# Patient Record
Sex: Male | Born: 1935 | Race: Black or African American | Hispanic: No | Marital: Single | Smoking: Never smoker
Health system: Southern US, Community
[De-identification: ages and names within clinical notes are randomized; demographics above are authoritative.]

## PROBLEM LIST (undated history)

## (undated) DIAGNOSIS — I10 Essential (primary) hypertension: Secondary | ICD-10-CM

## (undated) DIAGNOSIS — M47816 Spondylosis without myelopathy or radiculopathy, lumbar region: Secondary | ICD-10-CM

## (undated) DIAGNOSIS — E349 Endocrine disorder, unspecified: Secondary | ICD-10-CM

## (undated) DIAGNOSIS — I739 Peripheral vascular disease, unspecified: Secondary | ICD-10-CM

## (undated) HISTORY — DX: Essential (primary) hypertension: I10

## (undated) HISTORY — PX: NO PAST SURGERIES: SHX2092

## (undated) HISTORY — DX: Spondylosis without myelopathy or radiculopathy, lumbar region: M47.816

## (undated) HISTORY — DX: Peripheral vascular disease, unspecified: I73.9

## (undated) HISTORY — DX: Endocrine disorder, unspecified: E34.9

---

## 2000-04-23 ENCOUNTER — Encounter: Payer: Self-pay | Admitting: *Deleted

## 2000-04-23 ENCOUNTER — Inpatient Hospital Stay (HOSPITAL_COMMUNITY): Admission: EM | Admit: 2000-04-23 | Discharge: 2000-04-24 | Payer: Self-pay | Admitting: Emergency Medicine

## 2000-04-24 ENCOUNTER — Encounter: Payer: Self-pay | Admitting: *Deleted

## 2000-05-31 ENCOUNTER — Encounter: Payer: Self-pay | Admitting: Endocrinology

## 2000-05-31 ENCOUNTER — Ambulatory Visit (HOSPITAL_COMMUNITY): Admission: RE | Admit: 2000-05-31 | Discharge: 2000-05-31 | Payer: Self-pay | Admitting: Endocrinology

## 2000-06-12 ENCOUNTER — Encounter: Payer: Self-pay | Admitting: Endocrinology

## 2000-06-12 ENCOUNTER — Ambulatory Visit (HOSPITAL_COMMUNITY): Admission: RE | Admit: 2000-06-12 | Discharge: 2000-06-12 | Payer: Self-pay | Admitting: Endocrinology

## 2001-11-07 ENCOUNTER — Encounter: Admission: RE | Admit: 2001-11-07 | Discharge: 2001-11-07 | Payer: Self-pay | Admitting: *Deleted

## 2001-11-07 ENCOUNTER — Encounter: Payer: Self-pay | Admitting: *Deleted

## 2001-11-13 ENCOUNTER — Encounter: Payer: Self-pay | Admitting: Endocrinology

## 2001-11-13 ENCOUNTER — Encounter: Admission: RE | Admit: 2001-11-13 | Discharge: 2001-11-13 | Payer: Self-pay | Admitting: Endocrinology

## 2007-07-19 ENCOUNTER — Ambulatory Visit (HOSPITAL_COMMUNITY): Admission: RE | Admit: 2007-07-19 | Discharge: 2007-07-19 | Payer: Self-pay | Admitting: *Deleted

## 2007-07-19 ENCOUNTER — Encounter (INDEPENDENT_AMBULATORY_CARE_PROVIDER_SITE_OTHER): Payer: Self-pay | Admitting: *Deleted

## 2010-08-16 NOTE — Op Note (Signed)
[name], [name]              ACCOUNT NO.:  [id number]   MEDICAL RECORD NO.:  [id number]          PATIENT TYPE:  AMB   LOCATION:  ENDO                         FACILITY:  [facility]   PHYSICIAN:  Georgiana Spinner, M.D.    DATE OF BIRTH:  [birth year]   DATE OF PROCEDURE:  07/19/2007  DATE OF DISCHARGE:                               OPERATIVE REPORT   PROCEDURE:  Upper endoscopy.   ENDOSCOPIST:  Georgiana Spinner, M.D.   INDICATIONS:  GERD.   ANESTHESIA:  Fentanyl 50 mcg, Versed 4 mg.   PROCEDURE:  With the patient mildly sedated in the left lateral  decubitus position, the Pentax videoscopic endoscope was inserted in the  mouth and passed under direct vision through the esophagus, which  appeared normal, and into the stomach; fundus, body and antrum appeared  normal, as did the duodenal bulb and second portion of duodenum.  From  this point, the endoscope was slowly withdrawn, taking circumferential  views of the duodenal mucosa, until the endoscope had been pulled back  into stomach placed in retroflexion to view the stomach from below.  The  endoscope was straightened and withdrawn, taking circumferential views  of the remaining gastric and esophageal mucosa.  The patient vital signs  and pulse oximetry remained stable.  The patient tolerated procedure  well and without apparent complications.   FINDINGS:  Negative exam.   PLAN:  Proceed to colonoscopy.           ______________________________  Georgiana Spinner, M.D.     GMO/MEDQ  D:  07/19/2007  T:  07/19/2007  Job:  [id number]

## 2010-08-16 NOTE — Op Note (Signed)
John Rose, John Rose              ACCOUNT NO.:  [id number]   MEDICAL RECORD NO.:  [id number]          PATIENT TYPE:  AMB   LOCATION:  ENDO                         FACILITY:  [facility]   PHYSICIAN:  Georgiana Spinner, M.D.    DATE OF BIRTH:  [birth year]   DATE OF PROCEDURE:  DATE OF DISCHARGE:                               OPERATIVE REPORT   PROCEDURE:  Colonoscopy.   ANESTHESIA:  Fentanyl 25 mcg, Versed 1 mg.   INDICATIONS:  Colon polyps   PROCEDURE:  With the patient mildly sedated in the left lateral  decubitus position, the Pentax videoscopic colonoscope was inserted in  the rectum after a limited rectal examination was performed which was  unremarkable.  The endoscope was passed under direct vision with  pressure applied and the patient rolled to his back and subsequently to  his right side. We  were able to reach the cecum identified by ileocecal  valve and appendiceal orifice, both of which were photographed. From  this point, the colonoscope was slowly withdrawn taking circumferential  views of the colonic mucosa stopping only in the ascending colon where  three polyps were seen, the first and third were photographed and each  was removed, the first and third with snare cautery technique, the  middle with hot biopsy forceps technique.  All with a setting of 20/150  blended current.  The first polyp not able to be retrieved, this had  fell into the cecum.  We could not reach back in again to get that, but  the others were retrieved and the endoscope was then withdrawn taking  circumferential views of the remaining colonic mucosa stopping in the  rectum which appeared normal on direct and showed hemorrhoids on  retroflexed view. The endoscope was straightened and withdrawn.  The  patient's vital signs and pulse oximeter remained stable.  The patient  tolerated the procedure well without apparent complications.   FINDINGS:  Internal hemorrhoids.  Polyps of the ascending colon all  removed.  Await biopsy reports.  The patient will call me for results  and follow-up with me as an outpatient.           ______________________________  Georgiana Spinner, M.D.     GMO/MEDQ  D:  07/19/2007  T:  07/19/2007  Job:  [id number]

## 2010-08-19 NOTE — H&P (Signed)
[place]  Patient:    John Rose, John Rose                       MRN: [id number] Adm. Date:  [id number] Attending:  Meade Maw A Dictator:   Anselm Lis, N.P. CC:         Eula Listen, M.D.                         History and Physical  DATE OF BIRTH:  [birth year]  CHIEF COMPLAINT:  (As dictated by Dr. Meade Maw):  Chest pain, atypical, with associated shortness of breath and fatigue.  Onset of symptoms three days prior to admission.  His first CPK is elevated at 1000, but negative significant MB fraction and negative troponin I.  His electrocardiogram is nonischemic.  His cardiac risk factors are male sex, age, dyslipidemia.  He is currently pain-free.  His chest discomfort is experienced as a mid-chest burning which he attributes to reflux, and occurs postprandial.  He has a history of benign prostatic hypertrophy and gastroesophageal reflux disease, thrombocytopenia with leukopenia, with WBC of 3.7 with platelets decreased at 135, will monitor for now, and dyslipidemia.  Question asthma with expiratory wheezing on examination.  The patient denies a history of asthma, cancer, diabetes mellitus, or hypertension.  PLAN:  (As dictated by Dr. Fraser Din): 1. Admit to telemetry.  Rule out myocardial infarction protocol with    serial cardiac enzymes and daily electrocardiogram.  Will initiate    aspirin and possibly topical nitrates. 2. Stress Cardiolyte in the morning if he rules out. 3. Will initiate Xopenex 1.25 mg q.6h.  HISTORY OF PRESENT ILLNESS:  John Rose is a very pleasant 77 year old male, with a history of dyslipidemia, who has a history of postprandial epigastric soreness for approximately one year if he eats greasy foods.  Three days prior to admission he developed progressive shortness of breath/fatigue, and more constant epigastric soreness, with associated palpitations.  He mentioned symptoms of orthopnea over this  weekend as well.  He developed fatigue to the point where it was difficult to dress himself for church yesterday morning. He presented to [facility], who referred the patient to the [facility] Emergency Room.  His electrocardiogram was nonischemic. His first set of troponin Is were negative.  The CPK was elevated without significant MB fraction.  He is currently pain-free unless he coughs, then precipitates epigastric soreness.  PAST SURGICAL HISTORY:  Negative.  ALLERGIES:  No known drug allergies.  CURRENT MEDICATIONS:  Lipitor 10 mg p.o. q.d.  SOCIAL HISTORY/HABITS:  The patient is single.  He has one son alive and well. He works as a Lawyer.  FAMILY HISTORY:  Mother died at age 88.  Father died at age 58.  No coronary artery disease.  One sister died in her 37s.  One sister died at age 63 of unknown causes.  One sister died in her 43s.  One sister died at age 81 of cancer.  One brother and a sister are alive and well.  REVIEW OF SYSTEMS:  As in the HPI and past medical history, otherwise essentially benign.  Does wear glasses.  He is a little hard of hearing. Denies melena, diarrhea, but does have episodic constipation.  Negative bright red blood per rectum.  Denies dysuria or hematuria.  Has had a productive cough of yellow secretions.  PHYSICAL EXAMINATION:  (As performed  by Dr. Meade Maw):  VITAL SIGNS:  Blood pressure 122/80, heart rate tachy at 102.  Temperature 99.6, room air O2 saturation 94%.  GENERAL:  He is a well-nourished gentleman, looking younger than his 77 years. He is alert and without distress.  HEENT/NECK:  Brisk bilateral carotid upstrokes without bruit.  No significant jugular venous distention, no thyromegaly.  LUNGS:  Prolonged expiratory phase with some expiratory wheezing throughout.  CARDIAC:  A regular rate and rhythm without murmur, rub, or gallop.  Normal S1, S2.  ABDOMEN:  Soft, nondistended.   Normoactive bowel sounds.  Negative abdominal aortic, renal, or femoral bruits.  EXTREMITIES:  Distal pulses intact.  Negative pedal edema.  NEUROLOGIC:  Cranial nerves II-XII grossly intact.  Alert and oriented x 3.  GENITOURINARY:  Deferred.  RECTAL:  Deferred.  LABORATORY DATA:  Hemoglobin 15.3, with hematocrit of 45.6, WBC 3.7, platelets decreased at 135.  CPK 1003, with MB fraction 6.5, troponin 0.01.  ABG reveals a pH of 7.42, PACO2 of 36, PAO2 of 67%, O2 saturation 95%.  Electrocardiogram revealed normal sinus rhythm without ischemic changes. There is a QS pattern in V1 and V2, suspicious for an old septal myocardial infarction. DD:  04/24/00 TD:  04/24/00 Job: [id number]

## 2011-09-06 DIAGNOSIS — E291 Testicular hypofunction: Secondary | ICD-10-CM | POA: Diagnosis not present

## 2011-09-06 DIAGNOSIS — E789 Disorder of lipoprotein metabolism, unspecified: Secondary | ICD-10-CM | POA: Diagnosis not present

## 2011-09-06 DIAGNOSIS — Z125 Encounter for screening for malignant neoplasm of prostate: Secondary | ICD-10-CM | POA: Diagnosis not present

## 2011-09-13 DIAGNOSIS — E789 Disorder of lipoprotein metabolism, unspecified: Secondary | ICD-10-CM | POA: Diagnosis not present

## 2011-09-13 DIAGNOSIS — M79609 Pain in unspecified limb: Secondary | ICD-10-CM | POA: Diagnosis not present

## 2011-09-13 DIAGNOSIS — E291 Testicular hypofunction: Secondary | ICD-10-CM | POA: Diagnosis not present

## 2011-09-13 DIAGNOSIS — N400 Enlarged prostate without lower urinary tract symptoms: Secondary | ICD-10-CM | POA: Diagnosis not present

## 2011-11-13 DIAGNOSIS — E291 Testicular hypofunction: Secondary | ICD-10-CM | POA: Diagnosis not present

## 2011-11-17 DIAGNOSIS — E291 Testicular hypofunction: Secondary | ICD-10-CM | POA: Diagnosis not present

## 2011-11-17 DIAGNOSIS — N400 Enlarged prostate without lower urinary tract symptoms: Secondary | ICD-10-CM | POA: Diagnosis not present

## 2011-11-17 DIAGNOSIS — E789 Disorder of lipoprotein metabolism, unspecified: Secondary | ICD-10-CM | POA: Diagnosis not present

## 2012-02-15 DIAGNOSIS — E291 Testicular hypofunction: Secondary | ICD-10-CM | POA: Diagnosis not present

## 2012-02-15 DIAGNOSIS — E789 Disorder of lipoprotein metabolism, unspecified: Secondary | ICD-10-CM | POA: Diagnosis not present

## 2012-02-22 DIAGNOSIS — N400 Enlarged prostate without lower urinary tract symptoms: Secondary | ICD-10-CM | POA: Diagnosis not present

## 2012-02-22 DIAGNOSIS — E291 Testicular hypofunction: Secondary | ICD-10-CM | POA: Diagnosis not present

## 2012-02-22 DIAGNOSIS — E789 Disorder of lipoprotein metabolism, unspecified: Secondary | ICD-10-CM | POA: Diagnosis not present

## 2012-04-05 DIAGNOSIS — N400 Enlarged prostate without lower urinary tract symptoms: Secondary | ICD-10-CM | POA: Diagnosis not present

## 2012-04-05 DIAGNOSIS — E789 Disorder of lipoprotein metabolism, unspecified: Secondary | ICD-10-CM | POA: Diagnosis not present

## 2012-04-05 DIAGNOSIS — IMO0002 Reserved for concepts with insufficient information to code with codable children: Secondary | ICD-10-CM | POA: Diagnosis not present

## 2012-06-13 DIAGNOSIS — E291 Testicular hypofunction: Secondary | ICD-10-CM | POA: Diagnosis not present

## 2012-06-20 DIAGNOSIS — E291 Testicular hypofunction: Secondary | ICD-10-CM | POA: Diagnosis not present

## 2012-09-09 DIAGNOSIS — Z79899 Other long term (current) drug therapy: Secondary | ICD-10-CM | POA: Diagnosis not present

## 2012-09-09 DIAGNOSIS — E291 Testicular hypofunction: Secondary | ICD-10-CM | POA: Diagnosis not present

## 2012-09-09 DIAGNOSIS — Z125 Encounter for screening for malignant neoplasm of prostate: Secondary | ICD-10-CM | POA: Diagnosis not present

## 2012-09-09 DIAGNOSIS — E789 Disorder of lipoprotein metabolism, unspecified: Secondary | ICD-10-CM | POA: Diagnosis not present

## 2012-09-16 DIAGNOSIS — N400 Enlarged prostate without lower urinary tract symptoms: Secondary | ICD-10-CM | POA: Diagnosis not present

## 2012-09-16 DIAGNOSIS — E789 Disorder of lipoprotein metabolism, unspecified: Secondary | ICD-10-CM | POA: Diagnosis not present

## 2012-09-16 DIAGNOSIS — E291 Testicular hypofunction: Secondary | ICD-10-CM | POA: Diagnosis not present

## 2013-03-11 DIAGNOSIS — E291 Testicular hypofunction: Secondary | ICD-10-CM | POA: Diagnosis not present

## 2013-03-11 DIAGNOSIS — E789 Disorder of lipoprotein metabolism, unspecified: Secondary | ICD-10-CM | POA: Diagnosis not present

## 2013-03-11 DIAGNOSIS — N189 Chronic kidney disease, unspecified: Secondary | ICD-10-CM | POA: Diagnosis not present

## 2013-03-18 DIAGNOSIS — N400 Enlarged prostate without lower urinary tract symptoms: Secondary | ICD-10-CM | POA: Diagnosis not present

## 2013-03-18 DIAGNOSIS — Z23 Encounter for immunization: Secondary | ICD-10-CM | POA: Diagnosis not present

## 2013-03-18 DIAGNOSIS — E789 Disorder of lipoprotein metabolism, unspecified: Secondary | ICD-10-CM | POA: Diagnosis not present

## 2013-03-18 DIAGNOSIS — E291 Testicular hypofunction: Secondary | ICD-10-CM | POA: Diagnosis not present

## 2013-04-08 ENCOUNTER — Ambulatory Visit (INDEPENDENT_AMBULATORY_CARE_PROVIDER_SITE_OTHER): Payer: Medicare Other | Admitting: Diagnostic Neuroimaging

## 2013-04-08 ENCOUNTER — Ambulatory Visit (INDEPENDENT_AMBULATORY_CARE_PROVIDER_SITE_OTHER): Payer: Self-pay | Admitting: Radiology

## 2013-04-08 DIAGNOSIS — R202 Paresthesia of skin: Principal | ICD-10-CM

## 2013-04-08 DIAGNOSIS — G5600 Carpal tunnel syndrome, unspecified upper limb: Secondary | ICD-10-CM

## 2013-04-08 DIAGNOSIS — Z0289 Encounter for other administrative examinations: Secondary | ICD-10-CM

## 2013-04-08 DIAGNOSIS — R200 Anesthesia of skin: Secondary | ICD-10-CM

## 2013-04-08 DIAGNOSIS — R209 Unspecified disturbances of skin sensation: Secondary | ICD-10-CM

## 2013-04-09 NOTE — Procedures (Signed)
   [facility]  NCS (NERVE CONDUCTION STUDY) WITH EMG (ELECTROMYOGRAPHY) REPORT   STUDY DATE: 04/08/13 PATIENT NAME: John Rose DOB: 08/05/1922 MRN: [id number]  ORDERING CLINICIAN: Annice PihW Kohut MD  TECHNOLOGIST: Kaylyn LimSue Fox ELECTROMYOGRAPHER: Glenford BayleyVikram R. Tamar Lipscomb, MD  CLINICAL INFORMATION: 80 year old male with left greater than right hand numbness and tingling, for past 1 month. Denies any neck pain.  FINDINGS: NERVE CONDUCTION STUDY: Bilateral median motor responses have prolonged distal latencies (right 7.5 ms, left 7.5 ms, normal less than or 4.2 ms), normal amplitudes, normal conduction velocities and prolonged F-wave latencies. Bilateral ulnar motor responses have normal distal latencies, amplitudes, conduction velocities. Right ulnar F wave latency is slightly prolonged. Left ulnar F wave latency is normal. In  Right median sensory response has decreased amplitude and slow conduction velocity (20 m/s). Left median sensory response could not be obtained. Bilateral ulnar and left radial sensory responses have decreased amplitudes and normal conduction velocities. Right radial sensory response is normal.  NEEDLE ELECTROMYOGRAPHY: Needle examination of selected muscles of the left upper extremity (deltoid, biceps, triceps, flexor carpi radialis, first dorsal interosseous) shows no abnormal spontaneous activity at rest and normal motor unit recruitment on exertion.  IMPRESSION:  Abnormal study demonstrating: 1. Bilateral median neuropathies at the wrists (left worse than right) consistent with bilateral carpal tunnel syndrome. 2. Mild abnormalities of bilateral ulnar and left radial sensory responses (with decreased amplitudes and normal conduction velocities) raises possibility of mild concomitant underlying sensory polyneuropathy.   INTERPRETING PHYSICIAN:  Suanne MarkerVIKRAM R. Namish Krise, MD Certified in Neurology, Neurophysiology and Neuroimaging  [facility] [address], [address] [place], [place] [address] [phone]

## 2013-04-28 DIAGNOSIS — R209 Unspecified disturbances of skin sensation: Secondary | ICD-10-CM | POA: Diagnosis not present

## 2013-04-28 DIAGNOSIS — G5600 Carpal tunnel syndrome, unspecified upper limb: Secondary | ICD-10-CM | POA: Diagnosis not present

## 2013-04-28 DIAGNOSIS — E291 Testicular hypofunction: Secondary | ICD-10-CM | POA: Diagnosis not present

## 2013-04-28 DIAGNOSIS — E789 Disorder of lipoprotein metabolism, unspecified: Secondary | ICD-10-CM | POA: Diagnosis not present

## 2013-04-29 DIAGNOSIS — R209 Unspecified disturbances of skin sensation: Secondary | ICD-10-CM | POA: Diagnosis not present

## 2013-04-30 DIAGNOSIS — G5600 Carpal tunnel syndrome, unspecified upper limb: Secondary | ICD-10-CM | POA: Diagnosis not present

## 2013-05-12 DIAGNOSIS — E789 Disorder of lipoprotein metabolism, unspecified: Secondary | ICD-10-CM | POA: Diagnosis not present

## 2013-05-12 DIAGNOSIS — G589 Mononeuropathy, unspecified: Secondary | ICD-10-CM | POA: Diagnosis not present

## 2013-05-26 DIAGNOSIS — G589 Mononeuropathy, unspecified: Secondary | ICD-10-CM | POA: Diagnosis not present

## 2013-06-09 DIAGNOSIS — E789 Disorder of lipoprotein metabolism, unspecified: Secondary | ICD-10-CM | POA: Diagnosis not present

## 2013-06-09 DIAGNOSIS — G589 Mononeuropathy, unspecified: Secondary | ICD-10-CM | POA: Diagnosis not present

## 2013-06-09 DIAGNOSIS — E291 Testicular hypofunction: Secondary | ICD-10-CM | POA: Diagnosis not present

## 2013-07-25 DIAGNOSIS — G5600 Carpal tunnel syndrome, unspecified upper limb: Secondary | ICD-10-CM | POA: Diagnosis not present

## 2013-08-21 DIAGNOSIS — Z79899 Other long term (current) drug therapy: Secondary | ICD-10-CM | POA: Diagnosis not present

## 2013-08-21 DIAGNOSIS — L708 Other acne: Secondary | ICD-10-CM | POA: Diagnosis not present

## 2013-08-21 DIAGNOSIS — R609 Edema, unspecified: Secondary | ICD-10-CM | POA: Diagnosis not present

## 2013-09-10 DIAGNOSIS — Z125 Encounter for screening for malignant neoplasm of prostate: Secondary | ICD-10-CM | POA: Diagnosis not present

## 2013-09-10 DIAGNOSIS — Z79899 Other long term (current) drug therapy: Secondary | ICD-10-CM | POA: Diagnosis not present

## 2013-09-10 DIAGNOSIS — N189 Chronic kidney disease, unspecified: Secondary | ICD-10-CM | POA: Diagnosis not present

## 2013-09-10 DIAGNOSIS — E789 Disorder of lipoprotein metabolism, unspecified: Secondary | ICD-10-CM | POA: Diagnosis not present

## 2013-09-17 DIAGNOSIS — E789 Disorder of lipoprotein metabolism, unspecified: Secondary | ICD-10-CM | POA: Diagnosis not present

## 2013-09-17 DIAGNOSIS — R609 Edema, unspecified: Secondary | ICD-10-CM | POA: Diagnosis not present

## 2013-09-17 DIAGNOSIS — D649 Anemia, unspecified: Secondary | ICD-10-CM | POA: Diagnosis not present

## 2013-09-17 DIAGNOSIS — E291 Testicular hypofunction: Secondary | ICD-10-CM | POA: Diagnosis not present

## 2013-09-17 DIAGNOSIS — G589 Mononeuropathy, unspecified: Secondary | ICD-10-CM | POA: Diagnosis not present

## 2013-10-21 DIAGNOSIS — R609 Edema, unspecified: Secondary | ICD-10-CM | POA: Diagnosis not present

## 2013-10-21 DIAGNOSIS — M169 Osteoarthritis of hip, unspecified: Secondary | ICD-10-CM | POA: Diagnosis not present

## 2013-10-21 DIAGNOSIS — E789 Disorder of lipoprotein metabolism, unspecified: Secondary | ICD-10-CM | POA: Diagnosis not present

## 2013-10-21 DIAGNOSIS — M1610 Unilateral primary osteoarthritis, unspecified hip: Secondary | ICD-10-CM | POA: Diagnosis not present

## 2013-10-21 DIAGNOSIS — M79609 Pain in unspecified limb: Secondary | ICD-10-CM | POA: Diagnosis not present

## 2014-01-08 DIAGNOSIS — E291 Testicular hypofunction: Secondary | ICD-10-CM | POA: Diagnosis not present

## 2014-01-08 DIAGNOSIS — R202 Paresthesia of skin: Secondary | ICD-10-CM | POA: Diagnosis not present

## 2014-01-08 DIAGNOSIS — E789 Disorder of lipoprotein metabolism, unspecified: Secondary | ICD-10-CM | POA: Diagnosis not present

## 2014-01-15 DIAGNOSIS — E291 Testicular hypofunction: Secondary | ICD-10-CM | POA: Diagnosis not present

## 2014-01-15 DIAGNOSIS — E789 Disorder of lipoprotein metabolism, unspecified: Secondary | ICD-10-CM | POA: Diagnosis not present

## 2014-01-15 DIAGNOSIS — M1990 Unspecified osteoarthritis, unspecified site: Secondary | ICD-10-CM | POA: Diagnosis not present

## 2014-05-12 DIAGNOSIS — E789 Disorder of lipoprotein metabolism, unspecified: Secondary | ICD-10-CM | POA: Diagnosis not present

## 2014-05-19 DIAGNOSIS — E789 Disorder of lipoprotein metabolism, unspecified: Secondary | ICD-10-CM | POA: Diagnosis not present

## 2014-05-19 DIAGNOSIS — E291 Testicular hypofunction: Secondary | ICD-10-CM | POA: Diagnosis not present

## 2014-06-02 DIAGNOSIS — L723 Sebaceous cyst: Secondary | ICD-10-CM | POA: Diagnosis not present

## 2014-09-14 DIAGNOSIS — D649 Anemia, unspecified: Secondary | ICD-10-CM | POA: Diagnosis not present

## 2014-09-14 DIAGNOSIS — Z79899 Other long term (current) drug therapy: Secondary | ICD-10-CM | POA: Diagnosis not present

## 2014-09-14 DIAGNOSIS — Z125 Encounter for screening for malignant neoplasm of prostate: Secondary | ICD-10-CM | POA: Diagnosis not present

## 2014-09-14 DIAGNOSIS — E756 Lipid storage disorder, unspecified: Secondary | ICD-10-CM | POA: Diagnosis not present

## 2014-09-22 DIAGNOSIS — N400 Enlarged prostate without lower urinary tract symptoms: Secondary | ICD-10-CM | POA: Diagnosis not present

## 2014-09-22 DIAGNOSIS — G629 Polyneuropathy, unspecified: Secondary | ICD-10-CM | POA: Diagnosis not present

## 2014-09-22 DIAGNOSIS — E789 Disorder of lipoprotein metabolism, unspecified: Secondary | ICD-10-CM | POA: Diagnosis not present

## 2014-09-22 DIAGNOSIS — E291 Testicular hypofunction: Secondary | ICD-10-CM | POA: Diagnosis not present

## 2014-12-15 DIAGNOSIS — E789 Disorder of lipoprotein metabolism, unspecified: Secondary | ICD-10-CM | POA: Diagnosis not present

## 2014-12-15 DIAGNOSIS — E291 Testicular hypofunction: Secondary | ICD-10-CM | POA: Diagnosis not present

## 2014-12-15 DIAGNOSIS — N400 Enlarged prostate without lower urinary tract symptoms: Secondary | ICD-10-CM | POA: Diagnosis not present

## 2014-12-22 DIAGNOSIS — G629 Polyneuropathy, unspecified: Secondary | ICD-10-CM | POA: Diagnosis not present

## 2014-12-22 DIAGNOSIS — M25579 Pain in unspecified ankle and joints of unspecified foot: Secondary | ICD-10-CM | POA: Diagnosis not present

## 2014-12-22 DIAGNOSIS — E789 Disorder of lipoprotein metabolism, unspecified: Secondary | ICD-10-CM | POA: Diagnosis not present

## 2014-12-23 ENCOUNTER — Other Ambulatory Visit: Payer: Self-pay | Admitting: Endocrinology

## 2014-12-23 ENCOUNTER — Ambulatory Visit (HOSPITAL_COMMUNITY)
Admission: RE | Admit: 2014-12-23 | Discharge: 2014-12-23 | Disposition: A | Discharge status: Home / Self Care | Payer: Medicare Other | Source: Ambulatory Visit | Attending: Vascular Surgery | Admitting: Vascular Surgery

## 2014-12-23 DIAGNOSIS — R609 Edema, unspecified: Secondary | ICD-10-CM | POA: Diagnosis not present

## 2015-01-20 DIAGNOSIS — G629 Polyneuropathy, unspecified: Secondary | ICD-10-CM | POA: Diagnosis not present

## 2015-01-20 DIAGNOSIS — Z23 Encounter for immunization: Secondary | ICD-10-CM | POA: Diagnosis not present

## 2015-01-20 DIAGNOSIS — N390 Urinary tract infection, site not specified: Secondary | ICD-10-CM | POA: Diagnosis not present

## 2015-04-27 DIAGNOSIS — N400 Enlarged prostate without lower urinary tract symptoms: Secondary | ICD-10-CM | POA: Diagnosis not present

## 2015-04-27 DIAGNOSIS — E789 Disorder of lipoprotein metabolism, unspecified: Secondary | ICD-10-CM | POA: Diagnosis not present

## 2015-04-27 DIAGNOSIS — E291 Testicular hypofunction: Secondary | ICD-10-CM | POA: Diagnosis not present

## 2015-06-10 ENCOUNTER — Ambulatory Visit (INDEPENDENT_AMBULATORY_CARE_PROVIDER_SITE_OTHER): Payer: Medicare Other | Admitting: Diagnostic Neuroimaging

## 2015-06-10 ENCOUNTER — Encounter (INDEPENDENT_AMBULATORY_CARE_PROVIDER_SITE_OTHER): Payer: Self-pay | Admitting: Diagnostic Neuroimaging

## 2015-06-10 DIAGNOSIS — G5602 Carpal tunnel syndrome, left upper limb: Secondary | ICD-10-CM | POA: Diagnosis not present

## 2015-06-10 DIAGNOSIS — G5603 Carpal tunnel syndrome, bilateral upper limbs: Secondary | ICD-10-CM

## 2015-06-10 DIAGNOSIS — G5601 Carpal tunnel syndrome, right upper limb: Secondary | ICD-10-CM | POA: Diagnosis not present

## 2015-06-10 DIAGNOSIS — Z0289 Encounter for other administrative examinations: Secondary | ICD-10-CM

## 2015-06-11 NOTE — Procedures (Signed)
   [facility]  NCS (NERVE CONDUCTION STUDY) WITH EMG (ELECTROMYOGRAPHY) REPORT   STUDY DATE: 06/10/15 PATIENT NAME: John Rose DOB: 08/25/1922 MRN: [id number]  ORDERING CLINICIAN: Brooke BonitoW D Kohut  TECHNOLOGIST: Gearldine ShownLorraine Jones  ELECTROMYOGRAPHER: Glenford BayleyVikram R. Hiliary Osorto, MD  CLINICAL INFORMATION: 82 year old male with bilateral hand numbness. Patient denies any neck pain or radiating symptoms into the arms from the neck.   FINDINGS: NERVE CONDUCTION STUDY: Bilateral median motor responses have prolonged distal latencies (right 9.1 ms, left 9.5 ms), normal amplitudes, normal conduction velocities and prolonged F-wave latencies. Bilateral ulnar motor responses and F wave latencies are normal. Bilateral median sensory responses could not be obtained. Bilateral ulnar sensory responses are normal.   NEEDLE ELECTROMYOGRAPHY: Needle examination of left upper extremity deltoid, biceps, triceps, flexor carpi radialis, first dorsal interosseous muscles is normal.   IMPRESSION:  Abnormal study demonstrating: - Moderate to severe bilateral median neuropathies at the wrist consistent with bilateral carpal tunnel syndrome.    INTERPRETING PHYSICIAN:  Suanne MarkerVIKRAM R. Arben Packman, MD Certified in Neurology, Neurophysiology and Neuroimaging  [facility] [address], [address] [place], [place] [address] [phone]

## 2015-09-21 DIAGNOSIS — E789 Disorder of lipoprotein metabolism, unspecified: Secondary | ICD-10-CM | POA: Diagnosis not present

## 2015-09-21 DIAGNOSIS — R202 Paresthesia of skin: Secondary | ICD-10-CM | POA: Diagnosis not present

## 2015-09-21 DIAGNOSIS — Z79899 Other long term (current) drug therapy: Secondary | ICD-10-CM | POA: Diagnosis not present

## 2015-09-21 DIAGNOSIS — E291 Testicular hypofunction: Secondary | ICD-10-CM | POA: Diagnosis not present

## 2015-09-21 DIAGNOSIS — D649 Anemia, unspecified: Secondary | ICD-10-CM | POA: Diagnosis not present

## 2015-09-28 DIAGNOSIS — N400 Enlarged prostate without lower urinary tract symptoms: Secondary | ICD-10-CM | POA: Diagnosis not present

## 2015-09-28 DIAGNOSIS — E789 Disorder of lipoprotein metabolism, unspecified: Secondary | ICD-10-CM | POA: Diagnosis not present

## 2015-09-28 DIAGNOSIS — E291 Testicular hypofunction: Secondary | ICD-10-CM | POA: Diagnosis not present

## 2015-09-28 DIAGNOSIS — G629 Polyneuropathy, unspecified: Secondary | ICD-10-CM | POA: Diagnosis not present

## 2015-11-21 DIAGNOSIS — Z23 Encounter for immunization: Secondary | ICD-10-CM | POA: Diagnosis not present

## 2015-12-03 ENCOUNTER — Other Ambulatory Visit: Payer: Self-pay

## 2016-01-20 DIAGNOSIS — E789 Disorder of lipoprotein metabolism, unspecified: Secondary | ICD-10-CM | POA: Diagnosis not present

## 2016-01-20 DIAGNOSIS — D649 Anemia, unspecified: Secondary | ICD-10-CM | POA: Diagnosis not present

## 2016-01-27 DIAGNOSIS — N400 Enlarged prostate without lower urinary tract symptoms: Secondary | ICD-10-CM | POA: Diagnosis not present

## 2016-01-27 DIAGNOSIS — G5600 Carpal tunnel syndrome, unspecified upper limb: Secondary | ICD-10-CM | POA: Diagnosis not present

## 2016-01-27 DIAGNOSIS — E789 Disorder of lipoprotein metabolism, unspecified: Secondary | ICD-10-CM | POA: Diagnosis not present

## 2016-05-23 DIAGNOSIS — E789 Disorder of lipoprotein metabolism, unspecified: Secondary | ICD-10-CM | POA: Diagnosis not present

## 2016-05-30 DIAGNOSIS — G5600 Carpal tunnel syndrome, unspecified upper limb: Secondary | ICD-10-CM | POA: Diagnosis not present

## 2016-05-30 DIAGNOSIS — E291 Testicular hypofunction: Secondary | ICD-10-CM | POA: Diagnosis not present

## 2016-05-30 DIAGNOSIS — E789 Disorder of lipoprotein metabolism, unspecified: Secondary | ICD-10-CM | POA: Diagnosis not present

## 2016-09-05 DIAGNOSIS — E789 Disorder of lipoprotein metabolism, unspecified: Secondary | ICD-10-CM | POA: Diagnosis not present

## 2016-09-05 DIAGNOSIS — M5136 Other intervertebral disc degeneration, lumbar region: Secondary | ICD-10-CM | POA: Diagnosis not present

## 2016-09-05 DIAGNOSIS — N400 Enlarged prostate without lower urinary tract symptoms: Secondary | ICD-10-CM | POA: Diagnosis not present

## 2016-09-05 DIAGNOSIS — M549 Dorsalgia, unspecified: Secondary | ICD-10-CM | POA: Diagnosis not present

## 2016-09-05 DIAGNOSIS — E291 Testicular hypofunction: Secondary | ICD-10-CM | POA: Diagnosis not present

## 2016-09-05 DIAGNOSIS — M545 Low back pain: Secondary | ICD-10-CM | POA: Diagnosis not present

## 2016-09-05 DIAGNOSIS — T887XXA Unspecified adverse effect of drug or medicament, initial encounter: Secondary | ICD-10-CM | POA: Diagnosis not present

## 2016-09-05 DIAGNOSIS — M6281 Muscle weakness (generalized): Secondary | ICD-10-CM | POA: Diagnosis not present

## 2016-09-20 DIAGNOSIS — N400 Enlarged prostate without lower urinary tract symptoms: Secondary | ICD-10-CM | POA: Diagnosis not present

## 2016-09-20 DIAGNOSIS — G629 Polyneuropathy, unspecified: Secondary | ICD-10-CM | POA: Diagnosis not present

## 2016-09-20 DIAGNOSIS — E789 Disorder of lipoprotein metabolism, unspecified: Secondary | ICD-10-CM | POA: Diagnosis not present

## 2016-10-27 DIAGNOSIS — M545 Low back pain: Secondary | ICD-10-CM | POA: Diagnosis not present

## 2016-10-27 DIAGNOSIS — R634 Abnormal weight loss: Secondary | ICD-10-CM | POA: Diagnosis not present

## 2016-11-10 ENCOUNTER — Encounter: Payer: Self-pay | Admitting: Neurology

## 2016-11-10 ENCOUNTER — Ambulatory Visit (INDEPENDENT_AMBULATORY_CARE_PROVIDER_SITE_OTHER): Payer: Medicare Other | Admitting: Neurology

## 2016-11-10 DIAGNOSIS — M47816 Spondylosis without myelopathy or radiculopathy, lumbar region: Secondary | ICD-10-CM | POA: Diagnosis not present

## 2016-11-10 HISTORY — DX: Spondylosis without myelopathy or radiculopathy, lumbar region: M47.816

## 2016-11-10 NOTE — Patient Instructions (Signed)
   We will try a lumbar support device to help the low back discomfort when you are standing for long periods of time.

## 2016-11-10 NOTE — Progress Notes (Signed)
Reason for visit: Weakness  Referring physician: Dr. [name] is a 83 y.o. male  History of present illness:  John Rose is a 83 year old right-handed black male with a history of chronic renal insufficiency and mild pancytopenia. The patient is sent over today for an evaluation of a several month history of reports of feeling weak in the low back with standing. The patient indicates that he is stooped with his posture, if he stands too long he has a discomfort in the low back and he feels that he needs to sit down. The patient denies any pain down the legs on either side, he does not believe there is any true weakness of the legs. He is able to ambulate fairly well when he uses a cane. He does not have the sensation of as much discomfort in the back when he is walking. The patient denies any neck pain or pain down the arms, he denies changes controlling the bowels or the bladder. He has not had any falls. He has noted his legs may swell at times, he is sent to this office for further evaluation. He has undergone a recent x-ray of the abdomen and pelvis that was done on 09/05/2016 that showed moderate to advanced degenerative changes in the hips left greater than right.  Past Medical History:  Diagnosis Date  . Lumbar spondylosis 11/10/2016  . Testosterone deficiency     Past Surgical History:  Procedure Laterality Date  . NO PAST SURGERIES      History reviewed. No pertinent family history.  Social history:  reports that he has never smoked. He has never used smokeless tobacco. He reports that he does not drink alcohol or use drugs.  Medications:  Prior to Admission medications   Medication Sig Start Date End Date Taking? Authorizing Provider  acetaminophen (TYLENOL) 650 MG CR tablet Take 650 mg by mouth every 8 (eight) hours as needed for pain.   Yes [provider]  ANDROGEL PUMP 20.25 MG/ACT (1.62%) GEL Apply 1 Dose topically 4 (four) times daily.  10/25/16   Yes [provider]  furosemide (LASIX) 40 MG tablet Take 20 mg by mouth daily as needed. Take 1-2 tablets if needed   Yes [provider]  gabapentin (NEURONTIN) 300 MG capsule Take 300 mg by mouth 2 (two) times daily. 10/23/16  Yes [provider]  meloxicam (MOBIC) 15 MG tablet take 1 tablet by mouth once daily if needed for pain 10/23/16  Yes [provider]  methocarbamol (ROBAXIN) 500 MG tablet take 1 tablet by mouth twice a day if needed 10/27/16  Yes [provider]  tamsulosin (FLOMAX) 0.4 MG CAPS capsule Take 0.4 mg by mouth daily.  08/16/16  Yes [provider]     No Known Allergies  ROS:  Out of a complete 14 system review of symptoms, the patient complains only of the following symptoms, and all other reviewed systems are negative.  Weight loss Swelling in the legs Hearing loss  Blood pressure 121/70, pulse 79, height 5\' 8"  (1.727 m), weight 166 lb (75.3 kg), SpO2 98 %.  Physical Exam  General: The patient is alert and cooperative at the time of the examination.  Eyes: Pupils are equal, round, and reactive to light. Discs are flat bilaterally.  Neck: The neck is supple, no carotid bruits are noted.  Respiratory: The respiratory examination is clear on the left side, occasional wheezes on the right.  Cardiovascular: The cardiovascular examination reveals  a regular rate and rhythm, no obvious murmurs or rubs are noted.  Skin: Extremities are with 1+ edema to ankles bilaterally, left greater than right.  Neurologic Exam  Mental status: The patient is alert and oriented x 3 at the time of the examination. The patient has apparent normal recent and remote memory, with an apparently normal attention span and concentration ability.  Cranial nerves: Facial symmetry is present. There is good sensation of the face to pinprick and soft touch bilaterally. The strength of the facial muscles and the muscles to head turning and  shoulder shrug are normal bilaterally. Speech is well enunciated, no aphasia or dysarthria is noted. Extraocular movements are full. Visual fields are full. The tongue is midline, and the patient has symmetric elevation of the soft palate. No obvious hearing deficits are noted.  Motor: The motor testing reveals 5 over 5 strength of all 4 extremities. Good symmetric motor tone is noted throughout.  Sensory: Sensory testing is intact to pinprick, soft touch, vibration sensation, and position sense on all 4 extremities, with exception of some decreased vibration sensation in both feet. No evidence of extinction is noted.  Coordination: Cerebellar testing reveals good finger-nose-finger and heel-to-shin bilaterally.  Gait and station: Gait is associated with a stooped posture, the patient walks with a cane, he has relatively good stability. Tandem gait was not attempted. Romberg is negative. No drift is seen. The patient is able to rise from a seated position with arms crossed.  Reflexes: Deep tendon reflexes are symmetric, but are depressed bilaterally. Toes are downgoing bilaterally.   Assessment/Plan:  1. 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 I will give the patient a lumbar support device, we will check back in several months to see how he is doing. We may consider getting an x-ray of the back if he is not getting better. He will follow-up in 4 months.  [name]. [name] MD 11/10/2016 11:11 AM  [facility] [address] [place], [place] [id number]  Phone [phone] Fax [phone]

## 2016-11-17 DIAGNOSIS — D649 Anemia, unspecified: Secondary | ICD-10-CM | POA: Diagnosis not present

## 2016-11-17 DIAGNOSIS — N289 Disorder of kidney and ureter, unspecified: Secondary | ICD-10-CM | POA: Diagnosis not present

## 2016-11-17 DIAGNOSIS — R634 Abnormal weight loss: Secondary | ICD-10-CM | POA: Diagnosis not present

## 2016-12-15 DIAGNOSIS — R634 Abnormal weight loss: Secondary | ICD-10-CM | POA: Diagnosis not present

## 2016-12-15 DIAGNOSIS — E291 Testicular hypofunction: Secondary | ICD-10-CM | POA: Diagnosis not present

## 2016-12-15 DIAGNOSIS — N400 Enlarged prostate without lower urinary tract symptoms: Secondary | ICD-10-CM | POA: Diagnosis not present

## 2016-12-20 ENCOUNTER — Other Ambulatory Visit: Payer: Self-pay | Admitting: Internal Medicine

## 2016-12-20 DIAGNOSIS — R634 Abnormal weight loss: Secondary | ICD-10-CM

## 2016-12-26 ENCOUNTER — Ambulatory Visit
Admission: RE | Admit: 2016-12-26 | Discharge: 2016-12-26 | Disposition: A | Discharge status: Home / Self Care | Payer: Medicare Other | Source: Ambulatory Visit | Attending: Internal Medicine | Admitting: Internal Medicine

## 2016-12-26 DIAGNOSIS — K7689 Other specified diseases of liver: Secondary | ICD-10-CM | POA: Diagnosis not present

## 2016-12-26 DIAGNOSIS — R634 Abnormal weight loss: Secondary | ICD-10-CM

## 2017-01-23 DIAGNOSIS — R634 Abnormal weight loss: Secondary | ICD-10-CM | POA: Diagnosis not present

## 2017-01-23 DIAGNOSIS — Z0000 Encounter for general adult medical examination without abnormal findings: Secondary | ICD-10-CM | POA: Diagnosis not present

## 2017-01-23 DIAGNOSIS — Z23 Encounter for immunization: Secondary | ICD-10-CM | POA: Diagnosis not present

## 2017-01-23 DIAGNOSIS — E789 Disorder of lipoprotein metabolism, unspecified: Secondary | ICD-10-CM | POA: Diagnosis not present

## 2017-01-23 DIAGNOSIS — E291 Testicular hypofunction: Secondary | ICD-10-CM | POA: Diagnosis not present

## 2017-03-22 ENCOUNTER — Ambulatory Visit: Payer: Medicare Other | Admitting: Adult Health

## 2017-03-22 ENCOUNTER — Ambulatory Visit (INDEPENDENT_AMBULATORY_CARE_PROVIDER_SITE_OTHER): Payer: Medicare Other | Admitting: Adult Health

## 2017-03-22 ENCOUNTER — Encounter: Payer: Self-pay | Admitting: Adult Health

## 2017-03-22 VITALS — BP 135/65 | HR 70 | Ht 68.0 in | Wt 157.6 lb

## 2017-03-22 DIAGNOSIS — M47816 Spondylosis without myelopathy or radiculopathy, lumbar region: Secondary | ICD-10-CM | POA: Diagnosis not present

## 2017-03-22 NOTE — Progress Notes (Signed)
I have read the note, and I agree with the clinical assessment and plan.  Tyann Niehaus K Takiah Maiden   

## 2017-03-22 NOTE — Progress Notes (Signed)
PATIENT: John SacksMizell Kellen DOB: 08/19/1922  REASON FOR VISIT: follow up HISTORY FROM: patient  HISTORY OF PRESENT ILLNESS: Today 03/22/17 Mr. John Rose is a 83 year old male with a history of chronic renal insufficiency, mild pancytopenia and lumbar spondylosis.  He returns today for follow-up.  At the last visit he was given a back brace to use for his discomfort.  He reports that he does use it and finds it beneficial.  He denies any weakness in the lower extremities.  Denies any significant changes with his gait or balance.  Reports that he does use a cane when ambulating.  Denies any recent falls.  Denies any changes with his bowels or bladder.  He reports that he has pain and weakness in the lower back but finds that the brace is helpful.  He denies any new neurological symptoms.  He returns today for an evaluation.  HISTORY 11/10/16: Mr. John Rose is a 83 year old right-handed black male with a history of chronic renal insufficiency and mild pancytopenia. The patient is sent over today for an evaluation of a several month history of reports of feeling weak in the low back with standing. The patient indicates that he is stooped with his posture, if he stands too long he has a discomfort in the low back and he feels that he needs to sit down. The patient denies any pain down the legs on either side, he does not believe there is any true weakness of the legs. He is able to ambulate fairly well when he uses a cane. He does not have the sensation of as much discomfort in the back when he is walking. The patient denies any neck pain or pain down the arms, he denies changes controlling the bowels or the bladder. He has not had any falls. He has noted his legs may swell at times, he is sent to this office for further evaluation. He has undergone a recent x-ray of the abdomen and pelvis that was done on 09/05/2016 that showed moderate to advanced degenerative changes in the hips left greater than right.  REVIEW  OF SYSTEMS: Out of a complete 14 system review of symptoms, the patient complains only of the following symptoms, and all other reviewed systems are negative.  Weakness  ALLERGIES: No Known Allergies  HOME MEDICATIONS: Outpatient Medications Prior to Visit  Medication Sig Dispense Refill  . acetaminophen (TYLENOL) 650 MG CR tablet Take 650 mg by mouth every 8 (eight) hours as needed for pain.    . ANDROGEL PUMP 20.25 MG/ACT (1.62%) GEL Apply 1 Dose topically 4 (four) times daily.   0  . furosemide (LASIX) 40 MG tablet Take 20 mg by mouth daily as needed. Take 1-2 tablets if needed    . gabapentin (NEURONTIN) 300 MG capsule Take 300 mg by mouth 2 (two) times daily.  0  . meloxicam (MOBIC) 15 MG tablet take 1 tablet by mouth once daily if needed for pain  0  . methocarbamol (ROBAXIN) 500 MG tablet take 1 tablet by mouth twice a day if needed  0  . tamsulosin (FLOMAX) 0.4 MG CAPS capsule Take 0.4 mg by mouth daily.   0   No facility-administered medications prior to visit.     PAST MEDICAL HISTORY: Past Medical History:  Diagnosis Date  . Lumbar spondylosis 11/10/2016  . Testosterone deficiency     PAST SURGICAL HISTORY: Past Surgical History:  Procedure Laterality Date  . NO PAST SURGERIES      FAMILY HISTORY: History  reviewed. No pertinent family history.  SOCIAL HISTORY: Social History   Socioeconomic History  . Marital status: Single    Spouse name: Not on file  . Number of children: 1  . Years of education: BS  . Highest education level: Not on file  Social Needs  . Financial resource strain: Not on file  . Food insecurity - worry: Not on file  . Food insecurity - inability: Not on file  . Transportation needs - medical: Not on file  . Transportation needs - non-medical: Not on file  Occupational History  . Occupation: Retired  Tobacco Use  . Smoking status: Never Smoker  . Smokeless tobacco: Never Used  Substance and Sexual Activity  . Alcohol use: No  .  Drug use: No  . Sexual activity: Not on file  Other Topics Concern  . Not on file  Social History Narrative   Lives    Caffeine use: Tea, soda (pepsi)      PHYSICAL EXAM  Vitals:   03/22/17 1300  BP: 135/65  Pulse: 70  Weight: 157 lb 9.6 oz (71.5 kg)  Height: 5\' 8"  (1.727 m)   Body mass index is 23.96 kg/m.  Generalized: Well developed, in no acute distress   Neurological examination  Mentation: Alert oriented to time, place, history taking. Follows all commands speech and language fluent Cranial nerve II-XII: Pupils were equal round reactive to light. Extraocular movements were full, visual field were full on confrontational test. Facial sensation and strength were normal. Uvula tongue midline. Head turning and shoulder shrug  were normal and symmetric. Motor: The motor testing reveals 5 over 5 strength of all 4 extremities. Good symmetric motor tone is noted throughout.  Sensory: Sensory testing is intact to soft touch on all 4 extremities. No evidence of extinction is noted.  Coordination: Cerebellar testing reveals good finger-nose-finger and heel-to-shin bilaterally.  Gait and station: Gait is normal. Tandem gait is normal. Romberg is negative. No drift is seen.  Reflexes: Deep tendon reflexes are symmetric and normal bilaterally.   DIAGNOSTIC DATA (LABS, IMAGING, TESTING) - I reviewed patient records, labs, notes, testing and imaging myself where available.  No results found for: WBC, HGB, HCT, MCV, PLT No results found for: NA, K, CL, CO2, GLUCOSE, BUN, CREATININE, CALCIUM, PROT, ALBUMIN, AST, ALT, ALKPHOS, BILITOT, GFRNONAA, GFRAA No results found for: CHOL, HDL, LDLCALC, LDLDIRECT, TRIG, CHOLHDL No results found for: [id number] No results found for: VITAMINB12 No results found for: TSH    ASSESSMENT AND PLAN 83 y.o. year old male  has a past medical history of Lumbar spondylosis (11/10/2016) and Testosterone deficiency. here with :  1.  Lumbar spondylosis 2.   Chronic renal insufficiency  Overall the patient has remained stable.  I have encouraged him to continue using the back brace.  We are currently not prescribing any medication to the patient.  He is advised that if his symptoms worsen or he develops new symptoms he should let us know.  He will follow-up in 6 months or sooner if needed.     Butch PennyMegan Takeyla Million, MSN, NP-C 03/22/2017, 1:09 PM [facility] [address], [address] [place], [place] [address] [phone]

## 2017-03-22 NOTE — Patient Instructions (Signed)
Your Plan:  Continue using the assistive device If your symptoms worsen or you develop new symptoms please let us know.   Thank you for coming to see [facility] at [facility]. I hope we have been able to provide you high quality care today.  You may receive a patient satisfaction survey over the next few weeks. We would appreciate your feedback and comments so that we may continue to improve ourselves and the health of our patients.

## 2017-07-17 DIAGNOSIS — E291 Testicular hypofunction: Secondary | ICD-10-CM | POA: Diagnosis not present

## 2017-07-17 DIAGNOSIS — E789 Disorder of lipoprotein metabolism, unspecified: Secondary | ICD-10-CM | POA: Diagnosis not present

## 2017-07-17 DIAGNOSIS — R634 Abnormal weight loss: Secondary | ICD-10-CM | POA: Diagnosis not present

## 2017-07-24 DIAGNOSIS — D61818 Other pancytopenia: Secondary | ICD-10-CM | POA: Diagnosis not present

## 2017-07-24 DIAGNOSIS — R2681 Unsteadiness on feet: Secondary | ICD-10-CM | POA: Diagnosis not present

## 2017-07-24 DIAGNOSIS — Z0000 Encounter for general adult medical examination without abnormal findings: Secondary | ICD-10-CM | POA: Diagnosis not present

## 2017-07-24 DIAGNOSIS — E291 Testicular hypofunction: Secondary | ICD-10-CM | POA: Diagnosis not present

## 2017-07-24 DIAGNOSIS — E7800 Pure hypercholesterolemia, unspecified: Secondary | ICD-10-CM | POA: Diagnosis not present

## 2017-08-07 DIAGNOSIS — R2681 Unsteadiness on feet: Secondary | ICD-10-CM | POA: Diagnosis not present

## 2017-08-07 DIAGNOSIS — Z9181 History of falling: Secondary | ICD-10-CM | POA: Diagnosis not present

## 2017-08-07 DIAGNOSIS — D61818 Other pancytopenia: Secondary | ICD-10-CM | POA: Diagnosis not present

## 2017-08-07 DIAGNOSIS — R296 Repeated falls: Secondary | ICD-10-CM | POA: Diagnosis not present

## 2017-08-10 DIAGNOSIS — R2681 Unsteadiness on feet: Secondary | ICD-10-CM | POA: Diagnosis not present

## 2017-08-10 DIAGNOSIS — Z9181 History of falling: Secondary | ICD-10-CM | POA: Diagnosis not present

## 2017-08-10 DIAGNOSIS — D61818 Other pancytopenia: Secondary | ICD-10-CM | POA: Diagnosis not present

## 2017-08-10 DIAGNOSIS — R296 Repeated falls: Secondary | ICD-10-CM | POA: Diagnosis not present

## 2017-08-13 DIAGNOSIS — D61818 Other pancytopenia: Secondary | ICD-10-CM | POA: Diagnosis not present

## 2017-08-13 DIAGNOSIS — R296 Repeated falls: Secondary | ICD-10-CM | POA: Diagnosis not present

## 2017-08-13 DIAGNOSIS — Z9181 History of falling: Secondary | ICD-10-CM | POA: Diagnosis not present

## 2017-08-13 DIAGNOSIS — R2681 Unsteadiness on feet: Secondary | ICD-10-CM | POA: Diagnosis not present

## 2017-08-17 DIAGNOSIS — D61818 Other pancytopenia: Secondary | ICD-10-CM | POA: Diagnosis not present

## 2017-08-17 DIAGNOSIS — R296 Repeated falls: Secondary | ICD-10-CM | POA: Diagnosis not present

## 2017-08-17 DIAGNOSIS — R2681 Unsteadiness on feet: Secondary | ICD-10-CM | POA: Diagnosis not present

## 2017-08-17 DIAGNOSIS — Z9181 History of falling: Secondary | ICD-10-CM | POA: Diagnosis not present

## 2017-08-22 DIAGNOSIS — D61818 Other pancytopenia: Secondary | ICD-10-CM | POA: Diagnosis not present

## 2017-08-22 DIAGNOSIS — R2681 Unsteadiness on feet: Secondary | ICD-10-CM | POA: Diagnosis not present

## 2017-08-22 DIAGNOSIS — R296 Repeated falls: Secondary | ICD-10-CM | POA: Diagnosis not present

## 2017-08-22 DIAGNOSIS — Z9181 History of falling: Secondary | ICD-10-CM | POA: Diagnosis not present

## 2017-08-24 DIAGNOSIS — R2681 Unsteadiness on feet: Secondary | ICD-10-CM | POA: Diagnosis not present

## 2017-08-24 DIAGNOSIS — D61818 Other pancytopenia: Secondary | ICD-10-CM | POA: Diagnosis not present

## 2017-08-24 DIAGNOSIS — Z9181 History of falling: Secondary | ICD-10-CM | POA: Diagnosis not present

## 2017-08-24 DIAGNOSIS — R296 Repeated falls: Secondary | ICD-10-CM | POA: Diagnosis not present

## 2017-08-29 DIAGNOSIS — R296 Repeated falls: Secondary | ICD-10-CM | POA: Diagnosis not present

## 2017-08-29 DIAGNOSIS — D61818 Other pancytopenia: Secondary | ICD-10-CM | POA: Diagnosis not present

## 2017-08-29 DIAGNOSIS — Z9181 History of falling: Secondary | ICD-10-CM | POA: Diagnosis not present

## 2017-08-29 DIAGNOSIS — R2681 Unsteadiness on feet: Secondary | ICD-10-CM | POA: Diagnosis not present

## 2017-08-31 DIAGNOSIS — R2681 Unsteadiness on feet: Secondary | ICD-10-CM | POA: Diagnosis not present

## 2017-08-31 DIAGNOSIS — Z9181 History of falling: Secondary | ICD-10-CM | POA: Diagnosis not present

## 2017-08-31 DIAGNOSIS — R296 Repeated falls: Secondary | ICD-10-CM | POA: Diagnosis not present

## 2017-08-31 DIAGNOSIS — D61818 Other pancytopenia: Secondary | ICD-10-CM | POA: Diagnosis not present

## 2018-01-14 DIAGNOSIS — Z0000 Encounter for general adult medical examination without abnormal findings: Secondary | ICD-10-CM | POA: Diagnosis not present

## 2018-01-14 DIAGNOSIS — E291 Testicular hypofunction: Secondary | ICD-10-CM | POA: Diagnosis not present

## 2018-01-14 DIAGNOSIS — E7800 Pure hypercholesterolemia, unspecified: Secondary | ICD-10-CM | POA: Diagnosis not present

## 2018-01-14 DIAGNOSIS — D61818 Other pancytopenia: Secondary | ICD-10-CM | POA: Diagnosis not present

## 2018-01-22 DIAGNOSIS — R350 Frequency of micturition: Secondary | ICD-10-CM | POA: Diagnosis not present

## 2018-01-22 DIAGNOSIS — E291 Testicular hypofunction: Secondary | ICD-10-CM | POA: Diagnosis not present

## 2018-01-22 DIAGNOSIS — Z23 Encounter for immunization: Secondary | ICD-10-CM | POA: Diagnosis not present

## 2018-01-22 DIAGNOSIS — E7800 Pure hypercholesterolemia, unspecified: Secondary | ICD-10-CM | POA: Diagnosis not present

## 2018-01-22 DIAGNOSIS — N401 Enlarged prostate with lower urinary tract symptoms: Secondary | ICD-10-CM | POA: Diagnosis not present

## 2018-01-22 DIAGNOSIS — D61818 Other pancytopenia: Secondary | ICD-10-CM | POA: Diagnosis not present

## 2018-01-22 DIAGNOSIS — Z125 Encounter for screening for malignant neoplasm of prostate: Secondary | ICD-10-CM | POA: Diagnosis not present

## 2018-01-22 DIAGNOSIS — Z0000 Encounter for general adult medical examination without abnormal findings: Secondary | ICD-10-CM | POA: Diagnosis not present

## 2018-04-23 DIAGNOSIS — E7800 Pure hypercholesterolemia, unspecified: Secondary | ICD-10-CM | POA: Diagnosis not present

## 2018-04-23 DIAGNOSIS — N289 Disorder of kidney and ureter, unspecified: Secondary | ICD-10-CM | POA: Diagnosis not present

## 2018-04-23 DIAGNOSIS — N401 Enlarged prostate with lower urinary tract symptoms: Secondary | ICD-10-CM | POA: Diagnosis not present

## 2018-04-23 DIAGNOSIS — D61818 Other pancytopenia: Secondary | ICD-10-CM | POA: Diagnosis not present

## 2018-04-23 DIAGNOSIS — E042 Nontoxic multinodular goiter: Secondary | ICD-10-CM | POA: Diagnosis not present

## 2018-04-30 DIAGNOSIS — R229 Localized swelling, mass and lump, unspecified: Secondary | ICD-10-CM | POA: Diagnosis not present

## 2018-04-30 DIAGNOSIS — Z0000 Encounter for general adult medical examination without abnormal findings: Secondary | ICD-10-CM | POA: Diagnosis not present

## 2018-04-30 DIAGNOSIS — E042 Nontoxic multinodular goiter: Secondary | ICD-10-CM | POA: Diagnosis not present

## 2018-04-30 DIAGNOSIS — E7800 Pure hypercholesterolemia, unspecified: Secondary | ICD-10-CM | POA: Diagnosis not present

## 2018-04-30 DIAGNOSIS — D61818 Other pancytopenia: Secondary | ICD-10-CM | POA: Diagnosis not present

## 2018-04-30 DIAGNOSIS — R350 Frequency of micturition: Secondary | ICD-10-CM | POA: Diagnosis not present

## 2018-04-30 DIAGNOSIS — N401 Enlarged prostate with lower urinary tract symptoms: Secondary | ICD-10-CM | POA: Diagnosis not present

## 2018-04-30 DIAGNOSIS — N289 Disorder of kidney and ureter, unspecified: Secondary | ICD-10-CM | POA: Diagnosis not present

## 2018-05-15 DIAGNOSIS — L720 Epidermal cyst: Secondary | ICD-10-CM | POA: Diagnosis not present

## 2018-07-26 DIAGNOSIS — N289 Disorder of kidney and ureter, unspecified: Secondary | ICD-10-CM | POA: Diagnosis not present

## 2018-07-26 DIAGNOSIS — D61818 Other pancytopenia: Secondary | ICD-10-CM | POA: Diagnosis not present

## 2018-07-26 DIAGNOSIS — E7800 Pure hypercholesterolemia, unspecified: Secondary | ICD-10-CM | POA: Diagnosis not present

## 2018-07-26 DIAGNOSIS — E042 Nontoxic multinodular goiter: Secondary | ICD-10-CM | POA: Diagnosis not present

## 2018-07-26 DIAGNOSIS — E291 Testicular hypofunction: Secondary | ICD-10-CM | POA: Diagnosis not present

## 2018-08-07 DIAGNOSIS — E042 Nontoxic multinodular goiter: Secondary | ICD-10-CM | POA: Diagnosis not present

## 2018-08-07 DIAGNOSIS — E291 Testicular hypofunction: Secondary | ICD-10-CM | POA: Diagnosis not present

## 2018-08-07 DIAGNOSIS — R2681 Unsteadiness on feet: Secondary | ICD-10-CM | POA: Diagnosis not present

## 2018-08-07 DIAGNOSIS — N401 Enlarged prostate with lower urinary tract symptoms: Secondary | ICD-10-CM | POA: Diagnosis not present

## 2018-08-07 DIAGNOSIS — D61818 Other pancytopenia: Secondary | ICD-10-CM | POA: Diagnosis not present

## 2018-08-07 DIAGNOSIS — E7800 Pure hypercholesterolemia, unspecified: Secondary | ICD-10-CM | POA: Diagnosis not present

## 2018-10-02 DIAGNOSIS — G4762 Sleep related leg cramps: Secondary | ICD-10-CM | POA: Diagnosis not present

## 2018-11-01 ENCOUNTER — Other Ambulatory Visit: Payer: Self-pay

## 2018-12-05 DIAGNOSIS — E785 Hyperlipidemia, unspecified: Secondary | ICD-10-CM | POA: Diagnosis not present

## 2018-12-05 DIAGNOSIS — I10 Essential (primary) hypertension: Secondary | ICD-10-CM | POA: Diagnosis not present

## 2018-12-05 DIAGNOSIS — Z0000 Encounter for general adult medical examination without abnormal findings: Secondary | ICD-10-CM | POA: Diagnosis not present

## 2018-12-12 DIAGNOSIS — Z23 Encounter for immunization: Secondary | ICD-10-CM | POA: Diagnosis not present

## 2018-12-12 DIAGNOSIS — D61818 Other pancytopenia: Secondary | ICD-10-CM | POA: Diagnosis not present

## 2018-12-12 DIAGNOSIS — E785 Hyperlipidemia, unspecified: Secondary | ICD-10-CM | POA: Diagnosis not present

## 2018-12-12 DIAGNOSIS — E291 Testicular hypofunction: Secondary | ICD-10-CM | POA: Diagnosis not present

## 2018-12-12 DIAGNOSIS — D649 Anemia, unspecified: Secondary | ICD-10-CM | POA: Diagnosis not present

## 2018-12-12 DIAGNOSIS — N289 Disorder of kidney and ureter, unspecified: Secondary | ICD-10-CM | POA: Diagnosis not present

## 2019-03-18 DIAGNOSIS — Z20828 Contact with and (suspected) exposure to other viral communicable diseases: Secondary | ICD-10-CM | POA: Diagnosis not present

## 2019-04-08 DIAGNOSIS — D649 Anemia, unspecified: Secondary | ICD-10-CM | POA: Diagnosis not present

## 2019-04-08 DIAGNOSIS — D61818 Other pancytopenia: Secondary | ICD-10-CM | POA: Diagnosis not present

## 2019-04-08 DIAGNOSIS — E291 Testicular hypofunction: Secondary | ICD-10-CM | POA: Diagnosis not present

## 2019-04-08 DIAGNOSIS — E785 Hyperlipidemia, unspecified: Secondary | ICD-10-CM | POA: Diagnosis not present

## 2019-04-08 DIAGNOSIS — E538 Deficiency of other specified B group vitamins: Secondary | ICD-10-CM | POA: Diagnosis not present

## 2019-04-08 DIAGNOSIS — R634 Abnormal weight loss: Secondary | ICD-10-CM | POA: Diagnosis not present

## 2019-04-15 DIAGNOSIS — D61818 Other pancytopenia: Secondary | ICD-10-CM | POA: Diagnosis not present

## 2019-04-15 DIAGNOSIS — E7800 Pure hypercholesterolemia, unspecified: Secondary | ICD-10-CM | POA: Diagnosis not present

## 2019-04-15 DIAGNOSIS — N401 Enlarged prostate with lower urinary tract symptoms: Secondary | ICD-10-CM | POA: Diagnosis not present

## 2019-04-15 DIAGNOSIS — E538 Deficiency of other specified B group vitamins: Secondary | ICD-10-CM | POA: Diagnosis not present

## 2019-05-01 ENCOUNTER — Ambulatory Visit: Payer: Medicare Other | Attending: Internal Medicine

## 2019-05-01 DIAGNOSIS — Z20822 Contact with and (suspected) exposure to covid-19: Secondary | ICD-10-CM

## 2019-05-02 LAB — NOVEL CORONAVIRUS, NAA: SARS-CoV-2, NAA: NOT DETECTED

## 2019-06-06 IMAGING — CT CT ABD-PELV W/O CM
1 of 2 series · 15 of 32 positions shown, 19 images · non-contrast
Comparison: None

CLINICAL DATA: Loss of 40 pounds.

EXAM:
CT ABDOMEN AND PELVIS WITHOUT CONTRAST
TECHNIQUE: Multidetector CT imaging of the abdomen and pelvis was performed
following the standard protocol without IV contrast.

[Series 3: abd/pelvis w/(date) · axial · 0.75mm/px · z∈[-381,+19]mm · 15 of 88 slices shown, 19 images]
[im 4/88  soft-tissue]
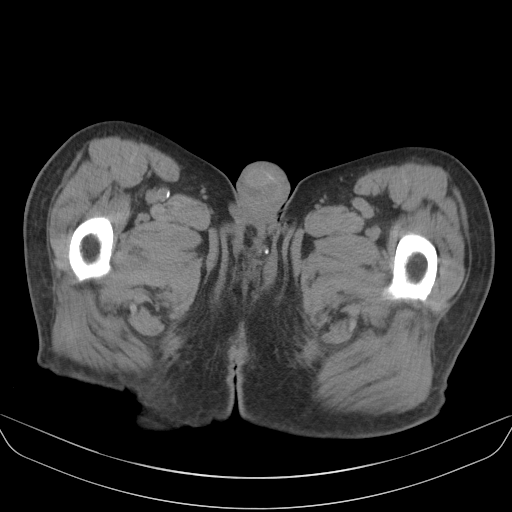
[im 4/88  bone]
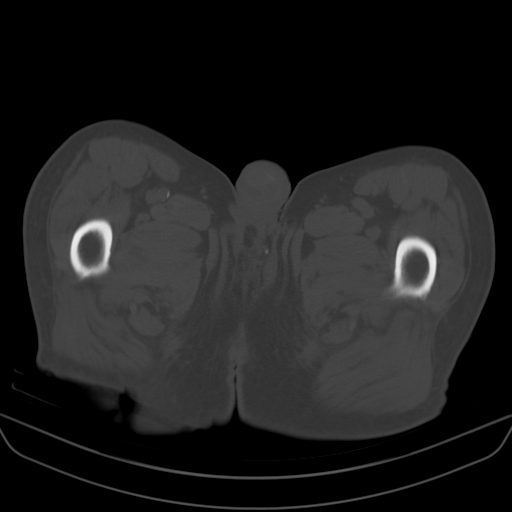
[im 11/88  soft-tissue]
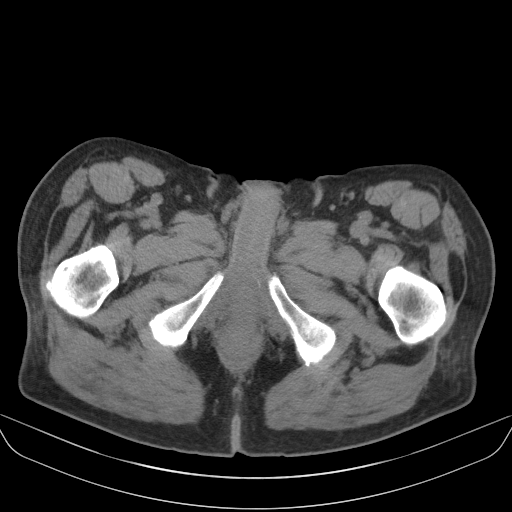
[im 19/88  soft-tissue]
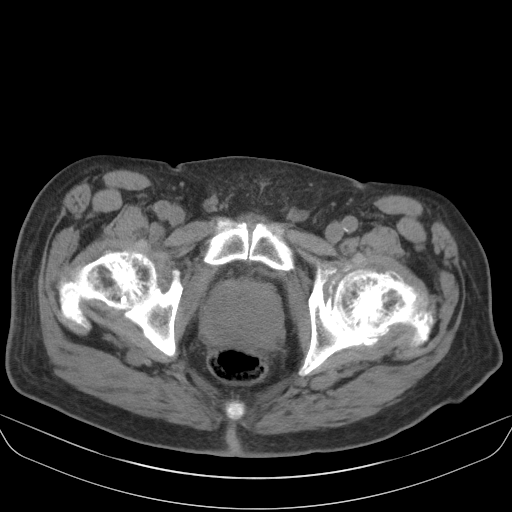
[im 26/88  soft-tissue]
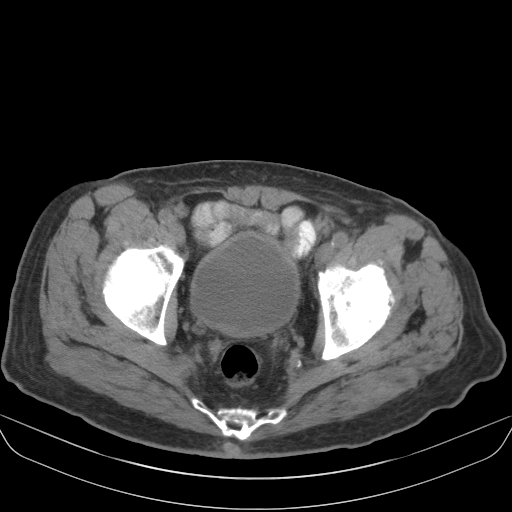
[im 30/88  soft-tissue]
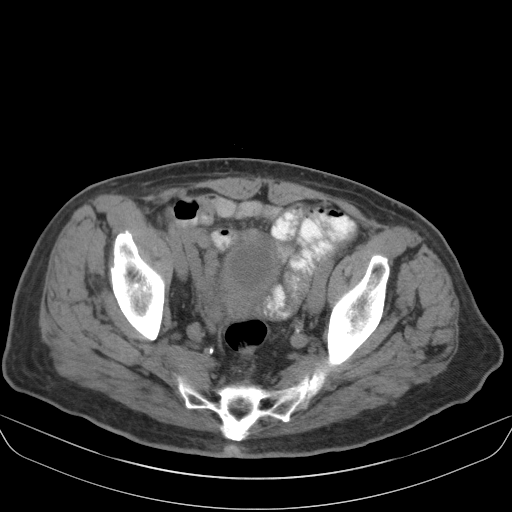
[im 37/88  soft-tissue]
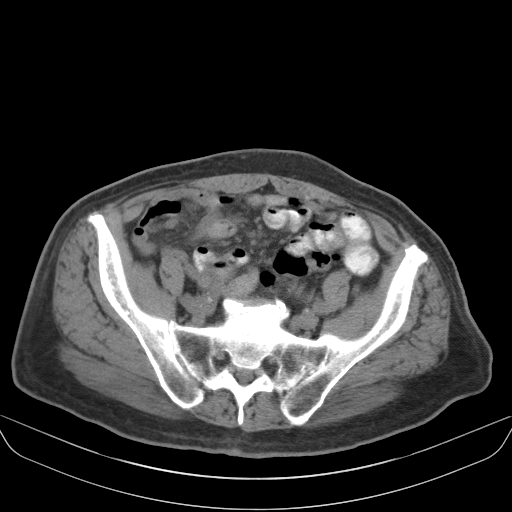
[im 44/88  soft-tissue]
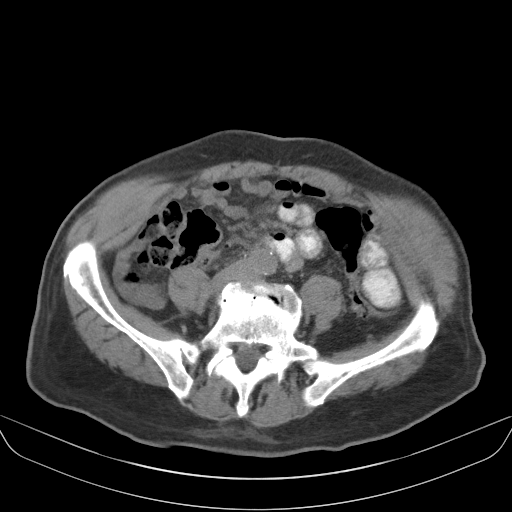
[im 51/88  soft-tissue]
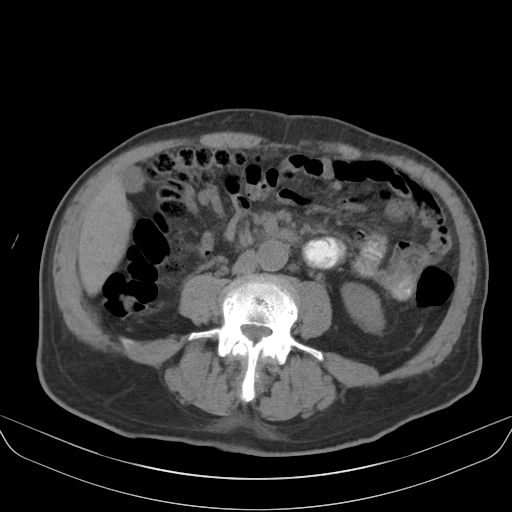
[im 59/88  soft-tissue]
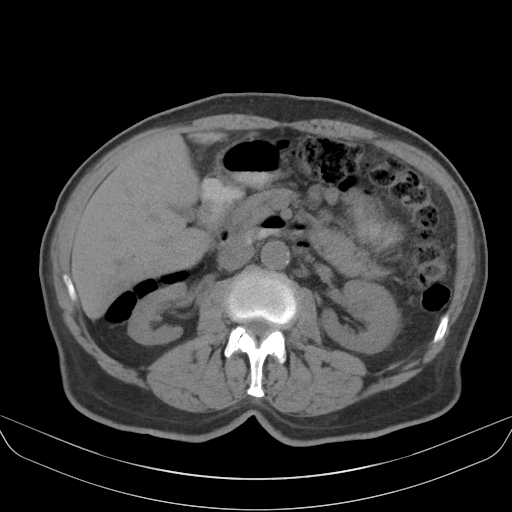
[im 59/88  bone]
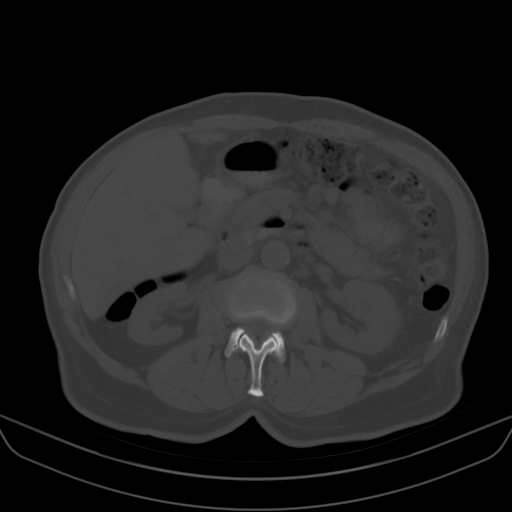
[im 62/88  soft-tissue]
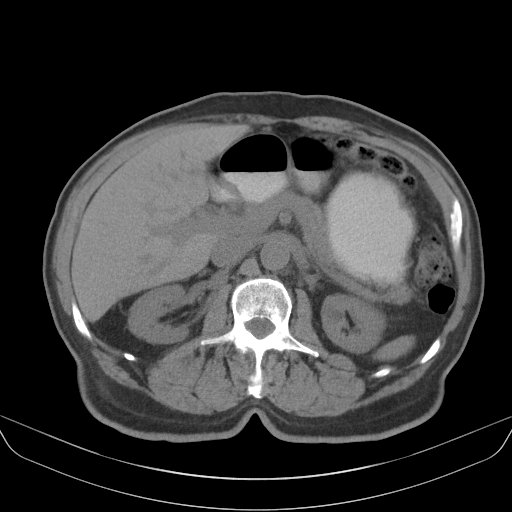
[im 69/88  soft-tissue]
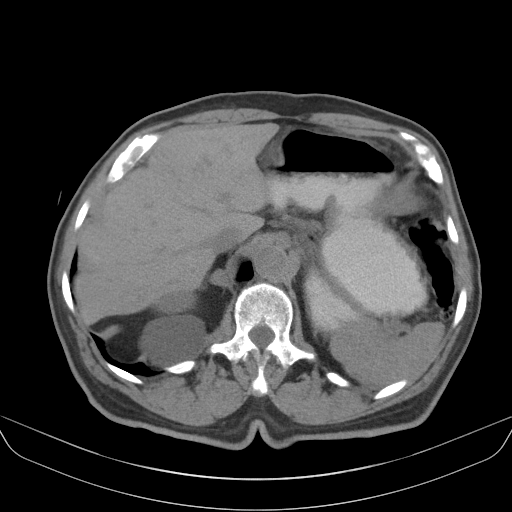
[im 73/88  lung]
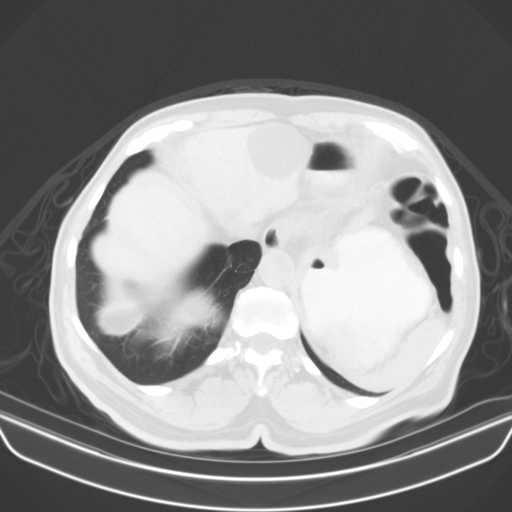
[im 77/88  soft-tissue]
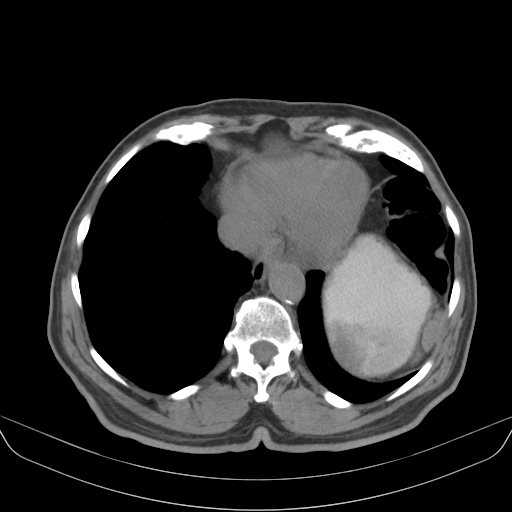
[im 77/88  lung]
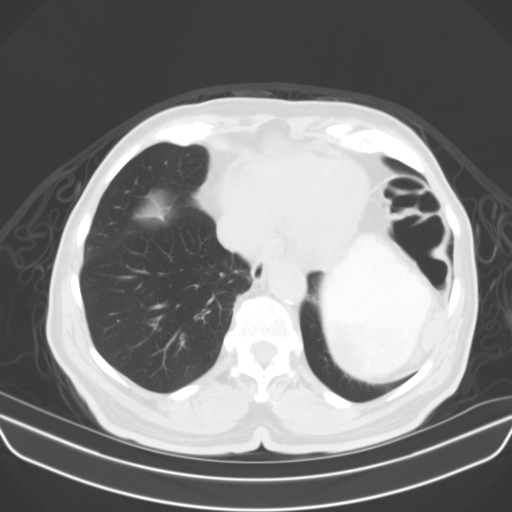
[im 80/88  lung]
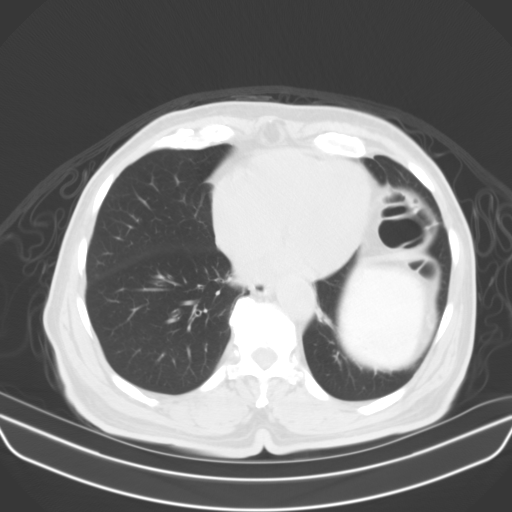
[im 84/88  soft-tissue]
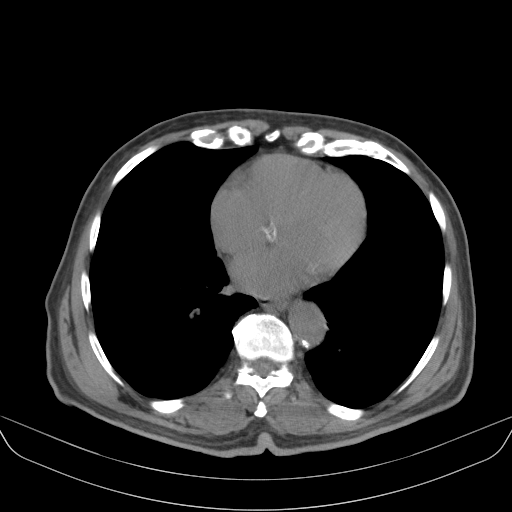
[im 84/88  lung]
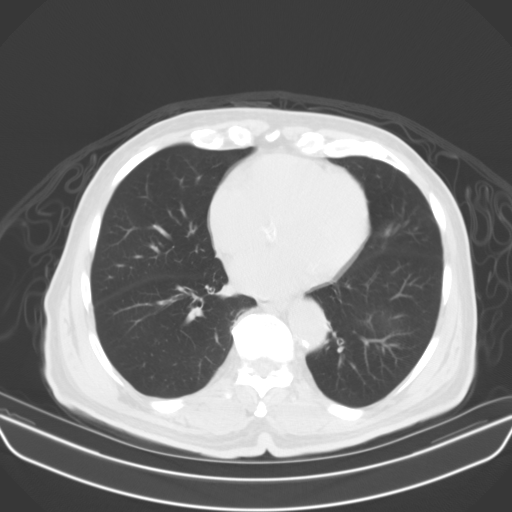

[15 of 32 positions shown; findings below may reference images not displayed]

FINDINGS: Lower chest: No acute abnormality.

Hepatobiliary: Cyst within the lateral segment of left lobe of liver
measures 4 cm. No suspicious liver abnormalities. The gallbladder
appears normal.

Pancreas: Unremarkable. No pancreatic ductal dilatation or
surrounding inflammatory changes.

Spleen: Normal in size without focal abnormality.

Adrenals/Urinary Tract: The adrenal glands are normal. Right kidney
cyst measures 5.8 cm and is incompletely characterized without IV
contrast. No kidney stone or hydronephrosis. Urinary bladder appears
within normal limits.

Stomach/Bowel: Eventration of the left hemidiaphragm identified
containing loops of colon and stomach. The small bowel loops have a
normal caliber. No abnormal wall thickening or inflammation. The
appendix is visualized and appears normal. Colonic diverticulosis
identified without acute inflammation.

Vascular/Lymphatic: Aortic atherosclerosis. No aneurysm. No
retroperitoneal adenopathy. No small bowel mesenteric adenopathy.
There is no pelvic or inguinal adenopathy identified.

Reproductive: Prostate gland measures 5.7 by 4.8 by 5.8 cm (volume =
83 cm^3).

Other: No abdominal wall hernia or abnormality. No abdominopelvic
ascites.

Musculoskeletal: Advanced degenerative changes are noted involving
both hip joints left greater than right. There is degenerative disc
disease noted within the lumbar spine. Most advanced at L4-5 and
L5-S1.
IMPRESSION: 1. No findings identified to explain patient's loss of weight. No
mass or adenopathy noted.
2. Prostate gland enlargement
3.  Aortic Atherosclerosis ([id number].I).
4. Lumbar degenerative disc disease
5. Liver cysts
6. Kidney cysts which are incompletely characterized without IV
contrast.

## 2019-06-26 ENCOUNTER — Ambulatory Visit (HOSPITAL_COMMUNITY)
Admission: EM | Admit: 2019-06-26 | Discharge: 2019-06-26 | Disposition: A | Discharge status: Home / Self Care | Payer: Medicare Other | Attending: Internal Medicine | Admitting: Internal Medicine

## 2019-06-26 ENCOUNTER — Other Ambulatory Visit: Payer: Self-pay

## 2019-06-26 DIAGNOSIS — R0981 Nasal congestion: Secondary | ICD-10-CM | POA: Insufficient documentation

## 2019-06-26 DIAGNOSIS — M436 Torticollis: Secondary | ICD-10-CM | POA: Insufficient documentation

## 2019-06-26 DIAGNOSIS — Z20822 Contact with and (suspected) exposure to covid-19: Secondary | ICD-10-CM | POA: Diagnosis not present

## 2019-06-26 MED ORDER — FLUTICASONE PROPIONATE 50 MCG/ACT NA SUSP: 1.0000 | Freq: Every day | NASAL | 0 refills | Status: DC

## 2019-06-26 NOTE — Discharge Instructions (Signed)
COVID swab pending May begin using Flonase nasal spray 1 to 2 spray in each nostril daily for congestion May supplement with the Claritin if needed 5 mg daily Continue Tylenol for neck pain  Follow-up if symptoms not improving or worsening

## 2019-06-26 NOTE — ED Triage Notes (Signed)
Pt c/o recurrent runny nose/congestion for several months. Denies cough, HA, sore throat, fever, n/v/d, or abdom pain.

## 2019-06-26 NOTE — ED Provider Notes (Signed)
[facility]    CSN: [id number] Arrival date & time: 06/26/19  1500      History   Chief Complaint Chief Complaint  Patient presents with  . Nasal Congestion    HPI John Rose is a 86 y.o. male history of testosterone deficiency, presenting today for evaluation of nasal congestion and neck stiffness.  Patient notes that he has persistent intermittent nasal congestion.  No recent worsening.  He has felt more neck stiffness of recently which prompted his concern to be screened for Covid today.  States that he likes to occasionally be screened to ensure his congestion is not Covid.  Neck stiffness is mainly in the morning, improves throughout the day.  Denies any associated pain just feels stiff.  Has been attributed to arthritis.  Improves with taking arthritis medicine.  He denies any sore throat or cough.  Denies any GI symptoms.  Normal oral intake.  Normal activity level.  Denies fevers.  Denies sick contacts.  HPI  Past Medical History:  Diagnosis Date  . Lumbar spondylosis 11/10/2016  . Testosterone deficiency     Patient Active Problem List   Diagnosis Date Noted  . Lumbar spondylosis 11/10/2016    Past Surgical History:  Procedure Laterality Date  . NO PAST SURGERIES         Home Medications    Prior to Admission medications   Medication Sig Start Date End Date Taking? Authorizing Provider  acetaminophen (TYLENOL) 650 MG CR tablet Take 650 mg by mouth every 8 (eight) hours as needed for pain.    [provider]  ANDROGEL PUMP 20.25 MG/ACT (1.62%) GEL Apply 1 Dose topically 4 (four) times daily.  10/25/16   [provider]  fluticasone (FLONASE) 50 MCG/ACT nasal spray Place 1-2 sprays into both nostrils daily for 7 days. 06/26/19 07/03/19  Maxfield Gildersleeve C, PA-C  furosemide (LASIX) 40 MG tablet Take 20 mg by mouth daily as needed. Take 1-2 tablets if needed    [provider]  gabapentin (NEURONTIN) 300 MG capsule Take 300 mg  by mouth 2 (two) times daily. 10/23/16   [provider]  meloxicam (MOBIC) 15 MG tablet take 1 tablet by mouth once daily if needed for pain 10/23/16   [provider]  methocarbamol (ROBAXIN) 500 MG tablet take 1 tablet by mouth twice a day if needed 10/27/16   [provider]  tamsulosin (FLOMAX) 0.4 MG CAPS capsule Take 0.4 mg by mouth daily.  08/16/16   [provider]    Family History No family history on file.  Social History Social History   Tobacco Use  . Smoking status: Never Smoker  . Smokeless tobacco: Never Used  Substance Use Topics  . Alcohol use: No  . Drug use: No     Allergies   Patient has no known allergies.   Review of Systems Review of Systems  Constitutional: Negative for activity change, appetite change, chills, fatigue and fever.  HENT: Positive for congestion and rhinorrhea. Negative for ear pain, sinus pressure, sore throat and trouble swallowing.   Eyes: Negative for discharge and redness.  Respiratory: Negative for cough, chest tightness and shortness of breath.   Cardiovascular: Negative for chest pain.  Gastrointestinal: Negative for abdominal pain, diarrhea, nausea and vomiting.  Musculoskeletal: Positive for myalgias and neck stiffness. Negative for neck pain.  Skin: Negative for rash.  Neurological: Negative for dizziness, light-headedness and headaches.     Physical Exam Triage Vital Signs ED Triage Vitals  Enc Vitals Group     BP 06/26/19 1536 (!) 137/56     Pulse Rate 06/26/19 1536 68     Resp 06/26/19 1536 20     Temp 06/26/19 1536 98.4 F (36.9 C)     Temp Source 06/26/19 1536 Oral     SpO2 06/26/19 1536 99 %     Weight --      Height --      Head Circumference --      Peak Flow --      Pain Score 06/26/19 1540 0     Pain Loc --      Pain Edu? --      Excl. in GC? --    No data found.  Updated Vital Signs BP (!) 137/56 (BP Location: Right Arm)   Pulse 68   Temp 98.4 F (36.9 C)  (Oral)   Resp 20   SpO2 99%   Visual Acuity Right Eye Distance:   Left Eye Distance:   Bilateral Distance:    Right Eye Near:   Left Eye Near:    Bilateral Near:     Physical Exam Vitals and nursing note reviewed.  Constitutional:      Appearance: He is well-developed.     Comments: No acute distress  HENT:     Head: Normocephalic and atraumatic.     Ears:     Comments: Bilateral ears without tenderness to palpation of external auricle, tragus and mastoid, EAC's without erythema or swelling, TM's with good bony landmarks and cone of light. Non erythematous.     Nose: Nose normal.     Comments: Nasal mucosa minimally erythematous, mildly swollen turbinates    Mouth/Throat:     Comments: Oral mucosa pink and moist, no tonsillar enlargement or exudate. Posterior pharynx patent and nonerythematous, no uvula deviation or swelling. Normal phonation. Eyes:     Conjunctiva/sclera: Conjunctivae normal.  Neck:     Comments: Full active range of motion of neck, nontender to palpation along cervical spine midline, nontender throughout bilateral cervical musculature Cardiovascular:     Rate and Rhythm: Normal rate.  Pulmonary:     Effort: Pulmonary effort is normal. No respiratory distress.     Comments: Breathing comfortably at rest, CTABL, no wheezing, rales or other adventitious sounds auscultated Abdominal:     General: There is no distension.  Musculoskeletal:        General: Normal range of motion.     Cervical back: Neck supple.  Skin:    General: Skin is warm and dry.  Neurological:     Mental Status: He is alert and oriented to person, place, and time.      UC Treatments / Results  Labs (all labs ordered are listed, but only abnormal results are displayed) Labs Reviewed  SARS CORONAVIRUS 2 (TAT 6-24 HRS)    EKG   Radiology No results found.  Procedures Procedures (including critical care time)  Medications Ordered in UC Medications - No data to  display  Initial Impression / Assessment and Plan / UC Course  I have reviewed the triage vital signs and the nursing notes.  Pertinent labs & imaging results that were available during my care of the patient were reviewed by me and considered in my medical decision making (see chart for details).     Covid PCR pending for screening.  Flonase as needed for nasal congestion.  No nuchal rigidity, suspect neck pain most likely arthritic versus muscular etiology.  Do not  suspect meningitis.  Continue Tylenol for this.Would be candidate for infusion if positive.  Please call Nicola Girt [phone] if results positive- son/nephew  Discussed strict return precautions. Patient verbalized understanding and is agreeable with plan.  Final Clinical Impressions(s) / UC Diagnoses   Final diagnoses:  Nasal congestion  Encounter for laboratory testing for COVID-19 virus  Neck stiffness     Discharge Instructions     COVID swab pending May begin using Flonase nasal spray 1 to 2 spray in each nostril daily for congestion May supplement with the Claritin if needed 5 mg daily Continue Tylenol for neck pain  Follow-up if symptoms not improving or worsening   ED Prescriptions    Medication Sig Dispense Auth. Provider   fluticasone (FLONASE) 50 MCG/ACT nasal spray Place 1-2 sprays into both nostrils daily for 7 days. 1 g Faustina Gebert, [place] C, PA-C     PDMP not reviewed this encounter.   Janith Lima, PA-C 06/26/19 1706

## 2019-06-27 LAB — SARS CORONAVIRUS 2 (TAT 6-24 HRS): SARS Coronavirus 2: NEGATIVE

## 2019-08-11 ENCOUNTER — Other Ambulatory Visit: Payer: Self-pay

## 2019-08-11 ENCOUNTER — Ambulatory Visit (HOSPITAL_COMMUNITY)
Admission: EM | Admit: 2019-08-11 | Discharge: 2019-08-11 | Disposition: A | Discharge status: Home / Self Care | Payer: Medicare Other | Attending: Family Medicine | Admitting: Family Medicine

## 2019-08-11 ENCOUNTER — Encounter (HOSPITAL_COMMUNITY): Payer: Self-pay

## 2019-08-11 DIAGNOSIS — Z79899 Other long term (current) drug therapy: Secondary | ICD-10-CM | POA: Insufficient documentation

## 2019-08-11 DIAGNOSIS — Z20822 Contact with and (suspected) exposure to covid-19: Secondary | ICD-10-CM | POA: Diagnosis not present

## 2019-08-11 DIAGNOSIS — M79671 Pain in right foot: Secondary | ICD-10-CM | POA: Diagnosis not present

## 2019-08-11 LAB — SARS CORONAVIRUS 2 (TAT 6-24 HRS): SARS Coronavirus 2: NEGATIVE

## 2019-08-11 NOTE — ED Provider Notes (Signed)
[facility]    CSN: [id number] Arrival date & time: 08/11/19  6295      History   Chief Complaint Chief Complaint  Patient presents with  . Foot Pain    HPI Herman Fiero is a 86 y.o. male.   Patient is a 86 year old male who presents today with right foot/toe pain.  Episode was this morning and woke him out of sleep.  This episode has since resolved.  Described as sharp, stabbing pain.  No swelling, erythema, injuries.  No history of gout.  No numbness, tingling.  ROS per HPI      Past Medical History:  Diagnosis Date  . Lumbar spondylosis 11/10/2016  . Testosterone deficiency     Patient Active Problem List   Diagnosis Date Noted  . Lumbar spondylosis 11/10/2016    Past Surgical History:  Procedure Laterality Date  . NO PAST SURGERIES         Home Medications    Prior to Admission medications   Medication Sig Start Date End Date Taking? Authorizing Provider  acetaminophen (TYLENOL) 650 MG CR tablet Take 650 mg by mouth every 8 (eight) hours as needed for pain.    [provider]  ANDROGEL PUMP 20.25 MG/ACT (1.62%) GEL Apply 1 Dose topically 4 (four) times daily.  10/25/16   [provider]  fluticasone (FLONASE) 50 MCG/ACT nasal spray Place 1-2 sprays into both nostrils daily for 7 days. 06/26/19 07/03/19  Wieters, Hallie C, PA-C  furosemide (LASIX) 40 MG tablet Take 20 mg by mouth daily as needed. Take 1-2 tablets if needed    [provider]  gabapentin (NEURONTIN) 300 MG capsule Take 300 mg by mouth 2 (two) times daily. 10/23/16   [provider]  meloxicam (MOBIC) 15 MG tablet take 1 tablet by mouth once daily if needed for pain 10/23/16   [provider]  methocarbamol (ROBAXIN) 500 MG tablet take 1 tablet by mouth twice a day if needed 10/27/16   [provider]  tamsulosin (FLOMAX) 0.4 MG CAPS capsule Take 0.4 mg by mouth daily.  08/16/16   [provider]    Family History History  reviewed. No pertinent family history.  Social History Social History   Tobacco Use  . Smoking status: Never Smoker  . Smokeless tobacco: Never Used  Substance Use Topics  . Alcohol use: No  . Drug use: No     Allergies   Patient has no known allergies.   Review of Systems Review of Systems   Physical Exam Triage Vital Signs ED Triage Vitals  Enc Vitals Group     BP 08/11/19 1123 (!) 151/84     Pulse Rate 08/11/19 1123 65     Resp 08/11/19 1123 16     Temp 08/11/19 1123 98.1 F (36.7 C)     Temp Source 08/11/19 1123 Oral     SpO2 08/11/19 1123 100 %     Weight --      Height --      Head Circumference --      Peak Flow --      Pain Score 08/11/19 1122 0     Pain Loc --      Pain Edu? --      Excl. in GC? --    No data found.  Updated Vital Signs BP (!) 151/84 (BP Location: Right Arm)   Pulse 65   Temp 98.1 F (36.7 C) (Oral)   Resp 16   SpO2  100%   Visual Acuity Right Eye Distance:   Left Eye Distance:   Bilateral Distance:    Right Eye Near:   Left Eye Near:    Bilateral Near:     Physical Exam Vitals and nursing note reviewed.  Constitutional:      Appearance: Normal appearance.  HENT:     Head: Normocephalic and atraumatic.     Nose: Nose normal.  Eyes:     Conjunctiva/sclera: Conjunctivae normal.  Pulmonary:     Effort: Pulmonary effort is normal.  Musculoskeletal:        General: Normal range of motion.     Cervical back: Normal range of motion.     Comments: Right foot normal appearing.  Nontender to palpation Nonerythematous, no bruising Normal temperature and color   Skin:    General: Skin is warm and dry.  Neurological:     Mental Status: He is alert.  Psychiatric:        Mood and Affect: Mood normal.      UC Treatments / Results  Labs (all labs ordered are listed, but only abnormal results are displayed) Labs Reviewed  SARS CORONAVIRUS 2 (TAT 6-24 HRS)    EKG   Radiology No results  found.  Procedures Procedures (including critical care time)  Medications Ordered in UC Medications - No data to display  Initial Impression / Assessment and Plan / UC Course  I have reviewed the triage vital signs and the nursing notes.  Pertinent labs & imaging results that were available during my care of the patient were reviewed by me and considered in my medical decision making (see chart for details).     Foot pain Self limiting Well-appearing on exam Follow up as needed for continued or worsening symptoms  Final Clinical Impressions(s) / UC Diagnoses   Final diagnoses:  Foot pain, right     Discharge Instructions     I do not believe this is anything to worry about.  Monitor for worsening symptoms. Covid swab done as requested    ED Prescriptions    None     PDMP not reviewed this encounter.   Orvan July, NP 08/12/19 [phone]

## 2019-08-11 NOTE — Discharge Instructions (Signed)
I do not believe this is anything to worry about.  Monitor for worsening symptoms. Covid swab done as requested

## 2019-08-11 NOTE — ED Triage Notes (Signed)
Pt c/o acute onset right foot/toe pain this morning and states, " I was wondering if it some kind of poison in me, like something I ate or if I didn't wash my hands.". Pt very HOH bilaterally. Foot w/o edema, non-tender on palpation, pain resolved per pt. Pt also requests a COVID test, "while I'm here". Denies HA, sore throat, has had runny nose, body aches, n/v/d, abdom pain, fever.

## 2019-08-13 DIAGNOSIS — E7800 Pure hypercholesterolemia, unspecified: Secondary | ICD-10-CM | POA: Diagnosis not present

## 2019-08-13 DIAGNOSIS — D61818 Other pancytopenia: Secondary | ICD-10-CM | POA: Diagnosis not present

## 2019-08-13 DIAGNOSIS — E538 Deficiency of other specified B group vitamins: Secondary | ICD-10-CM | POA: Diagnosis not present

## 2019-08-20 DIAGNOSIS — N289 Disorder of kidney and ureter, unspecified: Secondary | ICD-10-CM | POA: Diagnosis not present

## 2019-08-20 DIAGNOSIS — D61818 Other pancytopenia: Secondary | ICD-10-CM | POA: Diagnosis not present

## 2019-08-20 DIAGNOSIS — E785 Hyperlipidemia, unspecified: Secondary | ICD-10-CM | POA: Diagnosis not present

## 2019-08-20 DIAGNOSIS — G4762 Sleep related leg cramps: Secondary | ICD-10-CM | POA: Diagnosis not present

## 2019-08-20 DIAGNOSIS — R634 Abnormal weight loss: Secondary | ICD-10-CM | POA: Diagnosis not present

## 2019-08-20 DIAGNOSIS — E042 Nontoxic multinodular goiter: Secondary | ICD-10-CM | POA: Diagnosis not present

## 2019-08-20 DIAGNOSIS — R2681 Unsteadiness on feet: Secondary | ICD-10-CM | POA: Diagnosis not present

## 2019-08-20 DIAGNOSIS — I10 Essential (primary) hypertension: Secondary | ICD-10-CM | POA: Diagnosis not present

## 2019-08-20 DIAGNOSIS — E291 Testicular hypofunction: Secondary | ICD-10-CM | POA: Diagnosis not present

## 2019-08-20 DIAGNOSIS — E7800 Pure hypercholesterolemia, unspecified: Secondary | ICD-10-CM | POA: Diagnosis not present

## 2020-01-19 ENCOUNTER — Ambulatory Visit (HOSPITAL_COMMUNITY)
Admission: EM | Admit: 2020-01-19 | Discharge: 2020-01-19 | Disposition: A | Discharge status: Home / Self Care | Payer: Medicare Other | Attending: Urgent Care | Admitting: Urgent Care

## 2020-01-19 ENCOUNTER — Encounter (HOSPITAL_COMMUNITY): Payer: Self-pay

## 2020-01-19 ENCOUNTER — Other Ambulatory Visit: Payer: Self-pay

## 2020-01-19 ENCOUNTER — Ambulatory Visit (INDEPENDENT_AMBULATORY_CARE_PROVIDER_SITE_OTHER): Payer: Medicare Other

## 2020-01-19 DIAGNOSIS — R61 Generalized hyperhidrosis: Secondary | ICD-10-CM

## 2020-01-19 DIAGNOSIS — R6883 Chills (without fever): Secondary | ICD-10-CM

## 2020-01-19 DIAGNOSIS — B349 Viral infection, unspecified: Secondary | ICD-10-CM | POA: Diagnosis not present

## 2020-01-19 DIAGNOSIS — R519 Headache, unspecified: Secondary | ICD-10-CM | POA: Diagnosis not present

## 2020-01-19 LAB — POCT URINALYSIS DIPSTICK, ED / UC
Bilirubin Urine: NEGATIVE
Glucose, UA: NEGATIVE mg/dL
Ketones, ur: NEGATIVE mg/dL
Leukocytes,Ua: NEGATIVE
Nitrite: NEGATIVE
Protein, ur: NEGATIVE mg/dL
Specific Gravity, Urine: 1.025 (ref 1.005–1.030)
Urobilinogen, UA: 0.2 mg/dL (ref 0.0–1.0)
pH: 5.5 (ref 5.0–8.0)

## 2020-01-19 NOTE — Discharge Instructions (Signed)
We will notify you of your COVID-19 test results as they arrive and may take between 24 to 48 hours.  I encourage you to sign up for MyChart if you have not already done so as this can be the easiest way for us to communicate results to you online or through a phone app.  In the meantime, if you develop worsening symptoms including fever, chest pain, shortness of breath despite our current treatment plan then please report to the emergency room as this may be a sign of worsening status from possible COVID-19 infection.  Otherwise, we will manage this as a viral syndrome. For sore throat or cough try using a honey-based tea. Use 3 teaspoons of honey with juice squeezed from half lemon. Place shaved pieces of ginger into 1/2-1 cup of water and warm over stove top. Then mix the ingredients and repeat every 4 hours as needed. Please take Tylenol 500mg-650mg every 6 hours for aches and pains, fevers. Make sure you push fluids drinking mostly water but mix it with Gatorade.  Try to eat light meals including soups, broths and soft foods, fruits. 

## 2020-01-19 NOTE — ED Triage Notes (Signed)
Pt presents with complaints of breaking out in an unsual sweat upon mild exertion yesterday and having a mild headache; Pt states he feels much better but unsure why that happened.

## 2020-01-19 NOTE — ED Provider Notes (Signed)
[facility]   MRN: [id number] DOB: 1922/12/26  Subjective:   Yasir Kitner is a 86 y.o. male presenting for 1 day history of acute onset chills, episode of sweating from walking yesterday.  He had a mild headache as well.  Denies history of heart conditions.  Had both COVID vaccination doses, last dose was ~2 weeks ago.  No active confusion, headache, chest pain, difficulty breathing, belly pain, painful urination.  No current facility-administered medications for this encounter.  Current Outpatient Medications:  .  acetaminophen (TYLENOL) 650 MG CR tablet, Take 650 mg by mouth every 8 (eight) hours as needed for pain., Disp: , Rfl:  .  ANDROGEL PUMP 20.25 MG/ACT (1.62%) GEL, Apply 1 Dose topically 4 (four) times daily. , Disp: , Rfl: 0 .  fluticasone (FLONASE) 50 MCG/ACT nasal spray, Place 1-2 sprays into both nostrils daily for 7 days., Disp: 1 g, Rfl: 0 .  furosemide (LASIX) 40 MG tablet, Take 20 mg by mouth daily as needed. Take 1-2 tablets if needed, Disp: , Rfl:  .  gabapentin (NEURONTIN) 300 MG capsule, Take 300 mg by mouth 2 (two) times daily., Disp: , Rfl: 0 .  meloxicam (MOBIC) 15 MG tablet, take 1 tablet by mouth once daily if needed for pain, Disp: , Rfl: 0 .  methocarbamol (ROBAXIN) 500 MG tablet, take 1 tablet by mouth twice a day if needed, Disp: , Rfl: 0 .  tamsulosin (FLOMAX) 0.4 MG CAPS capsule, Take 0.4 mg by mouth daily. , Disp: , Rfl: 0   No Known Allergies  Past Medical History:  Diagnosis Date  . Lumbar spondylosis 11/10/2016  . Testosterone deficiency      Past Surgical History:  Procedure Laterality Date  . NO PAST SURGERIES      History reviewed. No pertinent family history.  Social History   Tobacco Use  . Smoking status: Never Smoker  . Smokeless tobacco: Never Used  Vaping Use  . Vaping Use: Never used  Substance Use Topics  . Alcohol use: No  . Drug use: No    ROS   Objective:   Vitals: BP 136/73 (BP Location:  Right Arm)   Pulse 77   Temp 97.9 F (36.6 C) (Oral)   Resp 16   SpO2 99%   Physical Exam Constitutional:      General: He is not in acute distress.    Appearance: Normal appearance. He is well-developed. He is not ill-appearing, toxic-appearing or diaphoretic.  HENT:     Head: Normocephalic and atraumatic.     Right Ear: External ear normal.     Left Ear: External ear normal.     Nose: Nose normal.     Mouth/Throat:     Mouth: Mucous membranes are moist.     Pharynx: Oropharynx is clear.  Eyes:     General: No scleral icterus.    Extraocular Movements: Extraocular movements intact.     Pupils: Pupils are equal, round, and reactive to light.  Cardiovascular:     Rate and Rhythm: Normal rate and regular rhythm.     Heart sounds: Normal heart sounds. No murmur heard.  No friction rub. No gallop.   Pulmonary:     Effort: Pulmonary effort is normal. No respiratory distress.     Breath sounds: Normal breath sounds. No stridor. No wheezing, rhonchi or rales.  Skin:    General: Skin is warm and dry.  Neurological:     Mental Status: He is alert and  oriented to person, place, and time.     Cranial Nerves: No cranial nerve deficit.     Motor: No weakness.     Coordination: Coordination normal.     Gait: Gait normal.  Psychiatric:        Mood and Affect: Mood normal.        Behavior: Behavior normal.        Thought Content: Thought content normal.     Results for orders placed or performed during the hospital encounter of 01/19/20 (from the past 24 hour(s))  POC Urinalysis dipstick     Status: Abnormal   Collection Time: 01/19/20  4:14 PM  Result Value Ref Range   Glucose, UA NEGATIVE NEGATIVE mg/dL   Bilirubin Urine NEGATIVE NEGATIVE   Ketones, ur NEGATIVE NEGATIVE mg/dL   Specific Gravity, Urine 1.025 1.005 - 1.030   Hgb urine dipstick TRACE (A) NEGATIVE   pH 5.5 5.0 - 8.0   Protein, ur NEGATIVE NEGATIVE mg/dL   Urobilinogen, UA 0.2 0.0 - 1.0 mg/dL   Nitrite NEGATIVE  NEGATIVE   Leukocytes,Ua NEGATIVE NEGATIVE   DG Chest 2 View  Result Date: 01/19/2020 CLINICAL DATA:  Chills EXAM: CHEST - 2 VIEW COMPARISON:  None. FINDINGS: Cardiac shadow is within normal limits. The lungs are well aerated bilaterally. Eventration of the left hemidiaphragm is again noted. No bony abnormality is seen. IMPRESSION: No acute abnormality noted. Electronically Signed   By: Alcide Clever M.D.   On: 01/19/2020 16:51    ED ECG REPORT   Date: 01/19/2020  Rate: 72bpm  Rhythm: normal sinus rhythm  QRS Axis: normal  Intervals: normal  ST/T Wave abnormalities: normal  Conduction Disutrbances:none  Narrative Interpretation: Sinus rhythm at 72 bpm, no previous EKG for comparison.  Old EKG Reviewed: none available  I have personally reviewed the EKG tracing and agree with the computerized printout as noted.  Assessment and Plan :   PDMP not reviewed this encounter.  1. Viral syndrome   2. Chills   3. Diaphoresis   4. Mild headache     Recommended supportive care for general viral syndrome.  Patient declined COVID-19 test.  Physical exam findings, vital signs and point-of-care tests are reassuring.  Discussed results with patient and his son. Counseled patient on potential for adverse effects with medications prescribed/recommended today, ER and return-to-clinic precautions discussed, patient verbalized understanding.    Wallis Bamberg, PA-C 01/19/20 1712

## 2020-02-12 DIAGNOSIS — E538 Deficiency of other specified B group vitamins: Secondary | ICD-10-CM | POA: Diagnosis not present

## 2020-02-12 DIAGNOSIS — E7800 Pure hypercholesterolemia, unspecified: Secondary | ICD-10-CM | POA: Diagnosis not present

## 2020-02-12 DIAGNOSIS — E291 Testicular hypofunction: Secondary | ICD-10-CM | POA: Diagnosis not present

## 2020-02-12 DIAGNOSIS — E042 Nontoxic multinodular goiter: Secondary | ICD-10-CM | POA: Diagnosis not present

## 2020-02-12 DIAGNOSIS — I10 Essential (primary) hypertension: Secondary | ICD-10-CM | POA: Diagnosis not present

## 2020-02-19 DIAGNOSIS — N289 Disorder of kidney and ureter, unspecified: Secondary | ICD-10-CM | POA: Diagnosis not present

## 2020-02-19 DIAGNOSIS — N401 Enlarged prostate with lower urinary tract symptoms: Secondary | ICD-10-CM | POA: Diagnosis not present

## 2020-02-19 DIAGNOSIS — Z0000 Encounter for general adult medical examination without abnormal findings: Secondary | ICD-10-CM | POA: Diagnosis not present

## 2020-02-19 DIAGNOSIS — I10 Essential (primary) hypertension: Secondary | ICD-10-CM | POA: Diagnosis not present

## 2020-02-19 DIAGNOSIS — R634 Abnormal weight loss: Secondary | ICD-10-CM | POA: Diagnosis not present

## 2020-02-19 DIAGNOSIS — G4762 Sleep related leg cramps: Secondary | ICD-10-CM | POA: Diagnosis not present

## 2020-02-19 DIAGNOSIS — E291 Testicular hypofunction: Secondary | ICD-10-CM | POA: Diagnosis not present

## 2020-02-19 DIAGNOSIS — D649 Anemia, unspecified: Secondary | ICD-10-CM | POA: Diagnosis not present

## 2020-02-19 DIAGNOSIS — D61818 Other pancytopenia: Secondary | ICD-10-CM | POA: Diagnosis not present

## 2020-02-19 DIAGNOSIS — E538 Deficiency of other specified B group vitamins: Secondary | ICD-10-CM | POA: Diagnosis not present

## 2020-02-19 DIAGNOSIS — E785 Hyperlipidemia, unspecified: Secondary | ICD-10-CM | POA: Diagnosis not present

## 2020-02-19 DIAGNOSIS — E042 Nontoxic multinodular goiter: Secondary | ICD-10-CM | POA: Diagnosis not present

## 2020-06-28 DIAGNOSIS — R61 Generalized hyperhidrosis: Secondary | ICD-10-CM | POA: Diagnosis not present

## 2020-06-28 DIAGNOSIS — G629 Polyneuropathy, unspecified: Secondary | ICD-10-CM | POA: Diagnosis not present

## 2020-06-28 DIAGNOSIS — E7800 Pure hypercholesterolemia, unspecified: Secondary | ICD-10-CM | POA: Diagnosis not present

## 2020-06-28 DIAGNOSIS — D649 Anemia, unspecified: Secondary | ICD-10-CM | POA: Diagnosis not present

## 2020-06-28 DIAGNOSIS — W19XXXD Unspecified fall, subsequent encounter: Secondary | ICD-10-CM | POA: Diagnosis not present

## 2020-06-28 DIAGNOSIS — N401 Enlarged prostate with lower urinary tract symptoms: Secondary | ICD-10-CM | POA: Diagnosis not present

## 2020-08-12 DIAGNOSIS — E7800 Pure hypercholesterolemia, unspecified: Secondary | ICD-10-CM | POA: Diagnosis not present

## 2020-08-12 DIAGNOSIS — D649 Anemia, unspecified: Secondary | ICD-10-CM | POA: Diagnosis not present

## 2020-08-12 DIAGNOSIS — R634 Abnormal weight loss: Secondary | ICD-10-CM | POA: Diagnosis not present

## 2020-08-12 DIAGNOSIS — N289 Disorder of kidney and ureter, unspecified: Secondary | ICD-10-CM | POA: Diagnosis not present

## 2020-08-19 DIAGNOSIS — E7800 Pure hypercholesterolemia, unspecified: Secondary | ICD-10-CM | POA: Diagnosis not present

## 2020-08-19 DIAGNOSIS — G629 Polyneuropathy, unspecified: Secondary | ICD-10-CM | POA: Diagnosis not present

## 2020-08-19 DIAGNOSIS — I10 Essential (primary) hypertension: Secondary | ICD-10-CM | POA: Diagnosis not present

## 2020-08-19 DIAGNOSIS — R2681 Unsteadiness on feet: Secondary | ICD-10-CM | POA: Diagnosis not present

## 2020-08-19 DIAGNOSIS — E291 Testicular hypofunction: Secondary | ICD-10-CM | POA: Diagnosis not present

## 2020-08-19 DIAGNOSIS — D61818 Other pancytopenia: Secondary | ICD-10-CM | POA: Diagnosis not present

## 2020-08-19 DIAGNOSIS — N401 Enlarged prostate with lower urinary tract symptoms: Secondary | ICD-10-CM | POA: Diagnosis not present

## 2020-12-16 DIAGNOSIS — I10 Essential (primary) hypertension: Secondary | ICD-10-CM | POA: Diagnosis not present

## 2020-12-16 DIAGNOSIS — Z125 Encounter for screening for malignant neoplasm of prostate: Secondary | ICD-10-CM | POA: Diagnosis not present

## 2020-12-16 DIAGNOSIS — Z0000 Encounter for general adult medical examination without abnormal findings: Secondary | ICD-10-CM | POA: Diagnosis not present

## 2020-12-16 DIAGNOSIS — E291 Testicular hypofunction: Secondary | ICD-10-CM | POA: Diagnosis not present

## 2020-12-16 DIAGNOSIS — E7800 Pure hypercholesterolemia, unspecified: Secondary | ICD-10-CM | POA: Diagnosis not present

## 2020-12-16 DIAGNOSIS — R634 Abnormal weight loss: Secondary | ICD-10-CM | POA: Diagnosis not present

## 2020-12-21 DIAGNOSIS — G629 Polyneuropathy, unspecified: Secondary | ICD-10-CM | POA: Diagnosis not present

## 2020-12-21 DIAGNOSIS — E7800 Pure hypercholesterolemia, unspecified: Secondary | ICD-10-CM | POA: Diagnosis not present

## 2020-12-21 DIAGNOSIS — N401 Enlarged prostate with lower urinary tract symptoms: Secondary | ICD-10-CM | POA: Diagnosis not present

## 2020-12-21 DIAGNOSIS — E538 Deficiency of other specified B group vitamins: Secondary | ICD-10-CM | POA: Diagnosis not present

## 2020-12-21 DIAGNOSIS — Z23 Encounter for immunization: Secondary | ICD-10-CM | POA: Diagnosis not present

## 2020-12-21 DIAGNOSIS — D649 Anemia, unspecified: Secondary | ICD-10-CM | POA: Diagnosis not present

## 2020-12-21 DIAGNOSIS — D61818 Other pancytopenia: Secondary | ICD-10-CM | POA: Diagnosis not present

## 2020-12-21 DIAGNOSIS — M25552 Pain in left hip: Secondary | ICD-10-CM | POA: Diagnosis not present

## 2020-12-21 DIAGNOSIS — I10 Essential (primary) hypertension: Secondary | ICD-10-CM | POA: Diagnosis not present

## 2020-12-21 DIAGNOSIS — R2681 Unsteadiness on feet: Secondary | ICD-10-CM | POA: Diagnosis not present

## 2020-12-21 DIAGNOSIS — E291 Testicular hypofunction: Secondary | ICD-10-CM | POA: Diagnosis not present

## 2020-12-24 DIAGNOSIS — M1612 Unilateral primary osteoarthritis, left hip: Secondary | ICD-10-CM | POA: Diagnosis not present

## 2020-12-24 DIAGNOSIS — M25552 Pain in left hip: Secondary | ICD-10-CM | POA: Diagnosis not present

## 2020-12-27 ENCOUNTER — Telehealth: Payer: Self-pay | Admitting: Hematology

## 2020-12-27 NOTE — Telephone Encounter (Signed)
Scheduled appt per 9/23 referral. Pt is aware of appt date and time.  

## 2020-12-28 ENCOUNTER — Inpatient Hospital Stay: Payer: Medicare Other

## 2020-12-28 ENCOUNTER — Inpatient Hospital Stay: Payer: Medicare Other | Attending: Hematology | Admitting: Hematology

## 2020-12-28 VITALS — BP 149/87 | HR 73 | Temp 97.9°F | Resp 17 | Wt 151.0 lb

## 2020-12-28 DIAGNOSIS — N183 Chronic kidney disease, stage 3 unspecified: Secondary | ICD-10-CM | POA: Insufficient documentation

## 2020-12-28 DIAGNOSIS — D696 Thrombocytopenia, unspecified: Secondary | ICD-10-CM | POA: Insufficient documentation

## 2020-12-28 DIAGNOSIS — E291 Testicular hypofunction: Secondary | ICD-10-CM | POA: Insufficient documentation

## 2020-12-28 DIAGNOSIS — D709 Neutropenia, unspecified: Secondary | ICD-10-CM

## 2020-12-28 DIAGNOSIS — E538 Deficiency of other specified B group vitamins: Secondary | ICD-10-CM | POA: Diagnosis not present

## 2020-12-28 LAB — VITAMIN B12: Vitamin B-12: 172 pg/mL — ABNORMAL LOW (ref 180–914)

## 2020-12-28 LAB — CBC WITH DIFFERENTIAL/PLATELET
Abs Immature Granulocytes: 0 10*3/uL (ref 0.00–0.07)
Basophils Absolute: 0 10*3/uL (ref 0.0–0.1)
Basophils Relative: 1 %
Eosinophils Absolute: 0 10*3/uL (ref 0.0–0.5)
Eosinophils Relative: 2 %
HCT: 36.6 % — ABNORMAL LOW (ref 39.0–52.0)
Hemoglobin: 12 g/dL — ABNORMAL LOW (ref 13.0–17.0)
Immature Granulocytes: 0 %
Lymphocytes Relative: 42 %
Lymphs Abs: 1.1 10*3/uL (ref 0.7–4.0)
MCH: 32 pg (ref 26.0–34.0)
MCHC: 32.8 g/dL (ref 30.0–36.0)
MCV: 97.6 fL (ref 80.0–100.0)
Monocytes Absolute: 0.3 10*3/uL (ref 0.1–1.0)
Monocytes Relative: 12 %
Neutro Abs: 1.2 10*3/uL — ABNORMAL LOW (ref 1.7–7.7)
Neutrophils Relative %: 43 %
Platelets: 123 10*3/uL — ABNORMAL LOW (ref 150–400)
RBC: 3.75 MIL/uL — ABNORMAL LOW (ref 4.22–5.81)
RDW: 12.4 % (ref 11.5–15.5)
WBC: 2.6 10*3/uL — ABNORMAL LOW (ref 4.0–10.5)
nRBC: 0 % (ref 0.0–0.2)

## 2020-12-28 LAB — CMP (CANCER CENTER ONLY)
ALT: 8 U/L (ref 0–44)
AST: 16 U/L (ref 15–41)
Albumin: 3.9 g/dL (ref 3.5–5.0)
Alkaline Phosphatase: 70 U/L (ref 38–126)
Anion gap: 8 (ref 5–15)
BUN: 18 mg/dL (ref 8–23)
CO2: 26 mmol/L (ref 22–32)
Calcium: 9.1 mg/dL (ref 8.9–10.3)
Chloride: 108 mmol/L (ref 98–111)
Creatinine: 1.36 mg/dL — ABNORMAL HIGH (ref 0.61–1.24)
GFR, Estimated: 48 mL/min — ABNORMAL LOW (ref >60)
Glucose, Bld: 96 mg/dL (ref 70–99)
Potassium: 4.5 mmol/L (ref 3.5–5.1)
Sodium: 142 mmol/L (ref 135–145)
Total Bilirubin: 0.7 mg/dL (ref 0.3–1.2)
Total Protein: 6.5 g/dL (ref 6.5–8.1)

## 2020-12-28 LAB — LACTATE DEHYDROGENASE: LDH: 209 U/L — ABNORMAL HIGH (ref 98–192)

## 2020-12-28 LAB — IMMATURE PLATELET FRACTION: Immature Platelet Fraction: 2.7 % (ref 1.2–8.6)

## 2020-12-28 NOTE — Progress Notes (Addendum)
[facility]   HEMATOLOGY/ONCOLOGY CONSULTATION NOTE  Date of Service: 12/28/2020  Patient Care Team: Pearson Grippe, MD as PCP - General (Internal Medicine) Darci Needle, MD as Consulting Physician (Endocrinology)  CHIEF COMPLAINTS/PURPOSE OF CONSULTATION:  Evaluation for pancytopenia  HISTORY OF PRESENTING ILLNESS:   John Rose is a wonderful 87 y.o. male who has been referred to [place] by Dr Pearson Grippe for evaluation and management of cytopenias.  Patient has history of testosterone deficiency on testosterone replacement, BPH, dyslipidemia, multinodular goiter, B12 deficiency who recently had labs with his primary care physician that showed mild leukopenia with a WBC count of 2.8k, mild thrombocytopenia with platelets of 128k and a hemoglobin of 12.2 with an MCV of 97.  Patient notes no acute new symptoms over the last 6 to 12 months.  No sudden weight loss.  No new medications..  He has been on Neurontin for his neuropathy. He continues to be on testosterone replacement for hypogonadism. Patient notes no recent issues with infections.  No new fatigue.  No issues with bleeding or abnormal bruising.  No new lumps or bumps.  No new abdominal pain or distention.  No new bone pains.    MEDICAL HISTORY:  Past Medical History:  Diagnosis Date   Lumbar spondylosis 11/10/2016   Testosterone deficiency   History of B12 deficiency BPH Dyslipidemia  SURGICAL HISTORY: Past Surgical History:  Procedure Laterality Date   NO PAST SURGERIES      SOCIAL HISTORY: Social History   Socioeconomic History   Marital status: Single    Spouse name: Not on file   Number of children: 1   Years of education: BS   Highest education level: Not on file  Occupational History   Occupation: Retired  Tobacco Use   Smoking status: Never   Smokeless tobacco: Never  Vaping Use   Vaping Use: Never used  Substance and Sexual Activity   Alcohol use: No   Drug use: No   Sexual activity: Not on file  Other  Topics Concern   Not on file  Social History Narrative   Lives    Caffeine use: Tea, soda (pepsi)   Social Determinants of Health   Financial Resource Strain: Not on file  Food Insecurity: Not on file  Transportation Needs: Not on file  Physical Activity: Not on file  Stress: Not on file  Social Connections: Not on file  Intimate Partner Violence: Not on file    FAMILY HISTORY: No family history on file.  ALLERGIES:  has No Known Allergies.  MEDICATIONS:  Current Outpatient Medications  Medication Sig Dispense Refill   acetaminophen (TYLENOL) 650 MG CR tablet Take 650 mg by mouth every 8 (eight) hours as needed for pain.     ANDROGEL PUMP 20.25 MG/ACT (1.62%) GEL Apply 1 Dose topically 4 (four) times daily.   0   fluticasone (FLONASE) 50 MCG/ACT nasal spray Place 1-2 sprays into both nostrils daily for 7 days. 1 g 0   furosemide (LASIX) 40 MG tablet Take 20 mg by mouth daily as needed. Take 1-2 tablets if needed     gabapentin (NEURONTIN) 300 MG capsule Take 300 mg by mouth 2 (two) times daily.  0   meloxicam (MOBIC) 15 MG tablet take 1 tablet by mouth once daily if needed for pain  0   methocarbamol (ROBAXIN) 500 MG tablet take 1 tablet by mouth twice a day if needed  0   tamsulosin (FLOMAX) 0.4 MG CAPS capsule Take 0.4 mg by mouth daily.  0   No current facility-administered medications for this visit.    REVIEW OF SYSTEMS:    .10 Point review of Systems was done is negative except as noted above.  PHYSICAL EXAMINATION: ECOG PERFORMANCE STATUS:   . Vitals:   12/28/20 1422  BP: (!) 149/87  Pulse: 73  Resp: 17  Temp: 97.9 F (36.6 C)  SpO2: 100%   Filed Weights   12/28/20 1422  Weight: 151 lb (68.5 kg)   .Body mass index is 22.96 kg/m.  GENERAL:alert, in no acute distress and comfortable SKIN: no acute rashes, no significant lesions EYES: conjunctiva are pink and non-injected, sclera anicteric OROPHARYNX: MMM, no exudates, no oropharyngeal erythema or  ulceration NECK: supple, no JVD LYMPH:  no palpable lymphadenopathy in the cervical, axillary or inguinal regions LUNGS: clear to auscultation b/l with normal respiratory effort HEART: regular rate & rhythm ABDOMEN:  normoactive bowel sounds , non tender, not distended. Extremity: no pedal edema PSYCH: alert & oriented x 3 with fluent speech NEURO: no focal motor/sensory deficits  LABORATORY DATA:  I have reviewed the data as listed  . CBC Latest Ref Rng & Units 12/28/2020 12/28/2020  WBC 4.0 - 10.5 K/uL 2.6(L) -  Hemoglobin 13.0 - 17.0 g/dL 12.0(L) -  Hematocrit 37.5 - 51.0 % 36.6(L) 37.0(L)  Platelets 150 - 400 K/uL 123(L) -   .CBC    Component Value Date/Time   WBC 2.6 (L) 12/28/2020 1451   RBC 3.75 (L) 12/28/2020 1451   HGB 12.0 (L) 12/28/2020 1451   HCT 37.0 (L) 12/28/2020 1451   HCT 36.6 (L) 12/28/2020 1451   PLT 123 (L) 12/28/2020 1451   MCV 97.6 12/28/2020 1451   MCH 32.0 12/28/2020 1451   MCHC 32.8 12/28/2020 1451   RDW 12.4 12/28/2020 1451   LYMPHSABS 1.1 12/28/2020 1451   MONOABS 0.3 12/28/2020 1451   EOSABS 0.0 12/28/2020 1451   BASOSABS 0.0 12/28/2020 1451    . CMP Latest Ref Rng & Units 12/28/2020  Glucose 70 - 99 mg/dL 96  BUN 8 - 23 mg/dL 18  Creatinine 2.72 - 5.36 mg/dL 6.44(I)  Sodium 347 - 425 mmol/L 142  Potassium 3.5 - 5.1 mmol/L 4.5  Chloride 98 - 111 mmol/L 108  CO2 22 - 32 mmol/L 26  Calcium 8.9 - 10.3 mg/dL 9.1  Total Protein 6.5 - 8.1 g/dL 6.5  Total Bilirubin 0.3 - 1.2 mg/dL 0.7  Alkaline Phos 38 - 126 U/L 70  AST 15 - 41 U/L 16  ALT 0 - 44 U/L 8    Component     Latest Ref Rng & Units 12/28/2020  Folate, Hemolysate     Not Estab. ng/mL 277.0  HCT     37.5 - 51.0 % 37.0 (L)  Folate, RBC     >498 ng/mL 749  Immature Platelet Fraction     1.2 - 8.6 % 2.7  Copper     69 - 132 ug/dL 956  Vitamin L87     564 - 914 pg/mL 172 (L)  LDH     98 - 192 U/L 209 (H)    RADIOGRAPHIC STUDIES: I have personally reviewed the radiological  images as listed and agreed with the findings in the report. No results found.  ASSESSMENT & PLAN:   87 year old male with  #1 Mild leukopenia WBC count of 2.6 k with mild neutropenia of 1.2k This could be due to his gabapentin medication and could also sometimes be related to acid reflux management Cannot rule  out an element of MDS considering elevated MCV's. B12 deficiency also noted needs to be optimally replaced.  #2 mild thrombocytopenia platelets of 123k.Marland Kitchen  No issues with bleeding. -Continue to monitor this every 3-6 months from now  #3 B12 deficiency B12 level 172  #4 chronic kidney disease stage III  #5 hypogonadism on testosterone replacement Plan -Patient has mild leukopenia and mild thrombocytopenia which could be related to B12 deficiency. --B12 replacement 2000 mcg sublingual daily -Would also recommend taking vitamin B complex empirically p.o. once daily over-the-counter. -Cannot rule out the presence of possible MDS at his age but patient also take a conservative approach and not consider any invasive testing at this time. -Some thrombocytopenia could be due to preferential erythropoiesis in the setting of testosterone use -Neurontin can also cause some mild neutropenia however if this medication is important for him would watch counts carefully with this. -Continue follow-up with primary care physician please reconsult [place] if there are any significant blood count changes and patient is desirous of additional work-up.  followup  Labs today RTC with Dr Candise Che as needed based on labs    All of the patients questions were answered with apparent satisfaction. The patient knows to call the clinic with any problems, questions or concerns.  I spent 45 mins  counseling the patient face to face. The total time spent in the appointment was 60 mins  and more than 50% was on counseling and direct patient cares.    Wyvonnia Lora MD MS AAHIVMS [facility] Hematology/Oncology  Physician [facility]  (Office):       [phone] (Work cell):  [phone] (Fax):           [phone]  12/28/2020 2:12 PM

## 2020-12-29 LAB — FOLATE RBC
Folate, Hemolysate: 277 ng/mL
Folate, RBC: 749 ng/mL (ref >498)
Hematocrit: 37 % — ABNORMAL LOW (ref 37.5–51.0)

## 2020-12-29 LAB — COPPER, SERUM: Copper: 100 ug/dL (ref 69–132)

## 2020-12-31 DIAGNOSIS — E042 Nontoxic multinodular goiter: Secondary | ICD-10-CM | POA: Diagnosis not present

## 2020-12-31 DIAGNOSIS — I10 Essential (primary) hypertension: Secondary | ICD-10-CM | POA: Diagnosis not present

## 2020-12-31 DIAGNOSIS — E7800 Pure hypercholesterolemia, unspecified: Secondary | ICD-10-CM | POA: Diagnosis not present

## 2021-01-09 DIAGNOSIS — D649 Anemia, unspecified: Secondary | ICD-10-CM | POA: Diagnosis not present

## 2021-01-09 DIAGNOSIS — Z9181 History of falling: Secondary | ICD-10-CM | POA: Diagnosis not present

## 2021-01-09 DIAGNOSIS — I10 Essential (primary) hypertension: Secondary | ICD-10-CM | POA: Diagnosis not present

## 2021-01-09 DIAGNOSIS — E538 Deficiency of other specified B group vitamins: Secondary | ICD-10-CM | POA: Diagnosis not present

## 2021-01-09 DIAGNOSIS — M2560 Stiffness of unspecified joint, not elsewhere classified: Secondary | ICD-10-CM | POA: Diagnosis not present

## 2021-01-09 DIAGNOSIS — E7800 Pure hypercholesterolemia, unspecified: Secondary | ICD-10-CM | POA: Diagnosis not present

## 2021-01-09 DIAGNOSIS — E291 Testicular hypofunction: Secondary | ICD-10-CM | POA: Diagnosis not present

## 2021-01-09 DIAGNOSIS — N39498 Other specified urinary incontinence: Secondary | ICD-10-CM | POA: Diagnosis not present

## 2021-01-09 DIAGNOSIS — G629 Polyneuropathy, unspecified: Secondary | ICD-10-CM | POA: Diagnosis not present

## 2021-01-09 DIAGNOSIS — D61818 Other pancytopenia: Secondary | ICD-10-CM | POA: Diagnosis not present

## 2021-01-09 DIAGNOSIS — Z86010 Personal history of colonic polyps: Secondary | ICD-10-CM | POA: Diagnosis not present

## 2021-01-09 DIAGNOSIS — N401 Enlarged prostate with lower urinary tract symptoms: Secondary | ICD-10-CM | POA: Diagnosis not present

## 2021-01-09 DIAGNOSIS — E042 Nontoxic multinodular goiter: Secondary | ICD-10-CM | POA: Diagnosis not present

## 2021-01-09 DIAGNOSIS — M25552 Pain in left hip: Secondary | ICD-10-CM | POA: Diagnosis not present

## 2021-01-13 DIAGNOSIS — D649 Anemia, unspecified: Secondary | ICD-10-CM | POA: Diagnosis not present

## 2021-01-13 DIAGNOSIS — E042 Nontoxic multinodular goiter: Secondary | ICD-10-CM | POA: Diagnosis not present

## 2021-01-13 DIAGNOSIS — I10 Essential (primary) hypertension: Secondary | ICD-10-CM | POA: Diagnosis not present

## 2021-01-13 DIAGNOSIS — N401 Enlarged prostate with lower urinary tract symptoms: Secondary | ICD-10-CM | POA: Diagnosis not present

## 2021-01-13 DIAGNOSIS — G629 Polyneuropathy, unspecified: Secondary | ICD-10-CM | POA: Diagnosis not present

## 2021-01-13 DIAGNOSIS — N39498 Other specified urinary incontinence: Secondary | ICD-10-CM | POA: Diagnosis not present

## 2021-01-14 DIAGNOSIS — N39498 Other specified urinary incontinence: Secondary | ICD-10-CM | POA: Diagnosis not present

## 2021-01-14 DIAGNOSIS — D649 Anemia, unspecified: Secondary | ICD-10-CM | POA: Diagnosis not present

## 2021-01-14 DIAGNOSIS — G629 Polyneuropathy, unspecified: Secondary | ICD-10-CM | POA: Diagnosis not present

## 2021-01-14 DIAGNOSIS — N401 Enlarged prostate with lower urinary tract symptoms: Secondary | ICD-10-CM | POA: Diagnosis not present

## 2021-01-14 DIAGNOSIS — E042 Nontoxic multinodular goiter: Secondary | ICD-10-CM | POA: Diagnosis not present

## 2021-01-14 DIAGNOSIS — I10 Essential (primary) hypertension: Secondary | ICD-10-CM | POA: Diagnosis not present

## 2021-01-17 DIAGNOSIS — E042 Nontoxic multinodular goiter: Secondary | ICD-10-CM | POA: Diagnosis not present

## 2021-01-17 DIAGNOSIS — I10 Essential (primary) hypertension: Secondary | ICD-10-CM | POA: Diagnosis not present

## 2021-01-17 DIAGNOSIS — N39498 Other specified urinary incontinence: Secondary | ICD-10-CM | POA: Diagnosis not present

## 2021-01-17 DIAGNOSIS — G629 Polyneuropathy, unspecified: Secondary | ICD-10-CM | POA: Diagnosis not present

## 2021-01-17 DIAGNOSIS — N401 Enlarged prostate with lower urinary tract symptoms: Secondary | ICD-10-CM | POA: Diagnosis not present

## 2021-01-17 DIAGNOSIS — D649 Anemia, unspecified: Secondary | ICD-10-CM | POA: Diagnosis not present

## 2021-01-18 DIAGNOSIS — E042 Nontoxic multinodular goiter: Secondary | ICD-10-CM | POA: Diagnosis not present

## 2021-01-18 DIAGNOSIS — G629 Polyneuropathy, unspecified: Secondary | ICD-10-CM | POA: Diagnosis not present

## 2021-01-18 DIAGNOSIS — D649 Anemia, unspecified: Secondary | ICD-10-CM | POA: Diagnosis not present

## 2021-01-18 DIAGNOSIS — N401 Enlarged prostate with lower urinary tract symptoms: Secondary | ICD-10-CM | POA: Diagnosis not present

## 2021-01-18 DIAGNOSIS — I10 Essential (primary) hypertension: Secondary | ICD-10-CM | POA: Diagnosis not present

## 2021-01-18 DIAGNOSIS — N39498 Other specified urinary incontinence: Secondary | ICD-10-CM | POA: Diagnosis not present

## 2021-01-19 DIAGNOSIS — N401 Enlarged prostate with lower urinary tract symptoms: Secondary | ICD-10-CM | POA: Diagnosis not present

## 2021-01-19 DIAGNOSIS — E042 Nontoxic multinodular goiter: Secondary | ICD-10-CM | POA: Diagnosis not present

## 2021-01-19 DIAGNOSIS — D649 Anemia, unspecified: Secondary | ICD-10-CM | POA: Diagnosis not present

## 2021-01-19 DIAGNOSIS — G629 Polyneuropathy, unspecified: Secondary | ICD-10-CM | POA: Diagnosis not present

## 2021-01-19 DIAGNOSIS — N39498 Other specified urinary incontinence: Secondary | ICD-10-CM | POA: Diagnosis not present

## 2021-01-19 DIAGNOSIS — I10 Essential (primary) hypertension: Secondary | ICD-10-CM | POA: Diagnosis not present

## 2021-01-25 DIAGNOSIS — D649 Anemia, unspecified: Secondary | ICD-10-CM | POA: Diagnosis not present

## 2021-01-25 DIAGNOSIS — E042 Nontoxic multinodular goiter: Secondary | ICD-10-CM | POA: Diagnosis not present

## 2021-01-25 DIAGNOSIS — G629 Polyneuropathy, unspecified: Secondary | ICD-10-CM | POA: Diagnosis not present

## 2021-01-25 DIAGNOSIS — I10 Essential (primary) hypertension: Secondary | ICD-10-CM | POA: Diagnosis not present

## 2021-01-25 DIAGNOSIS — N401 Enlarged prostate with lower urinary tract symptoms: Secondary | ICD-10-CM | POA: Diagnosis not present

## 2021-01-25 DIAGNOSIS — N39498 Other specified urinary incontinence: Secondary | ICD-10-CM | POA: Diagnosis not present

## 2021-01-31 DIAGNOSIS — I10 Essential (primary) hypertension: Secondary | ICD-10-CM | POA: Diagnosis not present

## 2021-01-31 DIAGNOSIS — E042 Nontoxic multinodular goiter: Secondary | ICD-10-CM | POA: Diagnosis not present

## 2021-01-31 DIAGNOSIS — N401 Enlarged prostate with lower urinary tract symptoms: Secondary | ICD-10-CM | POA: Diagnosis not present

## 2021-01-31 DIAGNOSIS — N39498 Other specified urinary incontinence: Secondary | ICD-10-CM | POA: Diagnosis not present

## 2021-01-31 DIAGNOSIS — G629 Polyneuropathy, unspecified: Secondary | ICD-10-CM | POA: Diagnosis not present

## 2021-01-31 DIAGNOSIS — D649 Anemia, unspecified: Secondary | ICD-10-CM | POA: Diagnosis not present

## 2021-02-01 DIAGNOSIS — G629 Polyneuropathy, unspecified: Secondary | ICD-10-CM | POA: Diagnosis not present

## 2021-02-01 DIAGNOSIS — N39498 Other specified urinary incontinence: Secondary | ICD-10-CM | POA: Diagnosis not present

## 2021-02-01 DIAGNOSIS — N401 Enlarged prostate with lower urinary tract symptoms: Secondary | ICD-10-CM | POA: Diagnosis not present

## 2021-02-01 DIAGNOSIS — D649 Anemia, unspecified: Secondary | ICD-10-CM | POA: Diagnosis not present

## 2021-02-01 DIAGNOSIS — I10 Essential (primary) hypertension: Secondary | ICD-10-CM | POA: Diagnosis not present

## 2021-02-01 DIAGNOSIS — E042 Nontoxic multinodular goiter: Secondary | ICD-10-CM | POA: Diagnosis not present

## 2021-02-07 DIAGNOSIS — E042 Nontoxic multinodular goiter: Secondary | ICD-10-CM | POA: Diagnosis not present

## 2021-02-07 DIAGNOSIS — D649 Anemia, unspecified: Secondary | ICD-10-CM | POA: Diagnosis not present

## 2021-02-07 DIAGNOSIS — N401 Enlarged prostate with lower urinary tract symptoms: Secondary | ICD-10-CM | POA: Diagnosis not present

## 2021-02-07 DIAGNOSIS — G629 Polyneuropathy, unspecified: Secondary | ICD-10-CM | POA: Diagnosis not present

## 2021-02-07 DIAGNOSIS — I10 Essential (primary) hypertension: Secondary | ICD-10-CM | POA: Diagnosis not present

## 2021-02-07 DIAGNOSIS — N39498 Other specified urinary incontinence: Secondary | ICD-10-CM | POA: Diagnosis not present

## 2021-02-08 DIAGNOSIS — E7800 Pure hypercholesterolemia, unspecified: Secondary | ICD-10-CM | POA: Diagnosis not present

## 2021-02-08 DIAGNOSIS — G629 Polyneuropathy, unspecified: Secondary | ICD-10-CM | POA: Diagnosis not present

## 2021-02-08 DIAGNOSIS — E291 Testicular hypofunction: Secondary | ICD-10-CM | POA: Diagnosis not present

## 2021-02-08 DIAGNOSIS — M25552 Pain in left hip: Secondary | ICD-10-CM | POA: Diagnosis not present

## 2021-02-08 DIAGNOSIS — D61818 Other pancytopenia: Secondary | ICD-10-CM | POA: Diagnosis not present

## 2021-02-08 DIAGNOSIS — N401 Enlarged prostate with lower urinary tract symptoms: Secondary | ICD-10-CM | POA: Diagnosis not present

## 2021-02-08 DIAGNOSIS — Z9181 History of falling: Secondary | ICD-10-CM | POA: Diagnosis not present

## 2021-02-08 DIAGNOSIS — M2560 Stiffness of unspecified joint, not elsewhere classified: Secondary | ICD-10-CM | POA: Diagnosis not present

## 2021-02-08 DIAGNOSIS — N39498 Other specified urinary incontinence: Secondary | ICD-10-CM | POA: Diagnosis not present

## 2021-02-08 DIAGNOSIS — D649 Anemia, unspecified: Secondary | ICD-10-CM | POA: Diagnosis not present

## 2021-02-08 DIAGNOSIS — E538 Deficiency of other specified B group vitamins: Secondary | ICD-10-CM | POA: Diagnosis not present

## 2021-02-08 DIAGNOSIS — Z86010 Personal history of colonic polyps: Secondary | ICD-10-CM | POA: Diagnosis not present

## 2021-02-08 DIAGNOSIS — E042 Nontoxic multinodular goiter: Secondary | ICD-10-CM | POA: Diagnosis not present

## 2021-02-08 DIAGNOSIS — I10 Essential (primary) hypertension: Secondary | ICD-10-CM | POA: Diagnosis not present

## 2021-02-09 DIAGNOSIS — G629 Polyneuropathy, unspecified: Secondary | ICD-10-CM | POA: Diagnosis not present

## 2021-02-09 DIAGNOSIS — E042 Nontoxic multinodular goiter: Secondary | ICD-10-CM | POA: Diagnosis not present

## 2021-02-09 DIAGNOSIS — N39498 Other specified urinary incontinence: Secondary | ICD-10-CM | POA: Diagnosis not present

## 2021-02-09 DIAGNOSIS — N401 Enlarged prostate with lower urinary tract symptoms: Secondary | ICD-10-CM | POA: Diagnosis not present

## 2021-02-09 DIAGNOSIS — D649 Anemia, unspecified: Secondary | ICD-10-CM | POA: Diagnosis not present

## 2021-02-09 DIAGNOSIS — I10 Essential (primary) hypertension: Secondary | ICD-10-CM | POA: Diagnosis not present

## 2021-02-15 DIAGNOSIS — D649 Anemia, unspecified: Secondary | ICD-10-CM | POA: Diagnosis not present

## 2021-02-15 DIAGNOSIS — E042 Nontoxic multinodular goiter: Secondary | ICD-10-CM | POA: Diagnosis not present

## 2021-02-15 DIAGNOSIS — N401 Enlarged prostate with lower urinary tract symptoms: Secondary | ICD-10-CM | POA: Diagnosis not present

## 2021-02-15 DIAGNOSIS — N39498 Other specified urinary incontinence: Secondary | ICD-10-CM | POA: Diagnosis not present

## 2021-02-15 DIAGNOSIS — I10 Essential (primary) hypertension: Secondary | ICD-10-CM | POA: Diagnosis not present

## 2021-02-15 DIAGNOSIS — G629 Polyneuropathy, unspecified: Secondary | ICD-10-CM | POA: Diagnosis not present

## 2021-02-22 DIAGNOSIS — N401 Enlarged prostate with lower urinary tract symptoms: Secondary | ICD-10-CM | POA: Diagnosis not present

## 2021-02-22 DIAGNOSIS — I10 Essential (primary) hypertension: Secondary | ICD-10-CM | POA: Diagnosis not present

## 2021-02-22 DIAGNOSIS — N39498 Other specified urinary incontinence: Secondary | ICD-10-CM | POA: Diagnosis not present

## 2021-02-22 DIAGNOSIS — E042 Nontoxic multinodular goiter: Secondary | ICD-10-CM | POA: Diagnosis not present

## 2021-02-22 DIAGNOSIS — G629 Polyneuropathy, unspecified: Secondary | ICD-10-CM | POA: Diagnosis not present

## 2021-02-22 DIAGNOSIS — D649 Anemia, unspecified: Secondary | ICD-10-CM | POA: Diagnosis not present

## 2021-03-01 DIAGNOSIS — E042 Nontoxic multinodular goiter: Secondary | ICD-10-CM | POA: Diagnosis not present

## 2021-03-01 DIAGNOSIS — G629 Polyneuropathy, unspecified: Secondary | ICD-10-CM | POA: Diagnosis not present

## 2021-03-01 DIAGNOSIS — N401 Enlarged prostate with lower urinary tract symptoms: Secondary | ICD-10-CM | POA: Diagnosis not present

## 2021-03-01 DIAGNOSIS — N39498 Other specified urinary incontinence: Secondary | ICD-10-CM | POA: Diagnosis not present

## 2021-03-01 DIAGNOSIS — D649 Anemia, unspecified: Secondary | ICD-10-CM | POA: Diagnosis not present

## 2021-03-01 DIAGNOSIS — I10 Essential (primary) hypertension: Secondary | ICD-10-CM | POA: Diagnosis not present

## 2021-03-09 DIAGNOSIS — N401 Enlarged prostate with lower urinary tract symptoms: Secondary | ICD-10-CM | POA: Diagnosis not present

## 2021-03-09 DIAGNOSIS — D649 Anemia, unspecified: Secondary | ICD-10-CM | POA: Diagnosis not present

## 2021-03-09 DIAGNOSIS — I10 Essential (primary) hypertension: Secondary | ICD-10-CM | POA: Diagnosis not present

## 2021-03-09 DIAGNOSIS — N39498 Other specified urinary incontinence: Secondary | ICD-10-CM | POA: Diagnosis not present

## 2021-03-09 DIAGNOSIS — G629 Polyneuropathy, unspecified: Secondary | ICD-10-CM | POA: Diagnosis not present

## 2021-03-09 DIAGNOSIS — E042 Nontoxic multinodular goiter: Secondary | ICD-10-CM | POA: Diagnosis not present

## 2021-03-16 DIAGNOSIS — Z20822 Contact with and (suspected) exposure to covid-19: Secondary | ICD-10-CM | POA: Diagnosis not present

## 2021-03-21 DIAGNOSIS — G629 Polyneuropathy, unspecified: Secondary | ICD-10-CM | POA: Diagnosis not present

## 2021-03-21 DIAGNOSIS — N401 Enlarged prostate with lower urinary tract symptoms: Secondary | ICD-10-CM | POA: Diagnosis not present

## 2021-03-21 DIAGNOSIS — D649 Anemia, unspecified: Secondary | ICD-10-CM | POA: Diagnosis not present

## 2021-03-21 DIAGNOSIS — N39498 Other specified urinary incontinence: Secondary | ICD-10-CM | POA: Diagnosis not present

## 2021-03-31 DIAGNOSIS — N401 Enlarged prostate with lower urinary tract symptoms: Secondary | ICD-10-CM | POA: Diagnosis not present

## 2021-03-31 DIAGNOSIS — E7800 Pure hypercholesterolemia, unspecified: Secondary | ICD-10-CM | POA: Diagnosis not present

## 2021-03-31 DIAGNOSIS — E538 Deficiency of other specified B group vitamins: Secondary | ICD-10-CM | POA: Diagnosis not present

## 2021-03-31 DIAGNOSIS — I10 Essential (primary) hypertension: Secondary | ICD-10-CM | POA: Diagnosis not present

## 2021-03-31 DIAGNOSIS — D61818 Other pancytopenia: Secondary | ICD-10-CM | POA: Diagnosis not present

## 2021-03-31 DIAGNOSIS — D649 Anemia, unspecified: Secondary | ICD-10-CM | POA: Diagnosis not present

## 2021-03-31 DIAGNOSIS — R634 Abnormal weight loss: Secondary | ICD-10-CM | POA: Diagnosis not present

## 2021-04-13 DIAGNOSIS — M25552 Pain in left hip: Secondary | ICD-10-CM | POA: Insufficient documentation

## 2021-04-29 DIAGNOSIS — Z20822 Contact with and (suspected) exposure to covid-19: Secondary | ICD-10-CM | POA: Diagnosis not present

## 2021-06-28 DIAGNOSIS — Z20822 Contact with and (suspected) exposure to covid-19: Secondary | ICD-10-CM | POA: Diagnosis not present

## 2021-07-12 DIAGNOSIS — Z20828 Contact with and (suspected) exposure to other viral communicable diseases: Secondary | ICD-10-CM | POA: Diagnosis not present

## 2021-07-27 DIAGNOSIS — E291 Testicular hypofunction: Secondary | ICD-10-CM | POA: Diagnosis not present

## 2021-07-27 DIAGNOSIS — R609 Edema, unspecified: Secondary | ICD-10-CM | POA: Diagnosis not present

## 2021-07-27 DIAGNOSIS — I10 Essential (primary) hypertension: Secondary | ICD-10-CM | POA: Diagnosis not present

## 2021-07-27 DIAGNOSIS — E7800 Pure hypercholesterolemia, unspecified: Secondary | ICD-10-CM | POA: Diagnosis not present

## 2021-07-27 DIAGNOSIS — R2681 Unsteadiness on feet: Secondary | ICD-10-CM | POA: Diagnosis not present

## 2021-07-27 DIAGNOSIS — G629 Polyneuropathy, unspecified: Secondary | ICD-10-CM | POA: Diagnosis not present

## 2021-07-27 DIAGNOSIS — N401 Enlarged prostate with lower urinary tract symptoms: Secondary | ICD-10-CM | POA: Diagnosis not present

## 2021-08-03 DIAGNOSIS — Z20822 Contact with and (suspected) exposure to covid-19: Secondary | ICD-10-CM | POA: Diagnosis not present

## 2021-08-04 ENCOUNTER — Emergency Department (HOSPITAL_COMMUNITY): Payer: Medicare Other

## 2021-08-04 ENCOUNTER — Encounter (HOSPITAL_COMMUNITY): Payer: Self-pay | Admitting: Internal Medicine

## 2021-08-04 ENCOUNTER — Inpatient Hospital Stay (HOSPITAL_COMMUNITY)
Admission: EM | Admit: 2021-08-04 | Discharge: 2021-08-09 | DRG: 564 | Disposition: A | Discharge status: Home Health | Payer: Medicare Other | Attending: Internal Medicine | Admitting: Internal Medicine

## 2021-08-04 DIAGNOSIS — S199XXA Unspecified injury of neck, initial encounter: Secondary | ICD-10-CM | POA: Diagnosis not present

## 2021-08-04 DIAGNOSIS — G928 Other toxic encephalopathy: Secondary | ICD-10-CM | POA: Diagnosis not present

## 2021-08-04 DIAGNOSIS — R55 Syncope and collapse: Secondary | ICD-10-CM | POA: Diagnosis not present

## 2021-08-04 DIAGNOSIS — M6282 Rhabdomyolysis: Secondary | ICD-10-CM | POA: Diagnosis not present

## 2021-08-04 DIAGNOSIS — S0990XA Unspecified injury of head, initial encounter: Secondary | ICD-10-CM | POA: Diagnosis not present

## 2021-08-04 DIAGNOSIS — D72819 Decreased white blood cell count, unspecified: Secondary | ICD-10-CM | POA: Diagnosis present

## 2021-08-04 DIAGNOSIS — Z043 Encounter for examination and observation following other accident: Secondary | ICD-10-CM | POA: Diagnosis not present

## 2021-08-04 DIAGNOSIS — M50322 Other cervical disc degeneration at C5-C6 level: Secondary | ICD-10-CM | POA: Diagnosis not present

## 2021-08-04 DIAGNOSIS — R7989 Other specified abnormal findings of blood chemistry: Secondary | ICD-10-CM | POA: Diagnosis present

## 2021-08-04 DIAGNOSIS — M542 Cervicalgia: Secondary | ICD-10-CM | POA: Diagnosis not present

## 2021-08-04 DIAGNOSIS — D631 Anemia in chronic kidney disease: Secondary | ICD-10-CM | POA: Diagnosis present

## 2021-08-04 DIAGNOSIS — M7989 Other specified soft tissue disorders: Secondary | ICD-10-CM | POA: Diagnosis not present

## 2021-08-04 DIAGNOSIS — Y92008 Other place in unspecified non-institutional (private) residence as the place of occurrence of the external cause: Secondary | ICD-10-CM | POA: Diagnosis not present

## 2021-08-04 DIAGNOSIS — R410 Disorientation, unspecified: Secondary | ICD-10-CM

## 2021-08-04 DIAGNOSIS — M47816 Spondylosis without myelopathy or radiculopathy, lumbar region: Secondary | ICD-10-CM | POA: Diagnosis not present

## 2021-08-04 DIAGNOSIS — N1832 Chronic kidney disease, stage 3b: Secondary | ICD-10-CM | POA: Diagnosis present

## 2021-08-04 DIAGNOSIS — W19XXXA Unspecified fall, initial encounter: Secondary | ICD-10-CM | POA: Diagnosis not present

## 2021-08-04 DIAGNOSIS — N400 Enlarged prostate without lower urinary tract symptoms: Secondary | ICD-10-CM | POA: Diagnosis not present

## 2021-08-04 DIAGNOSIS — T796XXA Traumatic ischemia of muscle, initial encounter: Principal | ICD-10-CM | POA: Diagnosis present

## 2021-08-04 DIAGNOSIS — N179 Acute kidney failure, unspecified: Secondary | ICD-10-CM | POA: Diagnosis not present

## 2021-08-04 DIAGNOSIS — Z8249 Family history of ischemic heart disease and other diseases of the circulatory system: Secondary | ICD-10-CM | POA: Diagnosis not present

## 2021-08-04 DIAGNOSIS — D696 Thrombocytopenia, unspecified: Secondary | ICD-10-CM | POA: Diagnosis not present

## 2021-08-04 DIAGNOSIS — R778 Other specified abnormalities of plasma proteins: Secondary | ICD-10-CM | POA: Diagnosis present

## 2021-08-04 DIAGNOSIS — R4781 Slurred speech: Secondary | ICD-10-CM | POA: Diagnosis not present

## 2021-08-04 DIAGNOSIS — L89890 Pressure ulcer of other site, unstageable: Secondary | ICD-10-CM | POA: Diagnosis not present

## 2021-08-04 DIAGNOSIS — M50323 Other cervical disc degeneration at C6-C7 level: Secondary | ICD-10-CM | POA: Diagnosis not present

## 2021-08-04 DIAGNOSIS — E538 Deficiency of other specified B group vitamins: Secondary | ICD-10-CM | POA: Diagnosis present

## 2021-08-04 DIAGNOSIS — E291 Testicular hypofunction: Secondary | ICD-10-CM | POA: Diagnosis present

## 2021-08-04 DIAGNOSIS — J90 Pleural effusion, not elsewhere classified: Secondary | ICD-10-CM | POA: Diagnosis present

## 2021-08-04 DIAGNOSIS — Z79899 Other long term (current) drug therapy: Secondary | ICD-10-CM | POA: Diagnosis not present

## 2021-08-04 DIAGNOSIS — I700 Atherosclerosis of aorta: Secondary | ICD-10-CM | POA: Diagnosis present

## 2021-08-04 DIAGNOSIS — M1612 Unilateral primary osteoarthritis, left hip: Secondary | ICD-10-CM | POA: Diagnosis not present

## 2021-08-04 LAB — COMPREHENSIVE METABOLIC PANEL
ALT: 67 U/L — ABNORMAL HIGH (ref 0–44)
AST: 192 U/L — ABNORMAL HIGH (ref 15–41)
Albumin: 3.5 g/dL (ref 3.5–5.0)
Alkaline Phosphatase: 62 U/L (ref 38–126)
Anion gap: 10 (ref 5–15)
BUN: 33 mg/dL — ABNORMAL HIGH (ref 8–23)
CO2: 21 mmol/L — ABNORMAL LOW (ref 22–32)
Calcium: 8.8 mg/dL — ABNORMAL LOW (ref 8.9–10.3)
Chloride: 108 mmol/L (ref 98–111)
Creatinine, Ser: 1.21 mg/dL (ref 0.61–1.24)
GFR, Estimated: 55 mL/min — ABNORMAL LOW (ref >60)
Glucose, Bld: 100 mg/dL — ABNORMAL HIGH (ref 70–99)
Potassium: 5.1 mmol/L (ref 3.5–5.1)
Sodium: 139 mmol/L (ref 135–145)
Total Bilirubin: 1.4 mg/dL — ABNORMAL HIGH (ref 0.3–1.2)
Total Protein: 6.1 g/dL — ABNORMAL LOW (ref 6.5–8.1)

## 2021-08-04 LAB — URINALYSIS, ROUTINE W REFLEX MICROSCOPIC
Bacteria, UA: NONE SEEN
Bilirubin Urine: NEGATIVE
Glucose, UA: NEGATIVE mg/dL
Ketones, ur: 20 mg/dL — AB
Leukocytes,Ua: NEGATIVE
Nitrite: NEGATIVE
Protein, ur: 30 mg/dL — AB
Specific Gravity, Urine: 1.043 — ABNORMAL HIGH (ref 1.005–1.030)
pH: 5 (ref 5.0–8.0)

## 2021-08-04 LAB — RAPID URINE DRUG SCREEN, HOSP PERFORMED
Amphetamines: NOT DETECTED
Barbiturates: NOT DETECTED
Benzodiazepines: NOT DETECTED
Cocaine: NOT DETECTED
Opiates: NOT DETECTED
Tetrahydrocannabinol: NOT DETECTED

## 2021-08-04 LAB — CBC WITH DIFFERENTIAL/PLATELET
Abs Immature Granulocytes: 0.03 10*3/uL (ref 0.00–0.07)
Basophils Absolute: 0 10*3/uL (ref 0.0–0.1)
Basophils Relative: 0 %
Eosinophils Absolute: 0 10*3/uL (ref 0.0–0.5)
Eosinophils Relative: 0 %
HCT: 39.5 % (ref 39.0–52.0)
Hemoglobin: 13.1 g/dL (ref 13.0–17.0)
Immature Granulocytes: 0 %
Lymphocytes Relative: 6 %
Lymphs Abs: 0.5 10*3/uL — ABNORMAL LOW (ref 0.7–4.0)
MCH: 32.6 pg (ref 26.0–34.0)
MCHC: 33.2 g/dL (ref 30.0–36.0)
MCV: 98.3 fL (ref 80.0–100.0)
Monocytes Absolute: 0.6 10*3/uL (ref 0.1–1.0)
Monocytes Relative: 8 %
Neutro Abs: 6.8 10*3/uL (ref 1.7–7.7)
Neutrophils Relative %: 86 %
Platelets: 137 10*3/uL — ABNORMAL LOW (ref 150–400)
RBC: 4.02 MIL/uL — ABNORMAL LOW (ref 4.22–5.81)
RDW: 12.7 % (ref 11.5–15.5)
WBC: 7.9 10*3/uL (ref 4.0–10.5)
nRBC: 0 % (ref 0.0–0.2)

## 2021-08-04 LAB — TROPONIN I (HIGH SENSITIVITY)
Troponin I (High Sensitivity): 127 ng/L (ref <18)
Troponin I (High Sensitivity): 151 ng/L (ref <18)
Troponin I (High Sensitivity): 152 ng/L (ref <18)

## 2021-08-04 LAB — BLOOD GAS, VENOUS
Acid-base deficit: 0.6 mmol/L (ref 0.0–2.0)
Bicarbonate: 25.4 mmol/L (ref 20.0–28.0)
O2 Saturation: 58.1 %
Patient temperature: 37
pCO2, Ven: 46 mmHg (ref 44–60)
pH, Ven: 7.35 (ref 7.25–7.43)
pO2, Ven: 32 mmHg (ref 32–45)

## 2021-08-04 LAB — CK: Total CK: 3986 U/L — ABNORMAL HIGH (ref 49–397)

## 2021-08-04 MED ORDER — ONDANSETRON HCL 4 MG/2ML IJ SOLN: 4.0000 mg | Freq: Four times a day (QID) | INTRAMUSCULAR | Status: DC | PRN

## 2021-08-04 MED ORDER — ENOXAPARIN SODIUM 40 MG/0.4ML IJ SOSY
40.0000 mg | PREFILLED_SYRINGE | INTRAMUSCULAR | Status: DC
  Administered 2021-08-04 – 2021-08-08 (×5): 40 mg via SUBCUTANEOUS
  Filled 2021-08-04 (×5): qty 0.4

## 2021-08-04 MED ORDER — IOHEXOL 350 MG/ML SOLN
75.0000 mL | Freq: Once | INTRAVENOUS | Status: AC | PRN
Start: 2021-08-04 — End: 2021-08-04
  Administered 2021-08-04: 75 mL via INTRAVENOUS

## 2021-08-04 MED ORDER — GABAPENTIN 300 MG PO CAPS: 300.0000 mg | ORAL_CAPSULE | Freq: Two times a day (BID) | ORAL | Status: DC

## 2021-08-04 MED ORDER — ACETAMINOPHEN 650 MG RE SUPP: 650.0000 mg | Freq: Four times a day (QID) | RECTAL | Status: DC | PRN

## 2021-08-04 MED ORDER — TAMSULOSIN HCL 0.4 MG PO CAPS: 0.4000 mg | ORAL_CAPSULE | Freq: Every day | ORAL | Status: DC

## 2021-08-04 MED ORDER — LACTATED RINGERS IV SOLN: INTRAVENOUS | Status: DC

## 2021-08-04 MED ORDER — SODIUM CHLORIDE (PF) 0.9 % IJ SOLN
INTRAMUSCULAR | Status: AC
Start: 2021-08-04 — End: 2021-08-04
  Filled 2021-08-04: qty 50

## 2021-08-04 MED ORDER — SODIUM CHLORIDE 0.9 % IV BOLUS
1000.0000 mL | Freq: Once | INTRAVENOUS | Status: AC
  Administered 2021-08-04: 1000 mL via INTRAVENOUS

## 2021-08-04 MED ORDER — SODIUM CHLORIDE 0.9 % IV SOLN: INTRAVENOUS | Status: DC

## 2021-08-04 MED ORDER — FUROSEMIDE 20 MG PO TABS: 20.0000 mg | ORAL_TABLET | Freq: Every day | ORAL | Status: DC | PRN

## 2021-08-04 MED ORDER — ACETAMINOPHEN 325 MG PO TABS
650.0000 mg | ORAL_TABLET | Freq: Four times a day (QID) | ORAL | Status: DC | PRN
  Administered 2021-08-05 – 2021-08-08 (×4): 650 mg via ORAL
  Filled 2021-08-04 (×4): qty 2

## 2021-08-04 MED ORDER — ONDANSETRON HCL 4 MG PO TABS: 4.0000 mg | ORAL_TABLET | Freq: Four times a day (QID) | ORAL | Status: DC | PRN

## 2021-08-04 NOTE — ED Notes (Signed)
Patient transported to CT 

## 2021-08-04 NOTE — ED Triage Notes (Signed)
Pt BIBA from home, found on floor for undetermined period. Son spoke to pt on Sunday. Neighbor went to house next day and knocked with no answer, same yesterday, EMS noted papers out front since Tuesday. A&Ox3. No odor noted, pt not soiled. Some redness noted to right hip and small abrasion to right outer calf. Does not remember what happened. Arrives with c-collar in place ? ?128/76 ?HR 80 ?RR 18 ?98% RA ?CBG 109 ?

## 2021-08-04 NOTE — ED Notes (Signed)
ED TO INPATIENT HANDOFF REPORT ? ?Name/Age/Gender ?John Rose ?88 y.o. ?male ? ?Code Status ? ?  ?Code Status Orders  ?(From admission, onward)  ?  ? ? ?  ? ?  Start     Ordered  ? 08/04/21 1521  Full code  Continuous       ? 08/04/21 1520  ? ?  ?  ? ?  ? ?Code Status History   ? ? This patient has a current code status but no historical code status.  ? ?  ? ? ?Home/SNF/Other ?Home ? ?Chief Complaint ?Rhabdomyolysis [M62.82] ? ?Level of Care/Admitting Diagnosis ?ED Disposition   ? ? ED Disposition  ?Admit  ? Condition  ?--  ? Comment  ?Hospital Area: [facility] [[id number]] ? Level of Care: Progressive [102] ? Admit to Progressive based on following criteria: GI, ENDOCRINE disease patients with GI bleeding, acute liver failure or pancreatitis, stable with diabetic ketoacidosis or thyrotoxicosis (hypothyroid) state. ? May place patient in observation at [facility] or [facility] if equivalent level of care is available:: No ? Covid Evaluation: Asymptomatic - no recent exposure (last 10 days) testing not required ? Diagnosis: Rhabdomyolysis [728.88.ICD-9-CM] ? Admitting Physician: Bobette Mo [[id number]] ? Attending Physician: Bobette Mo [[id number]] ?  ?  ? ?  ? ? ?Medical History ?Past Medical History:  ?Diagnosis Date  ? Lumbar spondylosis 11/10/2016  ? Testosterone deficiency   ? ? ?Allergies ?No Known Allergies ? ?IV Location/Drains/Wounds ?Patient Lines/Drains/Airways Status   ? ? Active Line/Drains/Airways   ? ? Name Placement date Placement time Site Days  ? Peripheral IV 08/04/21 20 G 1" Right Antecubital 08/04/21  1032  Antecubital  less than 1  ? ?  ?  ? ?  ? ? ?Labs/Imaging ?Results for orders placed or performed during the hospital encounter of 08/04/21 (from the past 48 hour(s))  ?CK     Status: Abnormal  ? Collection Time: 08/04/21 10:26 AM  ?Result Value Ref Range  ? Total CK 3,986 (H) 49 - 397 U/L  ?  Comment: Performed at [facility], [address], [place], [place] [address]  ?Comprehensive metabolic panel     Status: Abnormal  ? Collection Time: 08/04/21 10:26 AM  ?Result Value Ref Range  ? Sodium 139 135 - 145 mmol/L  ? Potassium 5.1 3.5 - 5.1 mmol/L  ? Chloride 108 98 - 111 mmol/L  ? CO2 21 (L) 22 - 32 mmol/L  ? Glucose, Bld 100 (H) 70 - 99 mg/dL  ?  Comment: Glucose reference range applies only to samples taken after fasting for at least 8 hours.  ? BUN 33 (H) 8 - 23 mg/dL  ? Creatinine, Ser 1.21 0.61 - 1.24 mg/dL  ? Calcium 8.8 (L) 8.9 - 10.3 mg/dL  ? Total Protein 6.1 (L) 6.5 - 8.1 g/dL  ? Albumin 3.5 3.5 - 5.0 g/dL  ? AST 192 (H) 15 - 41 U/L  ? ALT 67 (H) 0 - 44 U/L  ? Alkaline Phosphatase 62 38 - 126 U/L  ? Total Bilirubin 1.4 (H) 0.3 - 1.2 mg/dL  ? GFR, Estimated 55 (L) >60 mL/min  ?  Comment: (NOTE) ?Calculated using the CKD-EPI Creatinine Equation (2021) ?  ? Anion gap 10 5 - 15  ?  Comment: Performed at [facility], [address], [place], [place] [address]  ?CBC with Differential     Status: Abnormal  ? Collection Time: 08/04/21 10:26 AM  ?  Result Value Ref Range  ? WBC 7.9 4.0 - 10.5 K/uL  ? RBC 4.02 (L) 4.22 - 5.81 MIL/uL  ? Hemoglobin 13.1 13.0 - 17.0 g/dL  ? HCT 39.5 39.0 - 52.0 %  ? MCV 98.3 80.0 - 100.0 fL  ? MCH 32.6 26.0 - 34.0 pg  ? MCHC 33.2 30.0 - 36.0 g/dL  ? RDW 12.7 11.5 - 15.5 %  ? Platelets 137 (L) 150 - 400 K/uL  ? nRBC 0.0 0.0 - 0.2 %  ? Neutrophils Relative % 86 %  ? Neutro Abs 6.8 1.7 - 7.7 K/uL  ? Lymphocytes Relative 6 %  ? Lymphs Abs 0.5 (L) 0.7 - 4.0 K/uL  ? Monocytes Relative 8 %  ? Monocytes Absolute 0.6 0.1 - 1.0 K/uL  ? Eosinophils Relative 0 %  ? Eosinophils Absolute 0.0 0.0 - 0.5 K/uL  ? Basophils Relative 0 %  ? Basophils Absolute 0.0 0.0 - 0.1 K/uL  ? Immature Granulocytes 0 %  ? Abs Immature Granulocytes 0.03 0.00 - 0.07 K/uL  ?  Comment: Performed at [facility], [address], [place], [place] [address]  ?Troponin I (High Sensitivity)     Status: Abnormal  ? Collection  Time: 08/04/21 10:26 AM  ?Result Value Ref Range  ? Troponin I (High Sensitivity) 127 (HH) <18 ng/L  ?  Comment: CRITICAL RESULT CALLED TO, READ BACK BY AND VERIFIED WITH: ?SAVOIE,B. RN AT 1153 08/04/21 MULLINS,T ?(NOTE) ?Elevated high sensitivity troponin I (hsTnI) values and significant  ?changes across serial measurements may suggest ACS but many other  ?chronic and acute conditions are known to elevate hsTnI results.  ?Refer to the Links section for chest pain algorithms and additional  ?guidance. ?Performed at [facility], [address] ?[place], [place] [address] ?  ?Urinalysis, Routine w reflex microscopic Urine, Clean Catch     Status: Abnormal  ? Collection Time: 08/04/21 11:11 AM  ?Result Value Ref Range  ? Color, Urine YELLOW YELLOW  ? APPearance HAZY (A) CLEAR  ? Specific Gravity, Urine 1.043 (H) 1.005 - 1.030  ? pH 5.0 5.0 - 8.0  ? Glucose, UA NEGATIVE NEGATIVE mg/dL  ? Hgb urine dipstick MODERATE (A) NEGATIVE  ? Bilirubin Urine NEGATIVE NEGATIVE  ? Ketones, ur 20 (A) NEGATIVE mg/dL  ? Protein, ur 30 (A) NEGATIVE mg/dL  ? Nitrite NEGATIVE NEGATIVE  ? Leukocytes,Ua NEGATIVE NEGATIVE  ? RBC / HPF 0-5 0 - 5 RBC/hpf  ? WBC, UA 0-5 0 - 5 WBC/hpf  ? Bacteria, UA NONE SEEN NONE SEEN  ? Squamous Epithelial / LPF 0-5 0 - 5  ? Mucus PRESENT   ? Hyaline Casts, UA PRESENT   ?  Comment: Performed at [facility], [address] [place], [place] [address]  ?Rapid urine drug screen (hospital performed)     Status: None  ? Collection Time: 08/04/21 11:11 AM  ?Result Value Ref Range  ? Opiates NONE DETECTED NONE DETECTED  ? Cocaine NONE DETECTED NONE DETECTED  ? Benzodiazepines NONE DETECTED NONE DETECTED  ? Amphetamines NONE DETECTED NONE DETECTED  ? Tetrahydrocannabinol NONE DETECTED NONE DETECTED  ? Barbiturates NONE DETECTED NONE DETECTED  ?  Comment: (NOTE) ?DRUG SCREEN FOR MEDICAL PURPOSES ?ONLY.  IF CONFIRMATION IS NEEDED ?FOR ANY PURPOSE, NOTIFY LAB ?WITHIN 5  DAYS. ? ?LOWEST DETECTABLE LIMITS ?FOR URINE DRUG SCREEN ?Drug Class  Cutoff (ng/mL) ?Amphetamine and metabolites    1000 ?Barbiturate and metabolites    200 ?Benzodiazepine                 200 ?Tricyclics and metabolites     300 ?Opiates and metabolites        300 ?Cocaine and metabolites        300 ?THC                            50 ?Performed at [facility], [address] ?[place], [place] [address] ?  ?Blood gas, venous (at [facility] and [facility], not at [facility])     Status: None  ? Collection Time: 08/04/21 12:44 PM  ?Result Value Ref Range  ? pH, Ven 7.35 7.25 - 7.43  ? pCO2, Ven 46 44 - 60 mmHg  ? pO2, Ven 32 32 - 45 mmHg  ? Bicarbonate 25.4 20.0 - 28.0 mmol/L  ? Acid-base deficit 0.6 0.0 - 2.0 mmol/L  ? O2 Saturation 58.1 %  ? Patient temperature 37.0   ?  Comment: Performed at [facility], [address] [place], [place] [address]  ?Troponin I (High Sensitivity)     Status: Abnormal  ? Collection Time: 08/04/21 12:44 PM  ?Result Value Ref Range  ? Troponin I (High Sensitivity) 151 (HH) <18 ng/L  ?  Comment: DELTA CHECK NOTED ?CRITICAL RESULT CALLED TO, READ BACK BY AND VERIFIED WITH: ?BLAIR,I. RN AT 1329 08/04/21 MULLINS,T ?Performed at [facility], [address] [place], [place] [address] ?  ? ?CT Head Wo Contrast ? ?Result Date: 08/04/2021 ?CLINICAL DATA:  Head trauma, minor (Age >= 65y) EXAM: CT HEAD WITHOUT CONTRAST TECHNIQUE: Contiguous axial images were obtained from the base of the skull through the vertex without intravenous contrast. RADIATION DOSE REDUCTION: This exam was performed according to the departmental dose-optimization program which includes automated exposure control, adjustment of the mA and/or kV according to patient size and/or use of iterative reconstruction technique. COMPARISON:  None Available. FINDINGS: Brain: There is no acute intracranial hemorrhage, mass effect, or edema. Gray-white differentiation is  preserved. There is no extra-axial fluid collection. Prominence of the ventricles and sulci reflects parenchymal volume loss. Patchy hypoattenuation in the supratentorial white matter is nonspecific but may reflect mild ch

## 2021-08-04 NOTE — ED Provider Notes (Signed)
?[facility]-EMERGENCY DEPT ?Provider Note ? ? ?CSN: [id number] ?Arrival date & time: 08/04/21  1004 ? ?  ? ?History ? ?Chief Complaint  ?Patient presents with  ? Altered Mental Status  ? Fall  ? ? ?John Rose is a 88 y.o. male. ? ? Patient as above with significant medical history as below, including lumbar spondylosis who presents to the ED with complaint of fall. ? ?Last known normal was Sunday evening.  Patient lives alone.  Spoke on the phone with his son at that point.  No complaints that time.  Patient reportedly spoke with girlfriend on Monday and was normal.  Since then girlfriend and son of been unable to contact him.  When son arrived at the patient's house (son lives out of state) patient would not answer the door.  He had to break the door down patient was lying in the floor of his living room on his right side.  Unable to get up.  Patient was somewhat confused at that time.  Does report he had fallen 3 times over the past week.  Unsure if he hit his head.   ? ?At this time patient denies any acute complaints.  He does not particularly recall the events leading up to his arrival to the hospital today. ? ? ?Past Medical History: ?11/10/2016: Lumbar spondylosis ?No date: Testosterone deficiency ? ?Past Surgical History: ?No date: NO PAST SURGERIES  ? ? ?The history is provided by the patient and a relative. No language interpreter was used.  ?Altered Mental Status ?Presenting symptoms: no confusion   ?Associated symptoms: no abdominal pain, no agitation, no fever, no headaches, no nausea, no palpitations, no rash and no vomiting   ?Fall ?Pertinent negatives include no chest pain, no abdominal pain, no headaches and no shortness of breath.  ? ?  ? ?Home Medications ?Prior to Admission medications   ?Medication Sig Start Date End Date Taking? Authorizing Provider  ?acetaminophen (TYLENOL) 650 MG CR tablet Take 650 mg by mouth every 8 (eight) hours as needed for pain.   Yes [provider]  ?furosemide (LASIX) 40 MG tablet Take 20 mg by mouth daily as needed for fluid.   Yes [provider]  ?ANDROGEL PUMP 20.25 MG/ACT (1.62%) GEL Apply 1 Dose topically daily. 10/25/16   [provider]  ?gabapentin (NEURONTIN) 300 MG capsule Take 300 mg by mouth 2 (two) times daily. 10/23/16   [provider]  ?tamsulosin (FLOMAX) 0.4 MG CAPS capsule Take 0.4 mg by mouth daily.  08/16/16   [provider]  ?   ? ?Allergies    ?Patient has no known allergies.   ? ?Review of Systems   ?Review of Systems  ?Constitutional:  Negative for chills and fever.  ?HENT:  Negative for facial swelling and trouble swallowing.   ?Eyes:  Negative for photophobia and visual disturbance.  ?Respiratory:  Negative for cough and shortness of breath.   ?Cardiovascular:  Negative for chest pain and palpitations.  ?Gastrointestinal:  Negative for abdominal pain, nausea and vomiting.  ?Endocrine: Negative for polydipsia and polyuria.  ?Genitourinary:  Negative for difficulty urinating and hematuria.  ?Musculoskeletal:  Negative for gait problem and joint swelling.  ?Skin:  Negative for pallor and rash.  ?Neurological:  Positive for syncope. Negative for headaches.  ?Psychiatric/Behavioral:  Negative for agitation and confusion.   ? ?Physical Exam ?Updated Vital Signs ?BP 133/89   Pulse 91   Temp 98.3 ?F (36.8 ?C) (Oral)   Resp 20  Ht 5\' 8"  (1.727 m)   SpO2 100%   BMI 22.96 kg/m?  ?Physical Exam ?Vitals and nursing note reviewed.  ?Constitutional:   ?   General: He is not in acute distress. ?   Appearance: Normal appearance. He is well-developed. He is not ill-appearing or diaphoretic.  ?HENT:  ?   Head: Normocephalic. Abrasion present. No raccoon eyes, Battle's sign, right periorbital erythema or left periorbital erythema.  ? ?   Right Ear: External ear normal.  ?   Left Ear: External ear normal.  ?   Mouth/Throat:  ?   Mouth: Mucous membranes are moist.  ?Eyes:  ?   General: No scleral  icterus. ?Cardiovascular:  ?   Rate and Rhythm: Normal rate and regular rhythm.  ?   Pulses: Normal pulses.  ?   Heart sounds: Normal heart sounds.  ?Pulmonary:  ?   Effort: Pulmonary effort is normal. No respiratory distress.  ?   Breath sounds: Normal breath sounds.  ?Abdominal:  ?   General: Abdomen is flat.  ?   Palpations: Abdomen is soft.  ?   Tenderness: There is no abdominal tenderness.  ?Musculoskeletal:     ?   General: Normal range of motion.  ?   Cervical back: Normal range of motion.  ?   Right lower leg: No edema.  ?   Left lower leg: No edema.  ?     Legs: ? ?Skin: ?   General: Skin is warm and dry.  ?   Capillary Refill: Capillary refill takes less than 2 seconds.  ?Neurological:  ?   Mental Status: He is alert and oriented to person, place, and time.  ?   GCS: GCS eye subscore is 4. GCS verbal subscore is 5. GCS motor subscore is 6.  ?   Cranial Nerves: Cranial nerves 2-12 are intact. No dysarthria.  ?   Sensory: Sensation is intact.  ?   Motor: Motor function is intact.  ?   Coordination: Coordination is intact.  ?   Comments: Poor compliance with neuro exam  ?Psychiatric:     ?   Mood and Affect: Mood normal.     ?   Behavior: Behavior normal.  ? ? ?ED Results / Procedures / Treatments   ?Labs ?(all labs ordered are listed, but only abnormal results are displayed) ?Labs Reviewed  ?CK - Abnormal; Notable for the following components:  ?    Result Value  ? Total CK 3,986 (*)   ? All other components within normal limits  ?COMPREHENSIVE METABOLIC PANEL - Abnormal; Notable for the following components:  ? CO2 21 (*)   ? Glucose, Bld 100 (*)   ? BUN 33 (*)   ? Calcium 8.8 (*)   ? Total Protein 6.1 (*)   ? AST 192 (*)   ? ALT 67 (*)   ? Total Bilirubin 1.4 (*)   ? GFR, Estimated 55 (*)   ? All other components within normal limits  ?CBC WITH DIFFERENTIAL/PLATELET - Abnormal; Notable for the following components:  ? RBC 4.02 (*)   ? Platelets 137 (*)   ? Lymphs Abs 0.5 (*)   ? All other components within  normal limits  ?URINALYSIS, ROUTINE W REFLEX MICROSCOPIC - Abnormal; Notable for the following components:  ? APPearance HAZY (*)   ? Specific Gravity, Urine 1.043 (*)   ? Hgb urine dipstick MODERATE (*)   ? Ketones, ur 20 (*)   ? Protein, ur 30 (*)   ?  All other components within normal limits  ?TROPONIN I (HIGH SENSITIVITY) - Abnormal; Notable for the following components:  ? Troponin I (High Sensitivity) 127 (*)   ? All other components within normal limits  ?TROPONIN I (HIGH SENSITIVITY) - Abnormal; Notable for the following components:  ? Troponin I (High Sensitivity) 151 (*)   ? All other components within normal limits  ?BLOOD GAS, VENOUS  ?RAPID URINE DRUG SCREEN, HOSP PERFORMED  ? ? ?EKG ?EKG Interpretation ? ?Date/Time:  Thursday Aug 04 2021 10:40:43 EDT ?Ventricular Rate:  78 ?PR Interval:  138 ?QRS Duration: 100 ?QT Interval:  416 ?QTC Calculation: 474 ?R Axis:   -73 ?Text Interpretation: Sinus rhythm Atrial premature complex LAD, consider left anterior fascicular block Anteroseptal infarct, old Interpretation limited secondary to artifact Confirmed by Tanda Rockers ([id number]) on 08/04/2021 1:06:46 PM ? ?Radiology ?CT Head Wo Contrast ? ?Result Date: 08/04/2021 ?CLINICAL DATA:  Head trauma, minor (Age >= 65y) EXAM: CT HEAD WITHOUT CONTRAST TECHNIQUE: Contiguous axial images were obtained from the base of the skull through the vertex without intravenous contrast. RADIATION DOSE REDUCTION: This exam was performed according to the departmental dose-optimization program which includes automated exposure control, adjustment of the mA and/or kV according to patient size and/or use of iterative reconstruction technique. COMPARISON:  None Available. FINDINGS: Brain: There is no acute intracranial hemorrhage, mass effect, or edema. Brigido Mera-white differentiation is preserved. There is no extra-axial fluid collection. Prominence of the ventricles and sulci reflects parenchymal volume loss. Patchy hypoattenuation in the  supratentorial white matter is nonspecific but may reflect mild chronic microvascular ischemic changes. Age indeterminate small vessel infarcts of the right basal ganglia and adjacent white matter and possibly the post

## 2021-08-04 NOTE — H&P (Signed)
?History and Physical  ? ? ?Patient: John Rose ZOX:[id number] DOB: 11/09/1922 ?DOA: 08/04/2021 ?DOS: the patient was seen and examined on 08/04/2021 ?PCP: Pearson GrippeKim, James, MD  ?Patient coming from: Home ? ?Chief Complaint:  ?Chief Complaint  ?Patient presents with  ? Altered Mental Status  ? Fall  ? ?HPI: John SacksMizell Beattie is a 88 y.o. male who lives alone at home with medical history significant of lumbar spondylosis, testosterone deficiency who was found on the floor for an unknown period of time.  His son and girlfriend spoke to him last on Monday.  His neighbor checked on him twice without an answer and EMS noticed that there were papers out in the front since Tuesday.  His son came from out of state and found him lying down on the floor.  He was mildly confused at the moment.  He reported that he had 3 falls in the past few days.  He denied loss of consciousness, injuring his head, chest pain, dyspnea, palpitations, nausea, emesis, diaphoresis or PND.  His appetite has been fair.  No abdominal pain, nausea, emesis, diarrhea, constipation, melena or hematochezia.  No flank pain, dysuria, frequency or hematuria. ? ?ED course: Initial vital signs were temperature 98.3 ?F, pulse 75, respirations 10, BP 137/98 mmHg O2 sat 99% on room air.  The patient received 1000 mL of NS bolus. ? ?Lab work: UDS was negative.  Urinalysis showed increase of specific gravity of 1.0 43, moderate hemoglobinuria, ketonuria 20 and proteinuria 30 mg/dL.  Microscopic examination revealed no bacteria with 0-5 RBC and 0-5 WBC.  White count 7.9, hemoglobin 13.1 g/dL platelets [id number].  Venous blood gases normal.  Troponin was 127 that 151 ng/L.  Total CK 3986 units/L.  CMP showed a CO2 of 21 mmol/L with all other electrolytes being normal after calcium corrected.  Glucose 100, BUN 33, creatinine 1.21 and total bilirubin 1.4 mg/dL.  Total protein 6.1 and albumin 3.5 g/dL.  AST 192, ALT 67 and alkaline phosphatase 62 units/L. ? ?Imaging: No fractures seen  on right humerus, right knee 4 views and right hip x-ray.  CT head no acute intracranial hemorrhagic vers H indeterminate small vessel infarcts and right basilar ganglia and adjacent white matter and possibly right cerebellum.  MRI did not show any evidence of intracranial abnormality but stated test was severely motion limited.  C-spine CT with no acute cervical spine fracture.  Chest radiograph showed minimal right pleural effusion.  CTA chest shows small right pleural effusion and aortic atherosclerosis, but no PE. ? ?Review of Systems: As mentioned in the history of present illness. All other systems reviewed and are negative. ?Past Medical History:  ?Diagnosis Date  ? Lumbar spondylosis 11/10/2016  ? Testosterone deficiency   ? ?Past Surgical History:  ?Procedure Laterality Date  ? NO PAST SURGERIES    ? ?Social History:  reports that he has never smoked. He has never used smokeless tobacco. He reports that he does not drink alcohol and does not use drugs. ? ?No Known Allergies ? ?Family History  ?Problem Relation Age of Onset  ? Hypertension Other   ? ? ?Prior to Admission medications   ?Medication Sig Start Date End Date Taking? Authorizing Provider  ?acetaminophen (TYLENOL) 650 MG CR tablet Take 650 mg by mouth every 8 (eight) hours as needed for pain.   Yes [provider]  ?furosemide (LASIX) 40 MG tablet Take 20 mg by mouth daily as needed for fluid.   Yes [provider]  ?ANDROGEL PUMP 20.25  MG/ACT (1.62%) GEL Apply 1 Dose topically daily. 10/25/16   [provider]  ?gabapentin (NEURONTIN) 300 MG capsule Take 300 mg by mouth 2 (two) times daily. 10/23/16   [provider]  ?tamsulosin (FLOMAX) 0.4 MG CAPS capsule Take 0.4 mg by mouth daily.  08/16/16   [provider]  ? ? ?Physical Exam: ?Vitals:  ? 08/04/21 1245 08/04/21 1315 08/04/21 1330 08/04/21 1630  ?BP: (!) 148/67 (!) 144/88 133/89 (!) 147/71  ?Pulse: 82 91 91 91  ?Resp: 17 18 20 14   ?Temp:       ?TempSrc:      ?SpO2: 99% 99% 100% 97%  ?Height:      ? ?Physical Exam ?Vitals and nursing note reviewed.  ?Constitutional:   ?   Appearance: He is normal weight.  ?HENT:  ?   Head: Normocephalic.  ?   Mouth/Throat:  ?   Mouth: Mucous membranes are dry.  ?Eyes:  ?   General: No scleral icterus. ?   Pupils: Pupils are equal, round, and reactive to light.  ?Neck:  ?   Vascular: No JVD.  ?Cardiovascular:  ?   Rate and Rhythm: Normal rate and regular rhythm.  ?   Heart sounds: S1 normal and S2 normal.  ?Pulmonary:  ?   Effort: Pulmonary effort is normal. No respiratory distress.  ?   Breath sounds: Normal breath sounds. No wheezing or rales.  ?Abdominal:  ?   General: There is no distension.  ?   Palpations: Abdomen is soft.  ?   Tenderness: There is no abdominal tenderness. There is no right CVA tenderness, left CVA tenderness or guarding.  ?Musculoskeletal:  ?   Right shoulder: Tenderness present. Decreased range of motion.  ?   Cervical back: Neck supple.  ?   Right lower leg: No edema.  ?   Left lower leg: No edema.  ?Skin: ?   General: Skin is warm and dry.  ?   Coloration: Skin is not jaundiced.  ?Neurological:  ?   General: No focal deficit present.  ?   Mental Status: He is alert and oriented to person, place, and time.  ?Psychiatric:     ?   Mood and Affect: Mood normal.     ?   Behavior: Behavior normal.  ? ? ?Data Reviewed: ? ?Results are pending, will review when available. ? ?Assessment and Plan: ?Principal Problem: ?  Rhabdomyolysis ?Observation/PCU. ?Continue IV fluids. ?Monitor intake and output. ?Follow-up CK level. ?Follow-up renal function electrolytes. ? ?Active Problems: ?  Elevated troponin ?Likely demand ischemia. ?Check echocardiogram. ?Trend troponin level. ? ?  Pleural effusion ?Continue furosemide as needed. ?Echocardiogram has been ordered. ? ?  Abnormal LFTs ?In the setting of volume depletion. ?Follow-up LFTs. ? ?  Thrombocytopenia (HCC) ?Monitor platelet count. ? ?  Aortic atherosclerosis  (HCC) ?Consider initiating statin. ?Follow-up with primary care provider. ? ? ? Advance Care Planning:   Code Status: Full Code  ? ?Consults:  ? ?Family Communication:  ? ?Severity of Illness: ?The appropriate patient status for this patient is OBSERVATION. Observation status is judged to be reasonable and necessary in order to provide the required intensity of service to ensure the patient's safety. The patient's presenting symptoms, physical exam findings, and initial radiographic and laboratory data in the context of their medical condition is felt to place them at decreased risk for further clinical deterioration. Furthermore, it is anticipated that the patient will be medically stable for discharge from the hospital within  2 midnights of admission.  ? ?Author: ?Bobette Mo, MD ?08/04/2021 6:57 PM ? ?For on call review [email or web address].  ? ?This document was prepared using Dragon voice recognition software and may contain some unintended transcription errors. ?

## 2021-08-05 ENCOUNTER — Other Ambulatory Visit: Payer: Self-pay

## 2021-08-05 ENCOUNTER — Observation Stay (HOSPITAL_COMMUNITY): Payer: Medicare Other

## 2021-08-05 DIAGNOSIS — I248 Other forms of acute ischemic heart disease: Secondary | ICD-10-CM | POA: Diagnosis not present

## 2021-08-05 DIAGNOSIS — E291 Testicular hypofunction: Secondary | ICD-10-CM | POA: Diagnosis not present

## 2021-08-05 DIAGNOSIS — N179 Acute kidney failure, unspecified: Secondary | ICD-10-CM | POA: Diagnosis not present

## 2021-08-05 DIAGNOSIS — Y92008 Other place in unspecified non-institutional (private) residence as the place of occurrence of the external cause: Secondary | ICD-10-CM | POA: Diagnosis not present

## 2021-08-05 DIAGNOSIS — J90 Pleural effusion, not elsewhere classified: Secondary | ICD-10-CM | POA: Diagnosis not present

## 2021-08-05 DIAGNOSIS — N400 Enlarged prostate without lower urinary tract symptoms: Secondary | ICD-10-CM | POA: Diagnosis not present

## 2021-08-05 DIAGNOSIS — R7989 Other specified abnormal findings of blood chemistry: Secondary | ICD-10-CM | POA: Diagnosis not present

## 2021-08-05 DIAGNOSIS — M6282 Rhabdomyolysis: Secondary | ICD-10-CM | POA: Diagnosis present

## 2021-08-05 DIAGNOSIS — R778 Other specified abnormalities of plasma proteins: Secondary | ICD-10-CM | POA: Diagnosis not present

## 2021-08-05 DIAGNOSIS — N1832 Chronic kidney disease, stage 3b: Secondary | ICD-10-CM | POA: Diagnosis not present

## 2021-08-05 DIAGNOSIS — W19XXXA Unspecified fall, initial encounter: Secondary | ICD-10-CM | POA: Diagnosis present

## 2021-08-05 DIAGNOSIS — E538 Deficiency of other specified B group vitamins: Secondary | ICD-10-CM | POA: Diagnosis not present

## 2021-08-05 DIAGNOSIS — D696 Thrombocytopenia, unspecified: Secondary | ICD-10-CM | POA: Diagnosis not present

## 2021-08-05 DIAGNOSIS — Z79899 Other long term (current) drug therapy: Secondary | ICD-10-CM | POA: Diagnosis not present

## 2021-08-05 DIAGNOSIS — G928 Other toxic encephalopathy: Secondary | ICD-10-CM | POA: Diagnosis not present

## 2021-08-05 DIAGNOSIS — M47816 Spondylosis without myelopathy or radiculopathy, lumbar region: Secondary | ICD-10-CM | POA: Diagnosis not present

## 2021-08-05 DIAGNOSIS — T796XXA Traumatic ischemia of muscle, initial encounter: Secondary | ICD-10-CM | POA: Diagnosis not present

## 2021-08-05 DIAGNOSIS — Z8249 Family history of ischemic heart disease and other diseases of the circulatory system: Secondary | ICD-10-CM | POA: Diagnosis not present

## 2021-08-05 DIAGNOSIS — D72819 Decreased white blood cell count, unspecified: Secondary | ICD-10-CM | POA: Diagnosis not present

## 2021-08-05 DIAGNOSIS — L89890 Pressure ulcer of other site, unstageable: Secondary | ICD-10-CM | POA: Diagnosis not present

## 2021-08-05 DIAGNOSIS — D631 Anemia in chronic kidney disease: Secondary | ICD-10-CM | POA: Diagnosis not present

## 2021-08-05 LAB — COMPREHENSIVE METABOLIC PANEL
ALT: 59 U/L — ABNORMAL HIGH (ref 0–44)
AST: 140 U/L — ABNORMAL HIGH (ref 15–41)
Albumin: 2.9 g/dL — ABNORMAL LOW (ref 3.5–5.0)
Alkaline Phosphatase: 52 U/L (ref 38–126)
Anion gap: 7 (ref 5–15)
BUN: 31 mg/dL — ABNORMAL HIGH (ref 8–23)
CO2: 22 mmol/L (ref 22–32)
Calcium: 8.3 mg/dL — ABNORMAL LOW (ref 8.9–10.3)
Chloride: 111 mmol/L (ref 98–111)
Creatinine, Ser: 1.18 mg/dL (ref 0.61–1.24)
GFR, Estimated: 56 mL/min — ABNORMAL LOW (ref >60)
Glucose, Bld: 129 mg/dL — ABNORMAL HIGH (ref 70–99)
Potassium: 4 mmol/L (ref 3.5–5.1)
Sodium: 140 mmol/L (ref 135–145)
Total Bilirubin: 1 mg/dL (ref 0.3–1.2)
Total Protein: 5 g/dL — ABNORMAL LOW (ref 6.5–8.1)

## 2021-08-05 LAB — ECHOCARDIOGRAM COMPLETE
AR max vel: 2.03 cm2
AV Peak grad: 9 mmHg
Ao pk vel: 1.5 m/s
Area-P 1/2: 3.53 cm2
Calc EF: 63.5 %
Height: 68 in
S' Lateral: 2.5 cm
Single Plane A2C EF: 61.3 %
Single Plane A4C EF: 63.1 %
Weight: 2518.54 oz

## 2021-08-05 LAB — CBC
HCT: 36.5 % — ABNORMAL LOW (ref 39.0–52.0)
Hemoglobin: 11.5 g/dL — ABNORMAL LOW (ref 13.0–17.0)
MCH: 32.3 pg (ref 26.0–34.0)
MCHC: 31.5 g/dL (ref 30.0–36.0)
MCV: 102.5 fL — ABNORMAL HIGH (ref 80.0–100.0)
Platelets: 115 10*3/uL — ABNORMAL LOW (ref 150–400)
RBC: 3.56 MIL/uL — ABNORMAL LOW (ref 4.22–5.81)
RDW: 13 % (ref 11.5–15.5)
WBC: 5.9 10*3/uL (ref 4.0–10.5)
nRBC: 0 % (ref 0.0–0.2)

## 2021-08-05 LAB — CK: Total CK: 2260 U/L — ABNORMAL HIGH (ref 49–397)

## 2021-08-05 MED ORDER — MEDIHONEY WOUND/BURN DRESSING EX PSTE
1.0000 "application " | PASTE | Freq: Every day | CUTANEOUS | Status: DC
  Administered 2021-08-05 – 2021-08-09 (×5): 1 via TOPICAL
  Filled 2021-08-05: qty 44

## 2021-08-05 NOTE — Consult Note (Signed)
WOC Nurse Consult Note: ?Patient receiving care in [id number]. ?Reason for Consult: RLE wound ?Wound type: unstageable PI ?Pressure Injury POA: Yes ?Measurement: 2.3 cm x 2.8 cm ?Wound bed: tan/yellow ?Drainage (amount, consistency, odor) none ?Periwound: intact ?Dressing procedure/placement/frequency: ?Apply Medihoney to right lateral lower leg wound (just below the knee) and cover with a foam dressing. ? ?Patient c/o of heels burning. No obvious PI to either heel at this time. I requested the [place] order 2 Prevalon boots to help relieve pressure to the heels. ? ?Monitor the wound area(s) for worsening of condition such as: ?Signs/symptoms of infection,  ?Increase in size,  ?Development of or worsening of odor, ?Development of pain, or increased pain at the affected locations.  Notify the medical team if any of these develop. ? ?Thank you for the consult.  Discussed plan of care with the patient and bedside nurse.  WOC nurse will not follow at this time.  Please re-consult the WOC team if needed. ? ?Helmut Muster, RN, MSN, CWOCN, CNS-BC, pager [phone]  ?

## 2021-08-05 NOTE — Progress Notes (Signed)
?PROGRESS NOTE ? ? 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 Anne HahnWillis of neurology , prior internal hemorrhoids back in 2009 ? ?Patient found on floor at home-neighbor checked on him twice-encephalopathic-specific gravity increased ketones present in urine ?White count 7 ?Total CK3 3986, AST 192/ALT 67 ?Troponin 127--151---imaging lower extremities normal-CT head no intracranial hemorrhage-MRI no intracranial abnormality ?C-spine CT no spinal fracture ?CTA chest small right pleural effusion no PE ? ?Echocardiogram done, Lasix held, IVF initiated ? ?Hospital-Problem based course ? ?Acute rhabdomyolysis secondary to fall and immobility as ?Improving--continue to trend until less than 500, LR 100 cc/H ?Trend LFT these are elevated 2/2 rhabdo-check Phos in a.m. 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 ?Troponin elevation ?2/2 AKI-no chest pain at this time--echocardiogram does not reveal any wall motion anomalies ?Right-sided deep tissue injury ?Covered 2 x 2 centimeter wound on right shin with Medihoney-appreciate wound care assistance ? ?DVT prophylaxis: Lovenox ?Code Status: Discussed with patient's son at the bedside ?Family Communication:  ?Disposition:   ?Status is: Observation ?The patient will require care spanning > 2 midnights and should be moved to inpatient because:  ? ?Has rhabdo and is immobile at this time needs therapy Nplate needs resolution of some of these issues ?  ?Consultants:  ? ? ?Procedures:  ? ?Antimicrobials:   ? ? ?Subjective: ?Awake coherent seems closer to his baseline according to his son ?No chest pain ?Ate breakfast and lunch so far ?Quite weak compared to his normal ? ? ?Objective: ?Vitals:  ? 08/04/21 2018 08/05/21 0009 08/05/21 0345 08/05/21 [id number]  ?BP: 130/86 130/64 138/75 (!) 146/78  ?Pulse: 94 89 92 91  ?Resp: 20 16 16 16   ?Temp: 98.5 ?F (36.9 ?C) 98.4 ?F (36.9 ?C) 98 ?F (36.7 ?C) 98.2 ?F (36.8 ?C)  ?TempSrc: Oral Oral Oral   ?SpO2: 93% 99% 98% 98%  ?Weight: 71.4 kg     ?Height: 5\' 8"  (1.727 m)     ? ? ?Intake/Output Summary (Last 24 hours) at 08/05/2021 0759 ?Last data filed at 08/05/2021 0600 ?Gross per 24 hour  ?Intake 2533.31 ml  ?Output 250 ml  ?Net 2283.31 ml  ? ?Filed Weights  ? 08/04/21 2018  ?Weight: 71.4 kg  ? ? ?Examination: ? ?Awake coherent black male looking much younger than stated age no icterus no pallor neck soft supple ?S1-S2 no murmur no rub no gallop-on monitors sinus rhythm ?Abdomen is soft no rebound or ?Psych is euthymic coherent pleasant ? ?Data Reviewed: personally reviewed  ? ?CBC ?   ?Component Value Date/Time  ? WBC 5.9 08/05/2021 0403  ? RBC 3.56 (L) 08/05/2021 0403  ? HGB 11.5 (L) 08/05/2021 0403  ? HCT 36.5 (L) 08/05/2021 0403  ? HCT 37.0 (L) 12/28/2020 1451  ? PLT 115 (L) 08/05/2021 0403  ? MCV 102.5 (H)  08/05/2021 0403  ? MCH 32.3 08/05/2021 0403  ? MCHC 31.5 08/05/2021 0403  ? RDW 13.0 08/05/2021 0403  ? LYMPHSABS 0.5 (L) 08/04/2021 1026  ? MONOABS 0.6 08/04/2021 1026  ? EOSABS 0.0 08/04/2021 1026  ? BASOSABS 0.0 08/04/2021 1026  ? ? ?  Latest Ref Rng & Units 08/05/2021  ?  4:03 AM 08/04/2021  ? 10:26 AM 12/28/2020  ?  2:51 PM  ?CMP  ?Glucose 70 - 99 mg/dL 097   353   96    ?BUN 8 - 23 mg/dL 31   33   18     ?Creatinine 0.61 - 1.24 mg/dL 2.99   2.42   6.83    ?Sodium 135 - 145 mmol/L 140   139   142    ?Potassium 3.5 - 5.1 mmol/L 4.0   5.1   4.5    ?Chloride 98 - 111 mmol/L 111   108   108    ?CO2 22 - 32 mmol/L 22   21   26     ?Calcium 8.9 - 10.3 mg/dL 8.3   8.8   9.1    ?Total Protein 6.5 - 8.1 g/dL 5.0   6.1   6.5    ?Total Bilirubin 0.3 - 1.2 mg/dL 1.0   1.4   0.7    ?Alkaline Phos 38 - 126 U/L 52   62   70    ?AST 15 - 41 U/L 140   192   16    ?ALT 0 - 44 U/L 59   67   8    ? ? ? ?Radiology Studies: ?CT Head Wo Contrast ? ?Result Date: 08/04/2021 ?CLINICAL DATA:  Head trauma, minor (Age >= 65y) EXAM: CT HEAD WITHOUT CONTRAST TECHNIQUE: Contiguous axial images were obtained from the base of the skull through the vertex without intravenous contrast. RADIATION DOSE REDUCTION: This exam was performed according to the departmental dose-optimization program which includes automated exposure control, adjustment of the mA and/or kV according to patient size and/or use of iterative reconstruction technique. COMPARISON:  None Available. FINDINGS: Brain: There is no acute intracranial hemorrhage, mass effect, or edema. Gray-white differentiation is preserved. There is no extra-axial fluid collection. Prominence of the ventricles and sulci reflects parenchymal volume loss. Patchy hypoattenuation in the supratentorial white matter is nonspecific but may reflect mild chronic microvascular ischemic changes. Age indeterminate small vessel infarcts of the right basal ganglia and adjacent white matter and possibly the posterior right cerebellum. Vascular: No hyperdense vessel.There is atherosclerotic calcification at the skull base. Skull: Calvarium is unremarkable. Sinuses/Orbits: No acute finding. Other: Mastoid air cells are clear. IMPRESSION: No acute intracranial hemorrhage. 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 Mild chronic microvascular ischemic  changes. Electronically Signed   By: 10/04/2021 M.D.   On: 08/04/2021 11:51  ? ?CT Angio Chest PE W and/or Wo Contrast ? ?Result Date: 08/04/2021 ?CLINICAL DATA:  Unknown D-dimer, redness noted of the right hip with small abrasions. Found on the floor. EXAM: CT ANGIOGRAPHY CHEST WITH CONTRAST TECHNIQUE: Multidetector CT imaging of the chest was performed using the standard protocol during bolus administration of intravenous contrast. Multiplanar CT image reconstructions and MIPs were obtained to evaluate the vascular anatomy. RADIATION DOSE REDUCTION: This exam was performed according to the departmental dose-optimization program which includes automated exposure control, adjustment of the mA and/or kV according to patient size and/or use of iterative reconstruction technique. CONTRAST:  75mL OMNIPAQUE  IOHEXOL 350 MG/ML SOLN COMPARISON:  None Available. FINDINGS: Cardiovascular: Satisfactory opacification of the pulmonary arteries to the segmental level. No evidence of pulmonary embolism. Normal heart size. No pericardial effusion. Thoracic aortic atherosclerosis. Mediastinum/Nodes: No enlarged mediastinal, hilar, or axillary lymph nodes. Trachea and esophagus demonstrate no significant findings. 3 cm ill-defined hypodense left thyroid mass of indeterminate etiology. In the setting of significant comorbidities or limited life expectancy, no follow-up recommended (ref: J Am Coll Radiol. 2015 Feb;12(2): 143-50). Lungs/Pleura: Small right pleural effusion. No left pleural effusion. No focal consolidation. No pneumothorax. No pulmonary mass. Mild bilateral lower lobe bronchial thickening. Upper Abdomen: No acute abnormality.  4.4 cm left hepatic cyst. Musculoskeletal: No acute osseous abnormality. No aggressive osseous lesion. Bilateral sternoclavicular joint arthropathy. Review of the MIP images confirms the above findings. IMPRESSION: 1. No pulmonary embolism. 2. Small right pleural effusion. 3. Aortic  Atherosclerosis (ICD10-I70.0). Electronically Signed   By: Elige Ko M.D.   On: 08/04/2021 11:57  ? ?CT Cervical Spine Wo Contrast ? ?Result Date: 08/04/2021 ?CLINICAL DATA:  Neck trauma (Age >= 65y) EXAM: CT CERVI

## 2021-08-05 NOTE — Plan of Care (Signed)
  Problem: Nutrition: Goal: Adequate nutrition will be maintained Outcome: Progressing   Problem: Elimination: Goal: Will not experience complications related to bowel motility Outcome: Progressing Goal: Will not experience complications related to urinary retention Outcome: Progressing   

## 2021-08-05 NOTE — TOC Progression Note (Signed)
Transition of Care (TOC) - Progression Note  ? ? ?Patient Details  ?Name: John Rose ?MRN: [id number] ?Date of Birth: 07/27/1922 ? ?Transition of Care (TOC) CM/SW Contact  ?Geni Bers, RN ?Phone Number: ?08/05/2021, 2:26 PM ? ?Clinical Narrative:    ?Pt from home alone, TOC will continue to follow for discharge needs.  ? ? ?Expected Discharge Plan: Home w Home Health Services ?Barriers to Discharge: No Barriers Identified ? ?Expected Discharge Plan and Services ?Expected Discharge Plan: Home w Home Health Services ?  ?  ?Post Acute Care Choice: Home Health, Skilled Nursing Facility ?Living arrangements for the past 2 months: Single Family Home ?                ?  ?  ?  ?  ?  ?  ?  ?  ?  ?  ? ? ?Social Determinants of Health (SDOH) Interventions ?  ? ?Readmission Risk Interventions ?   ? View : No data to display.  ?  ?  ?  ? ? ?

## 2021-08-06 DIAGNOSIS — G928 Other toxic encephalopathy: Secondary | ICD-10-CM | POA: Diagnosis not present

## 2021-08-06 DIAGNOSIS — N1832 Chronic kidney disease, stage 3b: Secondary | ICD-10-CM | POA: Diagnosis not present

## 2021-08-06 DIAGNOSIS — J90 Pleural effusion, not elsewhere classified: Secondary | ICD-10-CM | POA: Diagnosis not present

## 2021-08-06 DIAGNOSIS — M6282 Rhabdomyolysis: Secondary | ICD-10-CM | POA: Diagnosis not present

## 2021-08-06 DIAGNOSIS — D72819 Decreased white blood cell count, unspecified: Secondary | ICD-10-CM | POA: Diagnosis not present

## 2021-08-06 DIAGNOSIS — N179 Acute kidney failure, unspecified: Secondary | ICD-10-CM | POA: Diagnosis not present

## 2021-08-06 DIAGNOSIS — T796XXA Traumatic ischemia of muscle, initial encounter: Secondary | ICD-10-CM | POA: Diagnosis not present

## 2021-08-06 DIAGNOSIS — D696 Thrombocytopenia, unspecified: Secondary | ICD-10-CM | POA: Diagnosis not present

## 2021-08-06 DIAGNOSIS — L89890 Pressure ulcer of other site, unstageable: Secondary | ICD-10-CM | POA: Diagnosis not present

## 2021-08-06 DIAGNOSIS — D631 Anemia in chronic kidney disease: Secondary | ICD-10-CM | POA: Diagnosis not present

## 2021-08-06 LAB — CBC
HCT: 29.7 % — ABNORMAL LOW (ref 39.0–52.0)
Hemoglobin: 9.9 g/dL — ABNORMAL LOW (ref 13.0–17.0)
MCH: 32.9 pg (ref 26.0–34.0)
MCHC: 33.3 g/dL (ref 30.0–36.0)
MCV: 98.7 fL (ref 80.0–100.0)
Platelets: 99 10*3/uL — ABNORMAL LOW (ref 150–400)
RBC: 3.01 MIL/uL — ABNORMAL LOW (ref 4.22–5.81)
RDW: 13.2 % (ref 11.5–15.5)
WBC: 4.1 10*3/uL (ref 4.0–10.5)
nRBC: 0 % (ref 0.0–0.2)

## 2021-08-06 LAB — COMPREHENSIVE METABOLIC PANEL
ALT: 52 U/L — ABNORMAL HIGH (ref 0–44)
AST: 103 U/L — ABNORMAL HIGH (ref 15–41)
Albumin: 2.7 g/dL — ABNORMAL LOW (ref 3.5–5.0)
Alkaline Phosphatase: 44 U/L (ref 38–126)
Anion gap: 6 (ref 5–15)
BUN: 26 mg/dL — ABNORMAL HIGH (ref 8–23)
CO2: 25 mmol/L (ref 22–32)
Calcium: 8.1 mg/dL — ABNORMAL LOW (ref 8.9–10.3)
Chloride: 109 mmol/L (ref 98–111)
Creatinine, Ser: 1.05 mg/dL (ref 0.61–1.24)
GFR, Estimated: >60 mL/min (ref >60)
Glucose, Bld: 117 mg/dL — ABNORMAL HIGH (ref 70–99)
Potassium: 3.9 mmol/L (ref 3.5–5.1)
Sodium: 140 mmol/L (ref 135–145)
Total Bilirubin: 0.8 mg/dL (ref 0.3–1.2)
Total Protein: 4.8 g/dL — ABNORMAL LOW (ref 6.5–8.1)

## 2021-08-06 LAB — CK: Total CK: 1117 U/L — ABNORMAL HIGH (ref 49–397)

## 2021-08-06 LAB — PHOSPHORUS: Phosphorus: 2.3 mg/dL — ABNORMAL LOW (ref 2.5–4.6)

## 2021-08-06 MED ORDER — BOOST / RESOURCE BREEZE PO LIQD CUSTOM
1.0000 | ORAL | Status: DC
  Administered 2021-08-07 – 2021-08-09 (×3): 1 via ORAL

## 2021-08-06 MED ORDER — ENSURE ENLIVE PO LIQD
237.0000 mL | ORAL | Status: DC
  Administered 2021-08-06 – 2021-08-08 (×3): 237 mL via ORAL

## 2021-08-06 MED ORDER — ADULT MULTIVITAMIN W/MINERALS CH
1.0000 | ORAL_TABLET | Freq: Every day | ORAL | Status: DC
  Administered 2021-08-06 – 2021-08-09 (×4): 1 via ORAL
  Filled 2021-08-06 (×4): qty 1

## 2021-08-06 NOTE — Progress Notes (Signed)
?PROGRESS NOTE ? ? ?John Rose  [id number] DOB: [birth year] DOA: 08/04/2021 ?PCP: John Gravel, MD  ?Brief Narrative:  ?42 black male comm dwell male  ?Known Testosterone def, BPH, Multinod Goitre, CKD 3B baseline, vit B12--follows with Dr. Irene Rose of oncology for MDS and was last seen by him 12/28/2020-(DDx at the time B12 deficiency), lumbar stenosis open-using back brace and was previously followed by Dr. Jannifer Rose of neurology , prior internal hemorrhoids back in 2009 ? ?Patient found on floor at home-neighbor checked on him twice-encephalopathic-specific gravity increased ketones present in urine ?White count 7 ?Total CK3 3986, AST 192/ALT 67 ?Troponin 127--151---imaging lower extremities normal-CT head no intracranial hemorrhage-MRI no intracranial abnormality ?C-spine CT no spinal fracture ?CTA chest small right pleural effusion no PE ? ?Echocardiogram done, Lasix held, IVF initiated ? ?Hospital-Problem based course ? 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 ?Troponin elevation ?2/2 AKI-no chest pain at this time--echocardiogram does not reveal any wall motion anomalies ?Right-sided deep tissue injury ?Covered 2 x 2 centimeter wound on right shin with Medihoney-appreciate wound care assistance ? ?DVT prophylaxis: Lovenox ?Code Status: Discussed with patient's son at the bedside ?Family Communication:  ?Disposition:  ?Status is: Observation ?The patient will require care spanning > 2 midnights and should be moved to inpatient because:  ? ?Has rhabdo  ?PT rec  SNF ?  ?Consultants:  ? ? ?Procedures:  ? ?Antimicrobials:   ? ? ?Subjective: ? ?Sleepy today  ?A bit confused but orientable some ?Eating fair ? ? ?Objective: ?Vitals:  ? 08/05/21 0738 08/05/21 2217 08/06/21 0541 08/06/21 1231  ?BP: (!) 146/78 135/85 126/76 (!) 137/91  ?Pulse: 91 87 78 76  ?Resp: 16 (!) 21 20 14   ?Temp: 98.2 ?F (36.8 ?C) 98.4 ?F (36.9 ?C) 98.4 ?F (36.9 ?C) 98.1 ?F (36.7 ?C)  ?TempSrc:  Oral Oral Oral  ?SpO2: 98% 100% 100% 100%  ?Weight:      ?Height:      ? ? ?Intake/Output Summary (Last 24 hours) at 08/06/2021 1602 ?Last data filed at 08/06/2021 1500 ?Gross per 24 hour  ?Intake 2148.36 ml  ?Output 1250 ml  ?Net 898.36 ml  ? ? ?Filed Weights  ? 08/04/21 2018  ?Weight: 71.4 kg  ? ? ?Examination: ? ?coherent black male looking much younger than stated age no icterus ?S1-S2 no murmur no rub no gallop-on monitors sinus rhythm ?Abdomen is soft no rebound or ?Psych is euthymic coherent pleasant ?LE's not examined ? ?Data Reviewed: personally reviewed  ? ?CBC ?   ?Component Value Date/Time  ? WBC 4.1 08/06/2021 0400  ? RBC 3.01 (L) 08/06/2021 0400  ? HGB 9.9 (L) 08/06/2021 0400  ? HCT 29.7 (L) 08/06/2021 0400  ? HCT 37.0 (L) 12/28/2020 1451  ? PLT 99 (L) 08/06/2021 0400  ? MCV 98.7 08/06/2021 0400  ? MCH 32.9 08/06/2021 0400  ? MCHC 33.3 08/06/2021 0400  ? RDW 13.2 08/06/2021 0400  ? LYMPHSABS 0.5 (L) 08/04/2021 1026  ? MONOABS 0.6 08/04/2021 1026  ? EOSABS 0.0 08/04/2021 1026  ? BASOSABS 0.0 08/04/2021 1026  ? ? ?  Latest Ref Rng & Units 08/06/2021  ?  4:00 AM  08/05/2021  ?  4:03 AM 08/04/2021  ? 10:26 AM  ?CMP  ?Glucose 70 - 99 mg/dL 117   129   100    ?BUN 8 - 23 mg/dL 26   31   33    ?Creatinine 0.61 - 1.24 mg/dL 1.05   1.18   1.21    ?Sodium 135 - 145 mmol/L 140   140   139    ?Potassium 3.5 - 5.1 mmol/L 3.9   4.0   5.1    ?Chloride 98 - 111 mmol/L 109   111   108    ?CO2 22 - 32 mmol/L 25   22   21     ?Calcium 8.9 - 10.3 mg/dL 8.1   8.3   8.8    ?Total Protein 6.5 - 8.1 g/dL 4.8   5.0   6.1    ?Total  Bilirubin 0.3 - 1.2 mg/dL 0.8   1.0   1.4    ?Alkaline Phos 38 - 126 U/L 44   52   62    ?AST 15 - 41 U/L 103   140   192    ?ALT 0 - 44 U/L 52   59   67    ? ? ? ?Radiology Studies: ?ECHOCARDIOGRAM COMPLETE ? ?Result Date: 08/05/2021 ?   ECHOCARDIOGRAM REPORT   Patient Name:   John Rose Date of Exam: 08/05/2021 Medical Rec #:  [id number]      Height:       68.0 in Accession #:    [id number]     Weight:       157.4 lb Date of Birth:  [birth year]       BSA:          1.846 m? Patient Age:    87 years       BP:           138/75 mmHg Patient Gender: M              HR:           90 bpm. Exam Location:  Inpatient Procedure: 2D Echo, Cardiac Doppler and Color Doppler Indications:    Elevated troponin  History:        Patient has no prior history of Echocardiogram examinations.  Sonographer:    Jyl Heinz Referring Phys: John Rose John Rose  Sonographer Comments: Suboptimal parasternal window. IMPRESSIONS  1. Left ventricular ejection fraction, by estimation, is 60 to 65%. The left ventricle has normal function. The left ventricle has no regional wall motion abnormalities. There is severe left ventricular hypertrophy. Left ventricular diastolic parameters  are consistent with Grade II diastolic dysfunction (pseudonormalization).  2. Right ventricular systolic function is normal. The right ventricular size is normal. There is mildly elevated pulmonary artery systolic pressure.  3. Left atrial size was moderately dilated.  4. Right atrial size was moderately dilated.  5. The mitral valve is grossly normal. No evidence of mitral valve regurgitation.  6. The aortic valve is calcified. Aortic valve regurgitation is not visualized. Aortic valve sclerosis/calcification is present, without any evidence of aortic stenosis.  7. The inferior vena cava is normal in size with greater than 50% respiratory variability, suggesting right atrial pressure of 3 mmHg. Comparison(s): No prior Echocardiogram. FINDINGS  Left Ventricle: Left  ventricular ejection fraction, by estimation, is 60 to 65%. The left ventricle has normal function. The left ventricle has no regional wall motion abnormalities. The left ventricular internal cavity size was normal in  size. There is  severe left ventricular hypertrophy. Left ventricular diastolic parameters are consistent with Grade II diastolic dysfunction (pseudonormalization). Right Ventricle: The right ventricular size is normal. No increase in right ventricular wall thickness. Right ventricular systolic function is normal. There is mildly elevated pulmonary artery systolic pressure. The tricuspid regurgitant velocity is 2.92  m/s, and with an assumed right atrial pressure of 3 mmHg, the estimated right ventricular systolic pressure is [id number] mmHg. Left Atrium: Left atrial size was moderately dilated. Right Atrium: Right atrial size was moderately dilated. Pericardium: Trivial pericardial effusion is present. Mitral Valve: The mitral valve is grossly normal. Mild mitral annular calcification. No evidence of mitral valve regurgitation. Tricuspid Valve: The tricuspid valve is normal in structure. Tricuspid valve regurgitation is mild. Aortic Valve: The aortic valve is calcified. Aortic valve regurgitation is not visualized. Aortic valve sclerosis/calcification is present, without any evidence of aortic stenosis. Aortic valve peak gradient measures 9.0 mmHg. Pulmonic Valve: The pulmonic valve was normal in structure. Pulmonic valve regurgitation is not visualized. Aorta: The aortic root is normal in size and structure. Venous: The inferior vena cava is normal in size with greater than 50% respiratory variability, suggesting right atrial pressure of 3 mmHg. IAS/Shunts: No atrial level shunt detected by color flow Doppler.  LEFT VENTRICLE PLAX 2D LVIDd:         3.80 cm     Diastology LVIDs:         2.50 cm     LV e' medial:    5.17 cm/s LV PW:         1.40 cm     LV E/e' medial:  17.1 LV IVS:        1.00 cm     LV e'  lateral:   5.08 cm/s LVOT diam:     2.00 cm     LV E/e' lateral: 17.4 LV SV:         55 LV SV Index:   30 LVOT Area:     3.14 cm?  LV Volumes (MOD) LV vol d, MOD A2C: 88.0 ml LV vol d, MOD A4C: 65.5 ml LV vol s

## 2021-08-06 NOTE — Evaluation (Signed)
Physical Therapy Evaluation ?Patient Details ?Name: John Rose ?MRN: [id number] ?DOB: 1922-10-21 ?Today's Date: 08/06/2021 ? ?History of Present Illness ? John Rose is a 88 y.o. male who lives alone at home with medical history significant of lumbar spondylosis, testosterone deficiency who was found on the floor for an unknown period of time.  ?Clinical Impression ? Pt admitted with above diagnosis.  Pt currently with functional limitations due to the deficits listed below (see PT Problem List). Pt will benefit from skilled PT to increase their independence and safety with mobility to allow discharge to the venue listed below.  Pt is not safe to return alone at this time and recommend SNF.  He was pleasant and cooperative.  ?   ?   ? ?Recommendations for follow up therapy are one component of a multi-disciplinary discharge planning process, led by the attending physician.  Recommendations may be updated based on patient status, additional functional criteria and insurance authorization. ? ?Follow Up Recommendations Skilled nursing-short term rehab (<3 hours/day) ? ?  ?Assistance Recommended at Discharge Frequent or constant Supervision/Assistance  ?Patient can return home with the following ? A little help with walking and/or transfers;Assist for transportation ? ?  ?Equipment Recommendations None recommended by PT  ?Recommendations for Other Services ?    ?  ?Functional Status Assessment Patient has had a recent decline in their functional status and demonstrates the ability to make significant improvements in function in a reasonable and predictable amount of time.  ? ?  ?Precautions / Restrictions Precautions ?Precautions: Fall ?Restrictions ?Weight Bearing Restrictions: No  ? ?  ? ?Mobility ? Bed Mobility ?Overal bed mobility: Needs Assistance ?Bed Mobility: Supine to Sit, Sit to Supine ?  ?  ?Supine to sit: Max assist ?Sit to supine: Max assist ?  ?General bed mobility comments: MAX of 1 to sit EOB,  primarily for legs, pt able to give a good effort with upper body, but needed some A with trunk. ?  ? ?Transfers ?Overall transfer level: Needs assistance ?Equipment used: Rolling walker (2 wheels) ?Transfers: Sit to/from Stand ?Sit to Stand: Total assist ?  ?  ?  ?  ?  ?General transfer comment: Pt able to lift buttocks off the bed just a bit, but then had to sit. ?  ? ?Ambulation/Gait ?  ?  ?  ?  ?  ?  ?  ?General Gait Details: NT ? ?Stairs ?  ?  ?  ?  ?  ? ?Wheelchair Mobility ?  ? ?Modified Rankin (Stroke Patients Only) ?  ? ?  ? ?Balance Overall balance assessment: History of Falls ?  ?  ?  ?  ?  ?  ?  ?  ?  ?  ?  ?  ?  ?  ?  ?  ?  ?  ?   ? ? ? ?Pertinent Vitals/Pain Pain Assessment ?Pain Assessment: Faces ?Faces Pain Scale: Hurts even more ?Pain Location: hips with supine > sit ?Pain Descriptors / Indicators: Other (Comment) (stiffness) ?Pain Intervention(s): Monitored during session, Limited activity within patient's tolerance, Repositioned  ? ? ?Home Living   ?Living Arrangements: Alone ?  ?  ?  ?  ?  ?  ?  ?  ?   ?  ?Prior Function Prior Level of Function : Patient poor historian/Family not available ?  ?  ?  ?  ?  ?  ?Mobility Comments: He couldn't state if he walked with a cane or RW, but did use something ?ADLs  Comments: Per chart he was driving. ?  ? ? ?Hand Dominance  ?   ? ?  ?Extremity/Trunk Assessment  ?   ?  ? ?Lower Extremity Assessment ?Lower Extremity Assessment: Generalized weakness;RLE deficits/detail;LLE deficits/detail ?RLE Deficits / Details: stiffness and sensitive to touch ?LLE Deficits / Details: stiffness and sensitive to touch ?  ? ?   ?Communication  ? Communication: HOH  ?Cognition Arousal/Alertness: Awake/alert ?  ?Overall Cognitive Status: Impaired/Different from baseline ?Area of Impairment: Following commands, Safety/judgement, Problem solving ?  ?  ?  ?  ?  ?  ?  ?  ?  ?  ?  ?Following Commands: Follows one step commands inconsistently ?Safety/Judgement: Decreased awareness of  deficits ?  ?Problem Solving: Slow processing ?General Comments: Knew he was in the hospital due to a fall.  [place].  Had difficulty with questions regarding PLOF. ?  ?  ? ?  ?General Comments General comments (skin integrity, edema, etc.): Heel protectors on ? ?  ?Exercises    ? ?Assessment/Plan  ?  ?PT Assessment Patient needs continued PT services  ?PT Problem List Decreased strength;Decreased activity tolerance;Decreased balance;Decreased mobility;Decreased safety awareness;Decreased cognition ? ?   ?  ?PT Treatment Interventions Gait training;Functional mobility training;Balance training;Therapeutic exercise;Therapeutic activities   ? ?PT Goals (Current goals can be found in the Care Plan section)  ?Acute Rehab PT Goals ?Patient Stated Goal: Pt agreeable to work with PT ?PT Goal Formulation: With patient ?Time For Goal Achievement: 08/20/21 ?Potential to Achieve Goals: Good ? ?  ?Frequency Min 2X/week ?  ? ? ?Co-evaluation   ?  ?  ?  ?  ? ? ?  ?AM-PAC PT "6 Clicks" Mobility  ?Outcome Measure Help needed turning from your back to your side while in a flat bed without using bedrails?: A Lot ?Help needed moving from lying on your back to sitting on the side of a flat bed without using bedrails?: A Lot ?Help needed moving to and from a bed to a chair (including a wheelchair)?: Total ?Help needed standing up from a chair using your arms (e.g., wheelchair or bedside chair)?: Total ?Help needed to walk in hospital room?: Total ?Help needed climbing 3-5 steps with a railing? : Total ?6 Click Score: 8 ? ?  ?End of Session Equipment Utilized During Treatment: Gait belt ?Activity Tolerance: Patient limited by fatigue ?Patient left: in bed;with call bell/phone within reach;with bed alarm set ?Nurse Communication: Mobility status ?PT Visit Diagnosis: Muscle weakness (generalized) (M62.81);Unsteadiness on feet (R26.81);Other abnormalities of gait and  mobility (R26.89) ?  ? ?Time: 8099-8338 ?PT Time Calculation (min) (ACUTE ONLY): 33 min ? ? ?Charges:   PT Evaluation ?$PT Eval Moderate Complexity: 1 Mod ?PT Treatments ?$Therapeutic Activity: 8-22 mins ?  ?   ? ? ?Clydie Braun L. Katrinka Blazing, PT ? ?08/06/2021 ? ? ?Enzo Montgomery ?08/06/2021, 3:48 PM ? ?

## 2021-08-06 NOTE — Progress Notes (Signed)
Initial Nutrition Assessment ?RD working remotely. ? ? ?DOCUMENTATION CODES:  ? ?Not applicable ? ?INTERVENTION:  ?- will order Boost Breeze once/day, each supplement provides 250 kcal and 9 grams of protein. ?- will order Ensure Plus High Protein once/day, each supplement provides 350 kcal and 20 grams of protein. ?- will order 1 tablet multivitamin with minerals/day. ?- complete NFPE when feasible.  ? ? ?NUTRITION DIAGNOSIS:  ? ?Inadequate oral intake related to decreased appetite as evidenced by per patient/family report. ? ?GOAL:  ? ?Patient will meet greater than or equal to 90% of their needs ? ?MONITOR:  ? ?PO intake, Supplement acceptance, Labs, Weight trends ? ?REASON FOR ASSESSMENT:  ? ?Malnutrition Screening Tool ? ?ASSESSMENT:  ? ?88 y.o. male who lives alone at home with medical history of lumbar spondylosis and testosterone deficiency. He presented to the ED after being found down on the floor for an unknown amount of time. He reported fair appetite without N/V/D or constipation. ? ?He is noted to be a/o to person and place. He has been eating 75-100% at meals.  ? ?Patient has not been seen by a  RD at any time in the past. ? ?Weight on 5/4 was 157 lb and PTA the most recently documented weight was 151 lb on 12/28/20. Mild pitting edema to BLE documented in the edema section of flow sheet.  ? ?MD note from yesterday outlines concern for vitamin B12 deficiency and plan for outpatient testing.  ? ? ?Labs reviewed; BUN: 26 mg/dl, Ca: 8.1 mg/dl, Phos: 2.3 mg/dl. ?Medications reviewed. ?IVF; LR @ 50 ml/hr.  ?  ? ?NUTRITION - FOCUSED PHYSICAL EXAM: ? ?RD working remotely. ? ?Diet Order:   ?Diet Order   ? ?       ?  Diet Heart Room service appropriate? Yes; Fluid consistency: Thin  Diet effective now       ?  ? ?  ?  ? ?  ? ? ?EDUCATION NEEDS:  ? ?No education needs have been identified at this time ? ?Skin:  Skin Assessment: Skin Integrity Issues: ?Skin Integrity Issues:: Incisions ?Incisions:  non-pressure injury to R pretibia ? ?Last BM:  PTA/unknown ? ?Height:  ? ?Ht Readings from Last 1 Encounters:  ?08/04/21 5\' 8"  (1.727 m)  ? ? ?Weight:  ? ?Wt Readings from Last 1 Encounters:  ?08/04/21 71.4 kg  ? ? ? ?BMI:  Body mass index is 23.93 kg/m?. ? ?Estimated Nutritional Needs:  ?Kcal:  1550-1750 kcal ?Protein:  70-85 grams ?Fluid:  >/= 1.6 L/day ? ? ? ? ?10/04/21, MS, RD, LDN ?Registered Dietitian II ?Inpatient Clinical Nutrition ?RD pager # and on-call/weekend pager # available in AMION  ? ?

## 2021-08-07 DIAGNOSIS — G928 Other toxic encephalopathy: Secondary | ICD-10-CM | POA: Diagnosis not present

## 2021-08-07 DIAGNOSIS — T796XXA Traumatic ischemia of muscle, initial encounter: Secondary | ICD-10-CM | POA: Diagnosis not present

## 2021-08-07 DIAGNOSIS — D696 Thrombocytopenia, unspecified: Secondary | ICD-10-CM | POA: Diagnosis not present

## 2021-08-07 DIAGNOSIS — N1832 Chronic kidney disease, stage 3b: Secondary | ICD-10-CM | POA: Diagnosis not present

## 2021-08-07 DIAGNOSIS — J90 Pleural effusion, not elsewhere classified: Secondary | ICD-10-CM | POA: Diagnosis not present

## 2021-08-07 DIAGNOSIS — M6282 Rhabdomyolysis: Secondary | ICD-10-CM | POA: Diagnosis not present

## 2021-08-07 DIAGNOSIS — N179 Acute kidney failure, unspecified: Secondary | ICD-10-CM | POA: Diagnosis not present

## 2021-08-07 DIAGNOSIS — D631 Anemia in chronic kidney disease: Secondary | ICD-10-CM | POA: Diagnosis not present

## 2021-08-07 DIAGNOSIS — D72819 Decreased white blood cell count, unspecified: Secondary | ICD-10-CM | POA: Diagnosis not present

## 2021-08-07 DIAGNOSIS — L89890 Pressure ulcer of other site, unstageable: Secondary | ICD-10-CM | POA: Diagnosis not present

## 2021-08-07 LAB — BASIC METABOLIC PANEL
Anion gap: 6 (ref 5–15)
BUN: 21 mg/dL (ref 8–23)
CO2: 26 mmol/L (ref 22–32)
Calcium: 8.1 mg/dL — ABNORMAL LOW (ref 8.9–10.3)
Chloride: 105 mmol/L (ref 98–111)
Creatinine, Ser: 1.01 mg/dL (ref 0.61–1.24)
GFR, Estimated: >60 mL/min (ref >60)
Glucose, Bld: 107 mg/dL — ABNORMAL HIGH (ref 70–99)
Potassium: 3.8 mmol/L (ref 3.5–5.1)
Sodium: 137 mmol/L (ref 135–145)

## 2021-08-07 LAB — CK: Total CK: 632 U/L — ABNORMAL HIGH (ref 49–397)

## 2021-08-07 MED ORDER — AMMONIUM LACTATE 12 % EX LOTN
TOPICAL_LOTION | CUTANEOUS | Status: DC | PRN
  Filled 2021-08-07: qty 225

## 2021-08-07 NOTE — Progress Notes (Addendum)
?PROGRESS NOTE ? ? 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 Anne Hahn of neurology , prior internal hemorrhoids back in 2009 ? ?Patient found on floor at home-neighbor checked on him twice-encephalopathic-specific gravity increased ketones present in urine ?White count 7 ?Total CK3 3986, AST 192/ALT 67 ?Troponin 127--151---imaging lower extremities normal-CT head no intracranial hemorrhage-MRI no intracranial abnormality ?C-spine CT no spinal fracture ?CTA chest small right pleural effusion no PE ? ?Echocardiogram done, Lasix held, IVF initiated ? ?Hospital-Problem based course ? 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 ?Troponin elevation ?2/2 AKI-no chest pain at this time--echocardiogram does not reveal any wall motion anomalies ?Right-sided deep tissue injury ?Covered 2 x 2 centimeter wound on right shin with Medihoney-appreciate wound care assistance ? ?DVT prophylaxis: Lovenox ?Code Status: called [place], son on phone [phone], left VM detailing likely can d/c 48 h ?Family Communication:  ?Disposition:  ?Status is: Observation ?The patient will  require care spanning > 2 midnights and should be moved to inpatient because:  ? ?Has rhabdo  ?PT rec SNF ?  ?Consultants:  ? ? ?Procedures:  ? ?Antimicrobials:   ? ? ?Subjective: ? ?Less confused ?Asks me to talk with son ?Hasn't been OOBN today ?Lights left on--patient encouraged to sit up and eat when son comes by ? ? ?Objective: ?Vitals:  ? 08/06/21 0541 08/06/21 1231 08/06/21 2102 08/07/21 0315  ?BP: 126/76 (!) 137/91 127/68 107/65  ?Pulse: 78 76 74 73  ?Resp: 20 14 16 14   ?Temp: 98.4 ?F (36.9 ?C) 98.1 ?F (36.7 ?C) 98 ?F (36.7 ?C) 98.8 ?F (37.1 ?C)  ?TempSrc: Oral Oral Oral Oral  ?SpO2: 100% 100% 96% 97%  ?Weight:      ?Height:      ? ? ?Intake/Output Summary (Last 24 hours) at 08/07/2021 1001 ?Last data filed at 08/07/2021 0600 ?Gross per 24 hour  ?Intake 1594.55 ml  ?Output 450 ml  ?Net 1144.55 ml  ? ? ?Filed Weights  ? 08/04/21 2018  ?Weight: 71.4 kg  ? ? ?Examination: ? ?coherent black male, jovial ? looking much younger than stated age no icterus ?S1-S2 no murmur no rub no gallop-on monitors sinus rhythm ?Abdomen is soft no rebound or ?Psych is euthymic coherent pleasant ?LE bilaterally soft, slight tender to touch--dry scale to both LE's ? ?Data Reviewed: personally reviewed  ? ?CBC ?   ?Component Value Date/Time  ? WBC 4.1 08/06/2021 0400  ? RBC 3.01 (L) 08/06/2021 0400  ? HGB 9.9 (L) 08/06/2021 0400  ? HCT 29.7 (L) 08/06/2021 0400  ? HCT 37.0 (L) 12/28/2020 1451  ? PLT 99 (L) 08/06/2021 0400  ? MCV 98.7 08/06/2021 0400  ? MCH 32.9 08/06/2021 0400  ? MCHC 33.3 08/06/2021 0400  ? RDW  13.2 08/06/2021 0400  ? LYMPHSABS 0.5 (L) 08/04/2021 1026  ? MONOABS 0.6 08/04/2021 1026  ? EOSABS 0.0 08/04/2021 1026  ? BASOSABS 0.0 08/04/2021 1026  ? ? ?  Latest Ref Rng & Units 08/07/2021  ?  3:59 AM 08/06/2021  ?  4:00 AM 08/05/2021  ?  4:03 AM  ?CMP  ?Glucose 70 - 99 mg/dL 086   761   950    ?BUN 8 - 23 mg/dL 21   26   31     ?Creatinine 0.61 - 1.24 mg/dL   9.32   6.71    ?Sodium 135 - 145 mmol/L 137   140   140     ?Potassium 3.5 - 5.1 mmol/L 3.8   3.9   4.0    ?Chloride 98 - 111 mmol/L 105   109   111    ?CO2 22 - 32 mmol/L 26   25   22     ?Calcium 8.9 - 10.3 mg/dL 8.1   8.1   8.3    ?Total Protein 6.5 - 8.1 g/dL  4.8   5.0    ?Total Bilirubin 0.3 - 1.2 mg/dL  0.8   1.0    ?Alkaline Phos 38 - 126 U/L  44   52    ?AST 15 - 41 U/L  103   140    ?ALT 0 - 44 U/L  52   59    ? ? ? ?Radiology Studies: ?No results found. ? ? ?Scheduled Meds: ? enoxaparin (LOVENOX) injection  40 mg Subcutaneous Q24H  ? feeding supplement  1 Container Oral Q24H  ? feeding supplement  237 mL Oral Q24H  ? leptospermum manuka honey  1 application. Topical Daily  ? multivitamin with minerals  1 tablet Oral Daily  ? ?Continuous Infusions: ? ? ? ? LOS: 2 days  ? ?Time spent: 25 ? ?2.45, MD ?[facility] ?To contact the attending provider between 7A-7P or the covering provider during after hours 7P-7A, please log into the web site [email or web address] and access using universal [place] password for that web site. If you do not have the password, please call the hospital operator. ? ?08/07/2021, 10:01 AM  ? ? ?

## 2021-08-07 NOTE — NC FL2 (Signed)
?[place] MEDICAID FL2 LEVEL OF CARE SCREENING TOOL  ?  ? ?IDENTIFICATION  ?Patient Name: ?John Rose Birthdate: Aug 10, 1922 Sex: male Admission Date (Current Location): ?08/04/2021  ?[place] and [place] Number: ? [place] ?  Facility and Address:  ?[facility],  [address] [place], [place] ?     Provider Number: ?[id number]  ?Attending Physician Name and Address:  ?Nita Sells, MD ? Relative Name and Phone Number:  ?Kestin Conatser  (Son)   [phone] ?   ?Current Level of Care: ?Hospital Recommended Level of Care: ?[place] Prior Approval Number: ?  ? ?Date Approved/Denied: ?  PASRR Number: ?[id number] A ? ?Discharge Plan: ?SNF ?  ? ?Current Diagnoses: ?Patient Active Problem List  ? Diagnosis Date Noted  ? Rhabdomyolysis 08/04/2021  ? Elevated troponin 08/04/2021  ? Abnormal LFTs 08/04/2021  ? Thrombocytopenia ([place]) 08/04/2021  ? Aortic atherosclerosis ([place]) 08/04/2021  ? Pleural effusion 08/04/2021  ? Pain of left hip joint 04/13/2021  ? Lumbar spondylosis 11/10/2016  ? ? ?Orientation RESPIRATION BLADDER Height & Weight   ?  ?Self, Situation, Place ? Normal External catheter Weight: 71.4 kg ?Height:  5\' 8"  (172.7 cm)  ?BEHAVIORAL SYMPTOMS/MOOD NEUROLOGICAL BOWEL NUTRITION STATUS  ?    Incontinent Diet (Heart healthy)  ?AMBULATORY STATUS COMMUNICATION OF NEEDS Skin   ?Extensive Assist Verbally Skin abrasions ?  ?  ?  ?    ?     ?     ? ? ?Personal Care Assistance Level of Assistance  ?Bathing, Feeding, Dressing Bathing Assistance: Limited assistance ?Feeding assistance: Limited assistance ?Dressing Assistance: Limited assistance ?   ? ?Functional Limitations Info  ?Sight, Hearing, Speech Sight Info: Adequate ?Hearing Info: Adequate ?Speech Info: Adequate  ? ? ?SPECIAL CARE FACTORS FREQUENCY  ?PT (By licensed PT), OT (By licensed OT)   ?  ?PT Frequency: 5x/week ?OT Frequency: 5x/week ?  ?  ?  ?   ? ? ?Contractures Contractures Info: Not present  ? ? ?Additional  Factors Info  ?Code Status, Allergies Code Status Info: Full ?Allergies Info: No known allergies ?  ?  ?  ?   ? ?Current Medications (08/07/2021):  This is the current hospital active medication list ?Current Facility-Administered Medications  ?Medication Dose Route Frequency Provider Last Rate Last Admin  ? acetaminophen (TYLENOL) tablet 650 mg  650 mg Oral Q6H PRN Reubin Milan, MD   650 mg at 08/06/21 0746  ? Or  ? acetaminophen (TYLENOL) suppository 650 mg  650 mg Rectal Q6H PRN Reubin Milan, MD      ? ammonium lactate (LAC-HYDRIN) 12 % lotion   Topical PRN Nita Sells, MD      ? enoxaparin (LOVENOX) injection 40 mg  40 mg Subcutaneous Q24H Reubin Milan, MD   40 mg at 08/06/21 2057  ? feeding supplement (BOOST / RESOURCE BREEZE) liquid 1 Container  1 Container Oral Q24H Nita Sells, MD   1 Container at 08/07/21 [phone]  ? feeding supplement (ENSURE ENLIVE / ENSURE PLUS) liquid 237 mL  237 mL Oral Q24H Nita Sells, MD   237 mL at 08/06/21 1721  ? leptospermum manuka honey (MEDIHONEY) paste 1 application.  1 application. Topical Daily Nita Sells, MD   1 application. at 08/07/21 [id number]  ? multivitamin with minerals tablet 1 tablet  1 tablet Oral Daily Nita Sells, MD   1 tablet at 08/07/21 0935  ? ondansetron (ZOFRAN) tablet 4 mg  4 mg Oral Q6H PRN Reubin Milan, MD      ?  Or  ? ondansetron (ZOFRAN) injection 4 mg  4 mg Intravenous Q6H PRN Reubin Milan, MD      ? ? ? ?Discharge Medications: ?Please see discharge summary for a list of discharge medications. ? ?Relevant Imaging Results: ? ?Relevant Lab Results: ? ? ?Additional Information ?SS# [id number]; covid-19 vaccines - none on file ? ?Tawanna Cooler, RN ? ? ? ? ?

## 2021-08-07 NOTE — TOC Progression Note (Signed)
Transition of Care (TOC) - Progression Note  ? ? ?Patient Details  ?Name: John Rose ?MRN: [id number] ?Date of Birth: [birth year] ? ?Transition of Care (TOC) CM/SW Contact  ?Cecille Po, RN ?Phone Number: ?08/07/2021, 2:53 PM ? ?Clinical Narrative:    ? ?PT recommends SNF.  CM called patient's son, John Rose, at number listed in chart.  Left voicemail message with contact info.  ? ? ?Expected Discharge Plan: Home w Home Health Services ?Barriers to Discharge: No Barriers Identified ? ?Expected Discharge Plan and Services ?Expected Discharge Plan: Home w Home Health Services ?  ?  ?Post Acute Care Choice: Home Health, Skilled Nursing Facility ?Living arrangements for the past 2 months: Single Family Home ?                ?  ?  ?

## 2021-08-08 DIAGNOSIS — Z20822 Contact with and (suspected) exposure to covid-19: Secondary | ICD-10-CM | POA: Diagnosis not present

## 2021-08-08 DIAGNOSIS — D72819 Decreased white blood cell count, unspecified: Secondary | ICD-10-CM | POA: Diagnosis not present

## 2021-08-08 DIAGNOSIS — R7989 Other specified abnormal findings of blood chemistry: Secondary | ICD-10-CM | POA: Diagnosis not present

## 2021-08-08 DIAGNOSIS — M6282 Rhabdomyolysis: Secondary | ICD-10-CM | POA: Diagnosis not present

## 2021-08-08 DIAGNOSIS — T796XXA Traumatic ischemia of muscle, initial encounter: Secondary | ICD-10-CM | POA: Diagnosis not present

## 2021-08-08 DIAGNOSIS — L89890 Pressure ulcer of other site, unstageable: Secondary | ICD-10-CM | POA: Diagnosis not present

## 2021-08-08 DIAGNOSIS — G928 Other toxic encephalopathy: Secondary | ICD-10-CM | POA: Diagnosis not present

## 2021-08-08 DIAGNOSIS — D696 Thrombocytopenia, unspecified: Secondary | ICD-10-CM | POA: Diagnosis not present

## 2021-08-08 DIAGNOSIS — N179 Acute kidney failure, unspecified: Secondary | ICD-10-CM

## 2021-08-08 DIAGNOSIS — D631 Anemia in chronic kidney disease: Secondary | ICD-10-CM | POA: Diagnosis not present

## 2021-08-08 DIAGNOSIS — J90 Pleural effusion, not elsewhere classified: Secondary | ICD-10-CM | POA: Diagnosis not present

## 2021-08-08 DIAGNOSIS — N1832 Chronic kidney disease, stage 3b: Secondary | ICD-10-CM | POA: Diagnosis not present

## 2021-08-08 DIAGNOSIS — R778 Other specified abnormalities of plasma proteins: Secondary | ICD-10-CM | POA: Diagnosis not present

## 2021-08-08 LAB — COMPREHENSIVE METABOLIC PANEL
ALT: 45 U/L — ABNORMAL HIGH (ref 0–44)
AST: 56 U/L — ABNORMAL HIGH (ref 15–41)
Albumin: 2.6 g/dL — ABNORMAL LOW (ref 3.5–5.0)
Alkaline Phosphatase: 45 U/L (ref 38–126)
Anion gap: 3 — ABNORMAL LOW (ref 5–15)
BUN: 27 mg/dL — ABNORMAL HIGH (ref 8–23)
CO2: 29 mmol/L (ref 22–32)
Calcium: 8.1 mg/dL — ABNORMAL LOW (ref 8.9–10.3)
Chloride: 106 mmol/L (ref 98–111)
Creatinine, Ser: 1.28 mg/dL — ABNORMAL HIGH (ref 0.61–1.24)
GFR, Estimated: 51 mL/min — ABNORMAL LOW (ref >60)
Glucose, Bld: 115 mg/dL — ABNORMAL HIGH (ref 70–99)
Potassium: 4.2 mmol/L (ref 3.5–5.1)
Sodium: 138 mmol/L (ref 135–145)
Total Bilirubin: 0.9 mg/dL (ref 0.3–1.2)
Total Protein: 4.8 g/dL — ABNORMAL LOW (ref 6.5–8.1)

## 2021-08-08 LAB — CK: Total CK: 395 U/L (ref 49–397)

## 2021-08-08 NOTE — Progress Notes (Signed)
?PROGRESS NOTE ? ? ? ?John Rose  [id number] DOB: June 01, 1922 DOA: 08/04/2021 ?PCP: Jani Gravel, MD  ? ?Brief Narrative:  ?88 y.o. male who lives alone at home with medical history significant of lumbar spondylosis, testosterone deficiency, BPH, CKD stage IIIb, multinodular goiter, vitamin B12 was found on floor at home by neighbor.  On presentation, he was encephalopathic with total CK of 3986, AST 492, ALT 60 with.  CT of the head was negative for any intracranial hemorrhage.  MRI of brain was negative for intracranial abnormality.  CT C-spine showed no spinal fracture.  CTA chest showed small right pleural effusion but no PE.  He was started on IV fluids. ? ?Assessment & Plan: ?  ?Acute rhabdomyolysis ?Physical deconditioning ?-Patient was found on the floor at home by neighbor.  CK3 986 on presentation.  Treated with IV fluids.  CK 395 today.  Off IV fluids. ?-PT recommends SNF placement.  Social worker following ? ?Acute toxic/metabolic encephalopathy ?-Secondary to AKI and rhabdomyolysis. ?-CT of the brain and MRI of the brain was negative for acute intracranial abnormality ?-Fall precautions.  Mental status improving.  Still slow to respond. ? ?AKI on CKD stage IIIb ?-Creatinine much improved ? ?Elevated LFTs ?-Possibly from rhabdomyolysis.  LFTs improving.  Monitor ? ?Thrombocytopenia ?-Questionable cause.  No signs of bleeding.  Monitor ? ?Anemia of chronic disease ?-Hemoglobin currently stable.  No signs of bleeding.  Monitor intermittently ? ?Troponin elevation ?-Possibly nonischemic.  Troponins did not elevate significantly.  Echo did not show any wall motion abnormalities.  No further work-up needed ? ?Right shin wound: Present on admission ?-Wound care as per wound care consult recommendations ? ?Vitamin B12 deficiency ?-Vitamin B-12 levels low in the past.  Check vitamin B12 in AM. ? ? ?DVT prophylaxis: Lovenox ?Code Status: Full ?Family Communication: None at bedside ?Disposition Plan: ?Status is:  Inpatient ?Remains inpatient appropriate because: Of need for SNF placement ? ? ? ?Consultants: None ? ?Procedures: None ? ?Antimicrobials: None ? ?Subjective: ?Patient seen and examined at bedside.  Poor historian.  Denies worsening fever, nausea, vomiting. ? ?Objective: ?Vitals:  ? 08/07/21 2115 08/07/21 2118 08/08/21 0605 08/08/21 1322  ?BP: 123/63 123/63 108/61 90/67  ?Pulse: 83 83 81 85  ?Resp: 20 20 20 18   ?Temp: 97.9 ?F (36.6 ?C) 97.9 ?F (36.6 ?C) 98 ?F (36.7 ?C) 98.4 ?F (36.9 ?C)  ?TempSrc: Oral Oral Oral Oral  ?SpO2: 100% 100% 97% 99%  ?Weight:      ?Height:      ? ? ?Intake/Output Summary (Last 24 hours) at 08/08/2021 1349 ?Last data filed at 08/08/2021 [id number] ?Gross per 24 hour  ?Intake --  ?Output 1200 ml  ?Net -1200 ml  ? ?Filed Weights  ? 08/04/21 2018  ?Weight: 71.4 kg  ? ? ?Examination: ? ?General exam: Appears calm and comfortable.  Currently on room air.  Poor historian.  Slow to respond.  Answers some questions. ?Respiratory system: Bilateral decreased breath sounds at bases with some scattered crackles ?Cardiovascular system: S1 & S2 heard, Rate controlled ?Gastrointestinal system: Abdomen is nondistended, soft and nontender. Normal bowel sounds heard. ?Extremities: No cyanosis, clubbing; trace lower extremity edema ? ?Data Reviewed: I have personally reviewed following labs and imaging studies ? ?CBC: ?Recent Labs  ?Lab 08/04/21 ?1026 08/05/21 ?0403 08/06/21 ?0400  ?WBC 7.9 5.9 4.1  ?NEUTROABS 6.8  --   --   ?HGB 13.1 11.5* 9.9*  ?HCT 39.5 36.5* 29.7*  ?MCV 98.3 102.5* 98.7  ?PLT 137* 115* 99*  ? ?  Basic Metabolic Panel: ?Recent Labs  ?Lab 08/04/21 ?1026 08/05/21 ?0403 08/06/21 ?0400 08/07/21 ?0359 08/08/21 ?[id number]  ?NA 139 140 140 137 138  ?K 5.1 4.0 3.9 3.8 4.2  ?CL 108 111 109 105 106  ?CO2 21* 22 25 26 29   ?GLUCOSE 100* 129* 117* 107* 115*  ?BUN 33* 31* 26* 21 27*  ?CREATININE 1.21 1.18 1.05 1.01 1.28*  ?CALCIUM 8.8* 8.3* 8.1* 8.1* 8.1*  ?PHOS  --   --  2.3*  --   --   ? ?GFR: ?Estimated Creatinine  Clearance: 32.7 mL/min (A) (by C-G formula based on SCr of 1.28 mg/dL (H)). ?Liver Function Tests: ?Recent Labs  ?Lab 08/04/21 ?1026 08/05/21 ?0403 08/06/21 ?0400 08/08/21 ?[id number]  ?AST 192* 140* 103* 56*  ?ALT 67* 59* 52* 45*  ?ALKPHOS 62 52 44 45  ?BILITOT 1.4* 1.0 0.8 0.9  ?PROT 6.1* 5.0* 4.8* 4.8*  ?ALBUMIN 3.5 2.9* 2.7* 2.6*  ? ?No results for input(s): LIPASE, AMYLASE in the last 168 hours. ?No results for input(s): AMMONIA in the last 168 hours. ?Coagulation Profile: ?No results for input(s): INR, PROTIME in the last 168 hours. ?Cardiac Enzymes: ?Recent Labs  ?Lab 08/04/21 ?1026 08/05/21 ?0403 08/06/21 ?0400 08/07/21 ?0359 08/08/21 ?[id number]  ?CKTOTAL 3,986* 2,260* 1,117* 632* 395  ? ?BNP (last 3 results) ?No results for input(s): PROBNP in the last 8760 hours. ?HbA1C: ?No results for input(s): HGBA1C in the last 72 hours. ?CBG: ?No results for input(s): GLUCAP in the last 168 hours. ?Lipid Profile: ?No results for input(s): CHOL, HDL, LDLCALC, TRIG, CHOLHDL, LDLDIRECT in the last 72 hours. ?Thyroid Function Tests: ?No results for input(s): TSH, T4TOTAL, FREET4, T3FREE, THYROIDAB in the last 72 hours. ?Anemia Panel: ?No results for input(s): VITAMINB12, FOLATE, FERRITIN, TIBC, IRON, RETICCTPCT in the last 72 hours. ?Sepsis Labs: ?No results for input(s): PROCALCITON, LATICACIDVEN in the last 168 hours. ? ?No results found for this or any previous visit (from the past 240 hour(s)).  ? ? ? ? ? ?Radiology Studies: ?No results found. ? ? ? ? ? ?Scheduled Meds: ? enoxaparin (LOVENOX) injection  40 mg Subcutaneous Q24H  ? feeding supplement  1 Container Oral Q24H  ? feeding supplement  237 mL Oral Q24H  ? leptospermum manuka honey  1 application. Topical Daily  ? multivitamin with minerals  1 tablet Oral Daily  ? ?Continuous Infusions: ? ? ? ? ? ? ? ?Aline August, MD ?[facility] ?08/08/2021, 1:49 PM  ? ?

## 2021-08-08 NOTE — Care Management Important Message (Signed)
Important Message ? ?Patient Details IM Letter placed in Patients room. ?Name: John Rose ?MRN: [id number] ?Date of Birth: Nov 30, 1922 ? ? ?Medicare Important Message Given:  Yes ? ? ? ? ?Caren Macadam ?08/08/2021, 2:49 PM ?

## 2021-08-08 NOTE — [place] FL2 (Signed)
?[place] MEDICAID FL2 LEVEL OF CARE SCREENING TOOL  ?  ? ?IDENTIFICATION  ?Patient Name: ?John Rose Birthdate: [birth year] Sex: male Admission Date (Current Location): ?08/04/2021  ?[place] and [place] Number: ? [place] ?  Facility and Address:  ?[facility],  [address], [place] [address] ?     Provider Number: ?[id number]  ?Attending Physician Name and Address:  ?Glade Lloyd, MD ? Relative Name and Phone Number:  ?Deveon Kisiel  (Son)   [phone] ?   ?Current Level of Care: ?Hospital Recommended Level of Care: ?Skilled Nursing Facility Prior Approval Number: ?  ? ?Date Approved/Denied: ?  PASRR Number: ?[id number] A ? ?Discharge Plan: ?SNF ?  ? ?Current Diagnoses: ?Patient Active Problem List  ? Diagnosis Date Noted  ? Rhabdomyolysis 08/04/2021  ? Elevated troponin 08/04/2021  ? Abnormal LFTs 08/04/2021  ? Thrombocytopenia ([facility]) 08/04/2021  ? Aortic atherosclerosis ([facility]) 08/04/2021  ? Pleural effusion 08/04/2021  ? Pain of left hip joint 04/13/2021  ? Lumbar spondylosis 11/10/2016  ? ? ?Orientation RESPIRATION BLADDER Height & Weight   ?  ?Self, Situation, Place ? Normal External catheter Weight: 71.4 kg ?Height:  5\' 8"  (172.7 cm)  ?BEHAVIORAL SYMPTOMS/MOOD NEUROLOGICAL BOWEL NUTRITION STATUS  ?    Incontinent Diet (Heart healthy)  ?AMBULATORY STATUS COMMUNICATION OF NEEDS Skin   ?Extensive Assist Verbally Skin abrasions ?  ?  ?  ?    ?     ?     ? ? ?Personal Care Assistance Level of Assistance  ?Bathing, Feeding, Dressing Bathing Assistance: Limited assistance ?Feeding assistance: Limited assistance ?Dressing Assistance: Limited assistance ?   ? ?Functional Limitations Info  ?Sight, Hearing, Speech Sight Info: Adequate ?Hearing Info: Adequate ?Speech Info: Adequate  ? ? ?SPECIAL CARE FACTORS FREQUENCY  ?PT (By licensed PT), OT (By licensed OT)   ?  ?PT Frequency: 5x/week ?OT Frequency: 5x/week ?  ?  ?  ?   ? ? ?Contractures Contractures Info: Not present  ? ? ?Additional Factors Info   ?Code Status, Allergies Code Status Info: Full ?Allergies Info: No known allergies ?  ?  ?  ?   ? ?Current Medications (08/08/2021):  This is the current hospital active medication list ?Current Facility-Administered Medications  ?Medication Dose Route Frequency Provider Last Rate Last Admin  ? acetaminophen (TYLENOL) tablet 650 mg  650 mg Oral Q6H PRN 10/08/2021, MD   650 mg at 08/06/21 0746  ? Or  ? acetaminophen (TYLENOL) suppository 650 mg  650 mg Rectal Q6H PRN 10/06/21, MD      ? ammonium lactate (LAC-HYDRIN) 12 % lotion   Topical PRN Bobette Mo, MD      ? enoxaparin (LOVENOX) injection 40 mg  40 mg Subcutaneous Q24H Rhetta Mura, MD   40 mg at 08/07/21 2322  ? feeding supplement (BOOST / RESOURCE BREEZE) liquid 1 Container  1 Container Oral Q24H 2323, MD   1 Container at 08/07/21 [id number]  ? feeding supplement (ENSURE ENLIVE / ENSURE PLUS) liquid 237 mL  237 mL Oral Q24H 0539, MD   237 mL at 08/07/21 1825  ? leptospermum manuka honey (MEDIHONEY) paste 1 application.  1 application. Topical Daily 10/07/21, MD   1 application. at 08/07/21 10/07/21  ? multivitamin with minerals tablet 1 tablet  1 tablet Oral Daily 7673, MD   1 tablet at 08/07/21 0935  ? ondansetron (ZOFRAN) tablet 4 mg  4 mg Oral Q6H PRN 10/07/21, MD      ?  Or  ? ondansetron (ZOFRAN) injection 4 mg  4 mg Intravenous Q6H PRN Bobette Mo, MD      ? ? ? ?Discharge Medications: ?Please see discharge summary for a list of discharge medications. ? ?Relevant Imaging Results: ? ?Relevant Lab Results: ? ? ?Additional Information ?SS# [id number]; covid-19 vaccines - none on file ? ?Jamar Casagrande, RN ? ? ? ? ?

## 2021-08-08 NOTE — Progress Notes (Signed)
Physical Therapy Treatment ?Patient Details ?Name: John Rose ?MRN: [id number] ?DOB: [birth year] ?Today's Date: 08/08/2021 ? ? ?History of Present Illness John Rose is a 88 y.o. male who lives alone at home with medical history significant of lumbar spondylosis, testosterone deficiency who was found on the floor for an unknown period of time. ? ?  ?PT Comments  ? ? Progressing with mobility. He was able to walk 5 feet x 2 with a RW on today. He did not c/o much pain but he remains weak. Continues to require cueing for safe mobility. PT recommendation is for ST SNF to regain independence with functional mobility.  ?   ?Recommendations for follow up therapy are one component of a multi-disciplinary discharge planning process, led by the attending physician.  Recommendations may be updated based on patient status, additional functional criteria and insurance authorization. ? ?Follow Up Recommendations ? Skilled nursing-short term rehab (<3 hours/day) ?  ?  ?Assistance Recommended at Discharge Frequent or constant Supervision/Assistance  ?Patient can return home with the following Assist for transportation;Assistance with cooking/housework;A lot of help with bathing/dressing/bathroom;A little help with walking and/or transfers ?  ?Equipment Recommendations ? None recommended by PT  ?  ?Recommendations for Other Services   ? ? ?  ?Precautions / Restrictions Precautions ?Precautions: Fall ?Restrictions ?Weight Bearing Restrictions: No  ?  ? ?Mobility ? Bed Mobility ?Overal bed mobility: Needs Assistance ?Bed Mobility: Supine to Sit, Sit to Supine ?  ?  ?Supine to sit: Mod assist, +2 for physical assistance, +2 for safety/equipment, HOB elevated ?Sit to supine: Mod assist, +2 for physical assistance, +2 for safety/equipment, HOB elevated ?  ?General bed mobility comments: Increased time. Cues for safety, technique. Assist for trunk and bil LEs. ?  ? ?Transfers ?Overall transfer level: Needs assistance ?Equipment used:  Rolling walker (2 wheels) ?Transfers: Sit to/from Stand ?Sit to Stand: Mod assist, +2 physical assistance, +2 safety/equipment, From elevated surface ?  ?  ?  ?  ?  ?General transfer comment: Assist to rise, power up, stabilize, control descent. Cues for safety, hand placement, technique. ?  ? ?Ambulation/Gait ?Ambulation/Gait assistance: Min assist, +2 safety/equipment ?Gait Distance (Feet): 5 Feet ?Assistive device: Rolling walker (2 wheels) ?Gait Pattern/deviations: Decreased step length - right, Decreased step length - left, Trunk flexed ?  ?  ?  ?General Gait Details: Cues for safety, technique, posture, RW proximity. Assist to stabilize pt and manage RW. Pt walked ~ 5 feet x 2. ? ? ?Stairs ?  ?  ?  ?  ?  ? ? ?Wheelchair Mobility ?  ? ?Modified Rankin (Stroke Patients Only) ?  ? ? ?  ?Balance Overall balance assessment: Needs assistance ?  ?  ?  ?  ?Standing balance support: Bilateral upper extremity supported ?Standing balance-Leahy Scale: Poor ?  ?  ?  ?  ?  ?  ?  ?  ?  ?  ?  ?  ?  ? ?  ?Cognition Arousal/Alertness: Awake/alert ?Behavior During Therapy: [facility] for tasks assessed/performed ?Overall Cognitive Status: Impaired/Different from baseline ?  ?  ?  ?  ?  ?  ?  ?  ?  ?  ?  ?  ?Following Commands: Follows one step commands with increased time ?  ?  ?Problem Solving: Slow processing, Requires verbal cues ?General Comments: Knew he was in the hospital due to a fall.  [place].  Had difficulty with  questions regarding PLOF. ?  ?  ? ?  ?Exercises   ? ?  ?General Comments   ?  ?  ? ?Pertinent Vitals/Pain Pain Assessment ?Faces Pain Scale: No hurt  ? ? ?Home Living   ?  ?  ?  ?  ?  ?  ?  ?  ?  ?   ?  ?Prior Function    ?  ?  ?   ? ?PT Goals (current goals can now be found in the care plan section) Progress towards PT goals: Progressing toward goals ? ?  ?Frequency ? ? ? Min 2X/week ? ? ? ?  ?PT Plan Current plan remains appropriate   ? ? ?Co-evaluation   ?  ?  ?  ?  ? ?  ?AM-PAC PT "6 Clicks" Mobility   ?Outcome Measure ? Help needed turning from your back to your side while in a flat bed without using bedrails?: A Lot ?Help needed moving from lying on your back to sitting on the side of a flat bed without using bedrails?: A Lot ?Help needed moving to and from a bed to a chair (including a wheelchair)?: A Little ?Help needed standing up from a chair using your arms (e.g., wheelchair or bedside chair)?: A Lot ?Help needed to walk in hospital room?: A Little ?Help needed climbing 3-5 steps with a railing? : Total ?6 Click Score: 13 ? ?  ?End of Session Equipment Utilized During Treatment: Gait belt ?Activity Tolerance: Patient tolerated treatment well ?Patient left: in bed;with call bell/phone within reach;with family/visitor present ?  ?PT Visit Diagnosis: Muscle weakness (generalized) (M62.81);Difficulty in walking, not elsewhere classified (R26.2) ?  ? ? ?Time: 7824-2353 ?PT Time Calculation (min) (ACUTE ONLY): 15 min ? ?Charges:  $Gait Training: 8-22 mins          ?          ? ? ? ? ?Faye Ramsay, PT ?Acute Rehabilitation  ?Office: [phone] ?Pager: [phone] ? ?  ? ?

## 2021-08-09 DIAGNOSIS — N179 Acute kidney failure, unspecified: Secondary | ICD-10-CM | POA: Diagnosis not present

## 2021-08-09 DIAGNOSIS — N390 Urinary tract infection, site not specified: Secondary | ICD-10-CM | POA: Diagnosis not present

## 2021-08-09 DIAGNOSIS — N1832 Chronic kidney disease, stage 3b: Secondary | ICD-10-CM | POA: Diagnosis not present

## 2021-08-09 DIAGNOSIS — Z79899 Other long term (current) drug therapy: Secondary | ICD-10-CM | POA: Diagnosis not present

## 2021-08-09 DIAGNOSIS — Z7401 Bed confinement status: Secondary | ICD-10-CM | POA: Diagnosis not present

## 2021-08-09 DIAGNOSIS — M47816 Spondylosis without myelopathy or radiculopathy, lumbar region: Secondary | ICD-10-CM | POA: Diagnosis not present

## 2021-08-09 DIAGNOSIS — D631 Anemia in chronic kidney disease: Secondary | ICD-10-CM | POA: Diagnosis not present

## 2021-08-09 DIAGNOSIS — G928 Other toxic encephalopathy: Secondary | ICD-10-CM | POA: Diagnosis not present

## 2021-08-09 DIAGNOSIS — E039 Hypothyroidism, unspecified: Secondary | ICD-10-CM | POA: Diagnosis not present

## 2021-08-09 DIAGNOSIS — M6282 Rhabdomyolysis: Secondary | ICD-10-CM | POA: Diagnosis not present

## 2021-08-09 DIAGNOSIS — L89890 Pressure ulcer of other site, unstageable: Secondary | ICD-10-CM | POA: Diagnosis not present

## 2021-08-09 DIAGNOSIS — J90 Pleural effusion, not elsewhere classified: Secondary | ICD-10-CM | POA: Diagnosis not present

## 2021-08-09 DIAGNOSIS — T796XXA Traumatic ischemia of muscle, initial encounter: Secondary | ICD-10-CM | POA: Diagnosis not present

## 2021-08-09 DIAGNOSIS — R778 Other specified abnormalities of plasma proteins: Secondary | ICD-10-CM | POA: Diagnosis not present

## 2021-08-09 DIAGNOSIS — E349 Endocrine disorder, unspecified: Secondary | ICD-10-CM | POA: Diagnosis not present

## 2021-08-09 DIAGNOSIS — N400 Enlarged prostate without lower urinary tract symptoms: Secondary | ICD-10-CM | POA: Diagnosis not present

## 2021-08-09 DIAGNOSIS — S81801A Unspecified open wound, right lower leg, initial encounter: Secondary | ICD-10-CM | POA: Diagnosis not present

## 2021-08-09 DIAGNOSIS — G9341 Metabolic encephalopathy: Secondary | ICD-10-CM | POA: Diagnosis not present

## 2021-08-09 DIAGNOSIS — N401 Enlarged prostate with lower urinary tract symptoms: Secondary | ICD-10-CM | POA: Diagnosis not present

## 2021-08-09 DIAGNOSIS — I700 Atherosclerosis of aorta: Secondary | ICD-10-CM | POA: Diagnosis not present

## 2021-08-09 DIAGNOSIS — D72819 Decreased white blood cell count, unspecified: Secondary | ICD-10-CM | POA: Diagnosis not present

## 2021-08-09 DIAGNOSIS — R7989 Other specified abnormal findings of blood chemistry: Secondary | ICD-10-CM | POA: Diagnosis not present

## 2021-08-09 DIAGNOSIS — R55 Syncope and collapse: Secondary | ICD-10-CM | POA: Diagnosis not present

## 2021-08-09 DIAGNOSIS — D696 Thrombocytopenia, unspecified: Secondary | ICD-10-CM | POA: Diagnosis not present

## 2021-08-09 DIAGNOSIS — N178 Other acute kidney failure: Secondary | ICD-10-CM | POA: Diagnosis not present

## 2021-08-09 DIAGNOSIS — L089 Local infection of the skin and subcutaneous tissue, unspecified: Secondary | ICD-10-CM | POA: Diagnosis not present

## 2021-08-09 DIAGNOSIS — R5383 Other fatigue: Secondary | ICD-10-CM | POA: Diagnosis not present

## 2021-08-09 DIAGNOSIS — D638 Anemia in other chronic diseases classified elsewhere: Secondary | ICD-10-CM | POA: Diagnosis not present

## 2021-08-09 DIAGNOSIS — T796XXD Traumatic ischemia of muscle, subsequent encounter: Secondary | ICD-10-CM | POA: Diagnosis not present

## 2021-08-09 DIAGNOSIS — R7401 Elevation of levels of liver transaminase levels: Secondary | ICD-10-CM | POA: Diagnosis not present

## 2021-08-09 DIAGNOSIS — R4182 Altered mental status, unspecified: Secondary | ICD-10-CM | POA: Diagnosis not present

## 2021-08-09 DIAGNOSIS — W19XXXA Unspecified fall, initial encounter: Secondary | ICD-10-CM | POA: Diagnosis not present

## 2021-08-09 DIAGNOSIS — L89312 Pressure ulcer of right buttock, stage 2: Secondary | ICD-10-CM | POA: Diagnosis not present

## 2021-08-09 LAB — CBC WITH DIFFERENTIAL/PLATELET
Abs Immature Granulocytes: 0.02 10*3/uL (ref 0.00–0.07)
Basophils Absolute: 0 10*3/uL (ref 0.0–0.1)
Basophils Relative: 1 %
Eosinophils Absolute: 0.1 10*3/uL (ref 0.0–0.5)
Eosinophils Relative: 2 %
HCT: 30.6 % — ABNORMAL LOW (ref 39.0–52.0)
Hemoglobin: 9.8 g/dL — ABNORMAL LOW (ref 13.0–17.0)
Immature Granulocytes: 1 %
Lymphocytes Relative: 26 %
Lymphs Abs: 1 10*3/uL (ref 0.7–4.0)
MCH: 32.3 pg (ref 26.0–34.0)
MCHC: 32 g/dL (ref 30.0–36.0)
MCV: 101 fL — ABNORMAL HIGH (ref 80.0–100.0)
Monocytes Absolute: 0.6 10*3/uL (ref 0.1–1.0)
Monocytes Relative: 14 %
Neutro Abs: 2.2 10*3/uL (ref 1.7–7.7)
Neutrophils Relative %: 56 %
Platelets: 142 10*3/uL — ABNORMAL LOW (ref 150–400)
RBC: 3.03 MIL/uL — ABNORMAL LOW (ref 4.22–5.81)
RDW: 13.4 % (ref 11.5–15.5)
WBC: 3.9 10*3/uL — ABNORMAL LOW (ref 4.0–10.5)
nRBC: 0 % (ref 0.0–0.2)

## 2021-08-09 LAB — COMPREHENSIVE METABOLIC PANEL
ALT: 38 U/L (ref 0–44)
AST: 43 U/L — ABNORMAL HIGH (ref 15–41)
Albumin: 2.6 g/dL — ABNORMAL LOW (ref 3.5–5.0)
Alkaline Phosphatase: 44 U/L (ref 38–126)
Anion gap: 6 (ref 5–15)
BUN: 26 mg/dL — ABNORMAL HIGH (ref 8–23)
CO2: 28 mmol/L (ref 22–32)
Calcium: 8.1 mg/dL — ABNORMAL LOW (ref 8.9–10.3)
Chloride: 105 mmol/L (ref 98–111)
Creatinine, Ser: 1.26 mg/dL — ABNORMAL HIGH (ref 0.61–1.24)
GFR, Estimated: 52 mL/min — ABNORMAL LOW (ref >60)
Glucose, Bld: 110 mg/dL — ABNORMAL HIGH (ref 70–99)
Potassium: 4.1 mmol/L (ref 3.5–5.1)
Sodium: 139 mmol/L (ref 135–145)
Total Bilirubin: 0.8 mg/dL (ref 0.3–1.2)
Total Protein: 4.8 g/dL — ABNORMAL LOW (ref 6.5–8.1)

## 2021-08-09 LAB — VITAMIN B12: Vitamin B-12: 211 pg/mL (ref 180–914)

## 2021-08-09 LAB — CK: Total CK: 241 U/L (ref 49–397)

## 2021-08-09 LAB — MAGNESIUM: Magnesium: 2.1 mg/dL (ref 1.7–2.4)

## 2021-08-09 MED ORDER — VITAMIN B-12 1000 MCG PO TABS
1000.0000 ug | ORAL_TABLET | Freq: Every day | ORAL | Status: DC
Start: 2021-08-10 — End: 2021-08-09

## 2021-08-09 MED ORDER — CYANOCOBALAMIN 1000 MCG PO TABS: 1000.0000 ug | ORAL_TABLET | Freq: Every day | ORAL | Status: DC

## 2021-08-09 MED ORDER — ANDROGEL PUMP 20.25 MG/ACT (1.62%) TD GEL: 1.0000 | Freq: Every day | TRANSDERMAL | 0 refills | Status: DC

## 2021-08-09 MED ORDER — CYANOCOBALAMIN 1000 MCG/ML IJ SOLN
1000.0000 ug | Freq: Once | INTRAMUSCULAR | Status: AC
  Administered 2021-08-09: 1000 ug via INTRAMUSCULAR
  Filled 2021-08-09: qty 1

## 2021-08-09 MED ORDER — MEDIHONEY WOUND/BURN DRESSING EX PSTE: 1.0000 "application " | PASTE | Freq: Every day | CUTANEOUS | 0 refills | Status: DC

## 2021-08-09 NOTE — Plan of Care (Signed)
Plan of care reviewed. 

## 2021-08-09 NOTE — Discharge Summary (Signed)
Physician Discharge Summary  ?John Rose [id number] DOB: 10-Nov-1922 DOA: 08/04/2021 ? ?PCP: Jani Gravel, MD ? ?Admit date: 08/04/2021 ?Discharge date: 08/09/2021 ? ?Admitted From: Home ?Disposition: SNF ? ?Recommendations for Outpatient Follow-up:  ?Follow up with PCP in 1 week with repeat CBC/BMP ?Follow up in ED if symptoms worsen or new appear ? ? ?Home Health: No ?Equipment/Devices: None ? ?Discharge Condition: Stable ?CODE STATUS: Full ?Diet recommendation: Heart healthy ? ?Brief/Interim Summary: ?88 y.o. male who lives alone at home with medical history significant of lumbar spondylosis, testosterone deficiency, BPH, CKD stage IIIb, multinodular goiter, vitamin B12 was found on floor at home by neighbor.  On presentation, he was encephalopathic with total CK of 3986, AST 492, ALT 60 with.  CT of the head was negative for any intracranial hemorrhage.  MRI of brain was negative for intracranial abnormality.  CT C-spine showed no spinal fracture.  CTA chest showed small right pleural effusion but no PE.  He was started on IV fluids.  PT recommended SNF placement. He will be discharged to SNF once bed is available. ? ?Discharge Diagnoses:  ? ?Acute rhabdomyolysis ?Physical deconditioning ?-Patient was found on the floor at home by neighbor.  CK3 986 on presentation.  Treated with IV fluids.  CK 241 today.  Off IV fluids. ?-PT recommends SNF placement.  DC to SNF once bed is available ?  ?Acute toxic/metabolic encephalopathy ?-Secondary to AKI and rhabdomyolysis. ?-CT of the brain and MRI of the brain was negative for acute intracranial abnormality ?-Fall precautions.  Mental status improving.  Still slow to respond. ?  ?AKI on CKD stage IIIb ?-Creatinine 1.26 today.  Out patient follow up ? ?Elevated LFTs ?-Possibly from rhabdomyolysis.  LFTs improving.  Monitor intermittently as an out patient ? ?Thrombocytopenia ?-Questionable cause.  No signs of bleeding.  Improving.  Monitor as an outpatient ? ?Anemia of chronic  disease ?-Hemoglobin currently stable.  No signs of bleeding.  Monitor intermittently as an outpatient ?  ?Leukopenia ?-Questionable cause. ? ?Troponin elevation ?-Possibly nonischemic.  Troponins did not elevate significantly.  Echo did not show any wall motion abnormalities.  No further work-up needed ? ?Right shin wound: Present on admission ?-Wound care as per wound care consult recommendations ?  ?Vitamin B12 deficiency ?-Vitamin B-12 levels low in the past.  Vitamin B12 211 this admission.  Will start supplementation. ? ?Discharge Instructions ? ?Discharge Instructions   ? ? Diet - low sodium heart healthy   Complete by: As directed ?  ? Discharge wound care:   Complete by: As directed ?  ? As per wound care consult recommendations  ? Increase activity slowly   Complete by: As directed ?  ? ?  ? ?Allergies as of 08/09/2021   ?No Known Allergies ?  ? ?  ?Medication List  ?  ? ?STOP taking these medications   ? ?gabapentin 300 MG capsule ?Commonly known as: NEURONTIN ?  ? ?  ? ?TAKE these medications   ? ?acetaminophen 650 MG CR tablet ?Commonly known as: TYLENOL ?Take 650 mg by mouth every 8 (eight) hours as needed for pain. ?  ?AndroGel Pump 20.25 MG/ACT (1.62%) Gel ?Generic drug: Testosterone ?Apply 1 Dose topically daily. ?  ?cyanocobalamin 1000 MCG tablet ?Take 1 tablet (1,000 mcg total) by mouth daily. ?Start taking on: Aug 10, 2021 ?  ?furosemide 40 MG tablet ?Commonly known as: LASIX ?Take 20 mg by mouth daily as needed for fluid. ?  ?leptospermum manuka honey Pste paste ?Apply 1 application.  topically daily. Apply to right lateral lower leg wound (just below the knee) and cover with a foam dressing. Apply thin layer (3 mm) to wound. ?Start taking on: Aug 10, 2021 ?  ?tamsulosin 0.4 MG Caps capsule ?Commonly known as: FLOMAX ?Take 0.4 mg by mouth daily. ?  ? ?  ? ?  ?  ? ? ?  ?Discharge Care Instructions  ?(From admission, onward)  ?  ? ? ?  ? ?  Start     Ordered  ? 08/09/21 0000  Discharge wound care:        ?Comments: As per wound care consult recommendations  ? 08/09/21 1114  ? ?  ?  ? ?  ? ? ?No Known Allergies ? ?Consultations: ?None ? ? ?Procedures/Studies: ?CT Head Wo Contrast ? ?Result Date: 08/04/2021 ?CLINICAL DATA:  Head trauma, minor (Age >= 65y) EXAM: CT HEAD WITHOUT CONTRAST TECHNIQUE: Contiguous axial images were obtained from the base of the skull through the vertex without intravenous contrast. RADIATION DOSE REDUCTION: This exam was performed according to the departmental dose-optimization program which includes automated exposure control, adjustment of the mA and/or kV according to patient size and/or use of iterative reconstruction technique. COMPARISON:  None Available. FINDINGS: Brain: There is no acute intracranial hemorrhage, mass effect, or edema. Gray-white differentiation is preserved. There is no extra-axial fluid collection. Prominence of the ventricles and sulci reflects parenchymal volume loss. Patchy hypoattenuation in the supratentorial white matter is nonspecific but may reflect mild chronic microvascular ischemic changes. Age indeterminate small vessel infarcts of the right basal ganglia and adjacent white matter and possibly the posterior right cerebellum. Vascular: No hyperdense vessel.There is atherosclerotic calcification at the skull base. Skull: Calvarium is unremarkable. Sinuses/Orbits: No acute finding. Other: Mastoid air cells are clear. IMPRESSION: No acute intracranial hemorrhage. 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 Mild chronic microvascular ischemic changes. Electronically Signed   By: Macy Mis M.D.   On: 08/04/2021 11:51  ? ?CT Angio Chest PE W and/or Wo Contrast ? ?Result Date: 08/04/2021 ?CLINICAL DATA:  Unknown D-dimer, redness noted of the right hip with small abrasions. Found on the floor. EXAM: CT ANGIOGRAPHY CHEST WITH CONTRAST TECHNIQUE: Multidetector CT imaging of the chest  was performed using the standard protocol during bolus administration of intravenous contrast. Multiplanar CT image reconstructions and MIPs were obtained to evaluate the vascular anatomy. RADIATION DOSE REDUCTION: This exam was performed according to the departmental dose-optimization program which includes automated exposure control, adjustment of the mA and/or kV according to patient size and/or use of iterative reconstruction technique. CONTRAST:  60mL OMNIPAQUE IOHEXOL 350 MG/ML SOLN COMPARISON:  None Available. FINDINGS: Cardiovascular: Satisfactory opacification of the pulmonary arteries to the segmental level. No evidence of pulmonary embolism. Normal heart size. No pericardial effusion. Thoracic aortic atherosclerosis. Mediastinum/Nodes: No enlarged mediastinal, hilar, or axillary lymph nodes. Trachea and esophagus demonstrate no significant findings. 3 cm ill-defined hypodense left thyroid mass of indeterminate etiology. In the setting of significant comorbidities or limited life expectancy, no follow-up recommended (ref: J Am Coll Radiol. 2015 Feb;12(2): 143-50). Lungs/Pleura: Small right pleural effusion. No left pleural effusion. No focal consolidation. No pneumothorax. No pulmonary mass. Mild bilateral lower lobe bronchial thickening. Upper Abdomen: No acute abnormality.  4.4 cm left hepatic cyst. Musculoskeletal: No acute osseous abnormality. No aggressive osseous lesion. Bilateral sternoclavicular joint arthropathy. Review of the MIP images confirms the above findings. IMPRESSION: 1. No pulmonary embolism. 2. Small right pleural effusion.  3. Aortic Atherosclerosis (ICD10-I70.0). Electronically Signed   By: Kathreen Devoid M.D.   On: 08/04/2021 11:57  ? ?CT Cervical Spine Wo Contrast ? ?Result Date: 08/04/2021 ?CLINICAL DATA:  Neck trauma (Age >= 65y) EXAM: CT CERVICAL SPINE WITHOUT CONTRAST TECHNIQUE: Multidetector CT imaging of the cervical spine was performed without intravenous contrast. Multiplanar CT  image reconstructions were also generated. RADIATION DOSE REDUCTION: This exam was performed according to the departmental dose-optimization program which includes automated exposure control, adjustm

## 2021-08-09 NOTE — Progress Notes (Signed)
?PROGRESS NOTE ? ? ? ?John Rose  [id number] DOB: May 19, 1922 DOA: 08/04/2021 ?PCP: Jani Gravel, MD  ? ?Brief Narrative:  ?89 y.o. male who lives alone at home with medical history significant of lumbar spondylosis, testosterone deficiency, BPH, CKD stage IIIb, multinodular goiter, vitamin B12 was found on floor at home by neighbor.  On presentation, he was encephalopathic with total CK of 3986, AST 492, ALT 60 with.  CT of the head was negative for any intracranial hemorrhage.  MRI of brain was negative for intracranial abnormality.  CT C-spine showed no spinal fracture.  CTA chest showed small right pleural effusion but no PE.  He was started on IV fluids.  PT recommended SNF placement. ? ?Assessment & Plan: ?  ?Acute rhabdomyolysis ?Physical deconditioning ?-Patient was found on the floor at home by neighbor.  CK3 986 on presentation.  Treated with IV fluids.  CK 241 today.  Off IV fluids. ?-PT recommends SNF placement.  Social worker following ? ?Acute toxic/metabolic encephalopathy ?-Secondary to AKI and rhabdomyolysis. ?-CT of the brain and MRI of the brain was negative for acute intracranial abnormality ?-Fall precautions.  Mental status improving.  Still slow to respond. ? ?AKI on CKD stage IIIb ?-Creatinine 1.26 today.  Monitor ? ?Elevated LFTs ?-Possibly from rhabdomyolysis.  LFTs improving.  Monitor intermittently ? ?Thrombocytopenia ?-Questionable cause.  No signs of bleeding.  Improving.  Monitor ? ?Anemia of chronic disease ?-Hemoglobin currently stable.  No signs of bleeding.  Monitor intermittently ? ?Leukopenia ?-Questionable cause. ? ?Troponin elevation ?-Possibly nonischemic.  Troponins did not elevate significantly.  Echo did not show any wall motion abnormalities.  No further work-up needed ? ?Right shin wound: Present on admission ?-Wound care as per wound care consult recommendations ? ?Vitamin B12 deficiency ?-Vitamin B-12 levels low in the past.  Vitamin B12 211 this admission.  Will start  supplementation. ? ?DVT prophylaxis: Lovenox ?Code Status: Full ?Family Communication: None at bedside ?Disposition Plan: ?Status is: Inpatient ?Remains inpatient appropriate because: Of need for SNF placement ? ? ? ?Consultants: None ? ?Procedures: None ? ?Antimicrobials: None ? ?Subjective: ?Patient seen and examined at bedside.  Poor historian.  No agitation, vomiting or fever reported. ?Objective: ?Vitals:  ? 08/08/21 1437 08/08/21 2227 08/09/21 0524 08/09/21 0735  ?BP: (!) 94/57 126/73 131/69 124/66  ?Pulse:  86 75 73  ?Resp:  18 18 15   ?Temp:  98.2 ?F (36.8 ?C) 98.1 ?F (36.7 ?C) 97.8 ?F (36.6 ?C)  ?TempSrc:  Oral Oral Oral  ?SpO2:  100% 98% 100%  ?Weight:      ?Height:      ? ? ?Intake/Output Summary (Last 24 hours) at 08/09/2021 0740 ?Last data filed at 08/09/2021 0600 ?Gross per 24 hour  ?Intake 480 ml  ?Output 2700 ml  ?Net -2220 ml  ? ? ?Filed Weights  ? 08/04/21 2018  ?Weight: 71.4 kg  ? ? ?Examination: ? ?General exam: No distress.  On room air.  Poor historian.  Slow to respond.  Answers some questions. ?Respiratory system: Decreased breath sounds at bases bilaterally with scattered crackles  ?cardiovascular system: Rate controlled currently; S1-S2 heard ?Gastrointestinal system: Abdomen is distended slightly; soft and nontender.  Bowel sounds are heard ?Extremities: Mild lower extremity edema present; no clubbing  ? ?data Reviewed: I have personally reviewed following labs and imaging studies ? ?CBC: ?Recent Labs  ?Lab 08/04/21 ?1026 08/05/21 ?0403 08/06/21 ?0400 08/09/21 ?0400  ?WBC 7.9 5.9 4.1 3.9*  ?NEUTROABS 6.8  --   --  2.2  ?  HGB 13.1 11.5* 9.9* 9.8*  ?HCT 39.5 36.5* 29.7* 30.6*  ?MCV 98.3 102.5* 98.7 101.0*  ?PLT 137* 115* 99* 142*  ? ? ?Basic Metabolic Panel: ?Recent Labs  ?Lab 08/05/21 ?0403 08/06/21 ?0400 08/07/21 ?0359 08/08/21 ?[id number] 08/09/21 ?0400  ?NA 140 140 137 138 139  ?K 4.0 3.9 3.8 4.2 4.1  ?CL 111 109 105 106 105  ?CO2 22 25 26 29 28   ?GLUCOSE 129* 117* 107* 115* 110*  ?BUN 31* 26* 21  27* 26*  ?CREATININE 1.18 1.05 1.01 1.28* 1.26*  ?CALCIUM 8.3* 8.1* 8.1* 8.1* 8.1*  ?MG  --   --   --   --  2.1  ?PHOS  --  2.3*  --   --   --   ? ? ?GFR: ?Estimated Creatinine Clearance: 33.2 mL/min (A) (by C-G formula based on SCr of 1.26 mg/dL (H)). ?Liver Function Tests: ?Recent Labs  ?Lab 08/04/21 ?1026 08/05/21 ?0403 08/06/21 ?0400 08/08/21 ?[id number] 08/09/21 ?0400  ?AST 192* 140* 103* 56* 43*  ?ALT 67* 59* 52* 45* 38  ?ALKPHOS 62 52 44 45 44  ?BILITOT 1.4* 1.0 0.8 0.9 0.8  ?PROT 6.1* 5.0* 4.8* 4.8* 4.8*  ?ALBUMIN 3.5 2.9* 2.7* 2.6* 2.6*  ? ? ?No results for input(s): LIPASE, AMYLASE in the last 168 hours. ?No results for input(s): AMMONIA in the last 168 hours. ?Coagulation Profile: ?No results for input(s): INR, PROTIME in the last 168 hours. ?Cardiac Enzymes: ?Recent Labs  ?Lab 08/05/21 ?0403 08/06/21 ?0400 08/07/21 ?0359 08/08/21 ?[id number] 08/09/21 ?0400  ?CKTOTAL 2,260* 1,117* 632* 395 241  ? ? ?BNP (last 3 results) ?No results for input(s): PROBNP in the last 8760 hours. ?HbA1C: ?No results for input(s): HGBA1C in the last 72 hours. ?CBG: ?No results for input(s): GLUCAP in the last 168 hours. ?Lipid Profile: ?No results for input(s): CHOL, HDL, LDLCALC, TRIG, CHOLHDL, LDLDIRECT in the last 72 hours. ?Thyroid Function Tests: ?No results for input(s): TSH, T4TOTAL, FREET4, T3FREE, THYROIDAB in the last 72 hours. ?Anemia Panel: ?Recent Labs  ?  08/09/21 ?0400  ?VITAMINB12 211  ? ?Sepsis Labs: ?No results for input(s): PROCALCITON, LATICACIDVEN in the last 168 hours. ? ?No results found for this or any previous visit (from the past 240 hour(s)).  ? ? ? ? ? ?Radiology Studies: ?No results found. ? ? ? ? ? ?Scheduled Meds: ? enoxaparin (LOVENOX) injection  40 mg Subcutaneous Q24H  ? feeding supplement  1 Container Oral Q24H  ? feeding supplement  237 mL Oral Q24H  ? leptospermum manuka honey  1 application. Topical Daily  ? multivitamin with minerals  1 tablet Oral Daily  ? ?Continuous  Infusions: ? ? ? ? ? ? ? ?Aline August, MD ?[facility] ?08/09/2021, 7:40 AM  ? ?

## 2021-08-09 NOTE — Progress Notes (Signed)
Pt report called to nurse at [facility] rehab facility.  Pt given disposable gown for transport with PTAR.  Called pt son to inform of transfer.   ?

## 2021-08-09 NOTE — TOC Progression Note (Addendum)
Transition of Care (TOC) - Progression Note  ? ? ?Patient Details  ?Name: John Rose ?MRN: [id number] ?Date of Birth: March 03, 1923 ? ?Transition of Care (TOC) CM/SW Contact  ?Golda Acre, RN ?Phone Number: ?08/09/2021, 11:01 AM ? ?Clinical Narrative:    ?Tct-son linwood and from RN CM MCgibboney-family prefers that pt go to [facility] in [place]. ?Tct-Tracey hadren at [facility] will go to room 208.  Ptar called at 1124. Expect pick up between 1-2 pm. Floor rn aware.  Transport packet to clerk at [facility]. ? ?Expected Discharge Plan: Home w Home Health Services ?Barriers to Discharge: No Barriers Identified ? ?Expected Discharge Plan and Services ?Expected Discharge Plan: Home w Home Health Services ?  ?  ?Post Acute Care Choice: Home Health, Skilled Nursing Facility ?Living arrangements for the past 2 months: Single Family Home ?                ?  ?  ?  ?  ?  ?  ?  ?  ?  ?  ? ? ?Social Determinants of Health (SDOH) Interventions ?  ? ?Readmission Risk Interventions ?   ? View : No data to display.  ?  ?  ?  ? ? ?

## 2021-08-10 DIAGNOSIS — L89312 Pressure ulcer of right buttock, stage 2: Secondary | ICD-10-CM | POA: Diagnosis not present

## 2021-08-10 DIAGNOSIS — S81801A Unspecified open wound, right lower leg, initial encounter: Secondary | ICD-10-CM | POA: Diagnosis not present

## 2021-08-14 DIAGNOSIS — G9341 Metabolic encephalopathy: Secondary | ICD-10-CM | POA: Diagnosis not present

## 2021-08-14 DIAGNOSIS — N1832 Chronic kidney disease, stage 3b: Secondary | ICD-10-CM | POA: Diagnosis not present

## 2021-08-14 DIAGNOSIS — I700 Atherosclerosis of aorta: Secondary | ICD-10-CM | POA: Diagnosis not present

## 2021-08-14 DIAGNOSIS — M6282 Rhabdomyolysis: Secondary | ICD-10-CM | POA: Diagnosis not present

## 2021-08-16 DIAGNOSIS — M47816 Spondylosis without myelopathy or radiculopathy, lumbar region: Secondary | ICD-10-CM | POA: Diagnosis not present

## 2021-08-16 DIAGNOSIS — N400 Enlarged prostate without lower urinary tract symptoms: Secondary | ICD-10-CM | POA: Diagnosis not present

## 2021-08-16 DIAGNOSIS — E349 Endocrine disorder, unspecified: Secondary | ICD-10-CM | POA: Diagnosis not present

## 2021-08-16 DIAGNOSIS — L089 Local infection of the skin and subcutaneous tissue, unspecified: Secondary | ICD-10-CM | POA: Diagnosis not present

## 2021-08-24 DIAGNOSIS — S81801A Unspecified open wound, right lower leg, initial encounter: Secondary | ICD-10-CM | POA: Diagnosis not present

## 2021-08-24 DIAGNOSIS — L89312 Pressure ulcer of right buttock, stage 2: Secondary | ICD-10-CM | POA: Diagnosis not present

## 2021-08-30 DIAGNOSIS — N1832 Chronic kidney disease, stage 3b: Secondary | ICD-10-CM | POA: Diagnosis not present

## 2021-08-30 DIAGNOSIS — Z9181 History of falling: Secondary | ICD-10-CM | POA: Diagnosis not present

## 2021-08-30 DIAGNOSIS — R279 Unspecified lack of coordination: Secondary | ICD-10-CM | POA: Diagnosis not present

## 2021-08-30 DIAGNOSIS — G9341 Metabolic encephalopathy: Secondary | ICD-10-CM | POA: Diagnosis not present

## 2021-08-30 DIAGNOSIS — R2681 Unsteadiness on feet: Secondary | ICD-10-CM | POA: Diagnosis not present

## 2021-08-30 DIAGNOSIS — R278 Other lack of coordination: Secondary | ICD-10-CM | POA: Diagnosis not present

## 2021-08-30 DIAGNOSIS — M6281 Muscle weakness (generalized): Secondary | ICD-10-CM | POA: Diagnosis not present

## 2021-08-30 DIAGNOSIS — M6282 Rhabdomyolysis: Secondary | ICD-10-CM | POA: Diagnosis not present

## 2021-08-31 DIAGNOSIS — R279 Unspecified lack of coordination: Secondary | ICD-10-CM | POA: Diagnosis not present

## 2021-08-31 DIAGNOSIS — G9341 Metabolic encephalopathy: Secondary | ICD-10-CM | POA: Diagnosis not present

## 2021-08-31 DIAGNOSIS — Z9181 History of falling: Secondary | ICD-10-CM | POA: Diagnosis not present

## 2021-08-31 DIAGNOSIS — S81801A Unspecified open wound, right lower leg, initial encounter: Secondary | ICD-10-CM | POA: Diagnosis not present

## 2021-08-31 DIAGNOSIS — R278 Other lack of coordination: Secondary | ICD-10-CM | POA: Diagnosis not present

## 2021-08-31 DIAGNOSIS — L89312 Pressure ulcer of right buttock, stage 2: Secondary | ICD-10-CM | POA: Diagnosis not present

## 2021-08-31 DIAGNOSIS — N1832 Chronic kidney disease, stage 3b: Secondary | ICD-10-CM | POA: Diagnosis not present

## 2021-08-31 DIAGNOSIS — R2681 Unsteadiness on feet: Secondary | ICD-10-CM | POA: Diagnosis not present

## 2021-08-31 DIAGNOSIS — M6282 Rhabdomyolysis: Secondary | ICD-10-CM | POA: Diagnosis not present

## 2021-08-31 DIAGNOSIS — M6281 Muscle weakness (generalized): Secondary | ICD-10-CM | POA: Diagnosis not present

## 2021-09-01 DIAGNOSIS — N1832 Chronic kidney disease, stage 3b: Secondary | ICD-10-CM | POA: Diagnosis not present

## 2021-09-01 DIAGNOSIS — R279 Unspecified lack of coordination: Secondary | ICD-10-CM | POA: Diagnosis not present

## 2021-09-01 DIAGNOSIS — M6281 Muscle weakness (generalized): Secondary | ICD-10-CM | POA: Diagnosis not present

## 2021-09-01 DIAGNOSIS — G9341 Metabolic encephalopathy: Secondary | ICD-10-CM | POA: Diagnosis not present

## 2021-09-01 DIAGNOSIS — R278 Other lack of coordination: Secondary | ICD-10-CM | POA: Diagnosis not present

## 2021-09-01 DIAGNOSIS — Z9181 History of falling: Secondary | ICD-10-CM | POA: Diagnosis not present

## 2021-09-01 DIAGNOSIS — M6282 Rhabdomyolysis: Secondary | ICD-10-CM | POA: Diagnosis not present

## 2021-09-01 DIAGNOSIS — R2681 Unsteadiness on feet: Secondary | ICD-10-CM | POA: Diagnosis not present

## 2021-09-02 DIAGNOSIS — R2681 Unsteadiness on feet: Secondary | ICD-10-CM | POA: Diagnosis not present

## 2021-09-02 DIAGNOSIS — A0102 Typhoid fever with heart involvement: Secondary | ICD-10-CM | POA: Diagnosis not present

## 2021-09-02 DIAGNOSIS — G9341 Metabolic encephalopathy: Secondary | ICD-10-CM | POA: Diagnosis not present

## 2021-09-02 DIAGNOSIS — R0602 Shortness of breath: Secondary | ICD-10-CM | POA: Diagnosis not present

## 2021-09-02 DIAGNOSIS — N1832 Chronic kidney disease, stage 3b: Secondary | ICD-10-CM | POA: Diagnosis not present

## 2021-09-02 DIAGNOSIS — N390 Urinary tract infection, site not specified: Secondary | ICD-10-CM | POA: Diagnosis not present

## 2021-09-02 DIAGNOSIS — Z9181 History of falling: Secondary | ICD-10-CM | POA: Diagnosis not present

## 2021-09-02 DIAGNOSIS — R279 Unspecified lack of coordination: Secondary | ICD-10-CM | POA: Diagnosis not present

## 2021-09-02 DIAGNOSIS — M6282 Rhabdomyolysis: Secondary | ICD-10-CM | POA: Diagnosis not present

## 2021-09-02 DIAGNOSIS — M6281 Muscle weakness (generalized): Secondary | ICD-10-CM | POA: Diagnosis not present

## 2021-09-02 DIAGNOSIS — R278 Other lack of coordination: Secondary | ICD-10-CM | POA: Diagnosis not present

## 2021-09-05 DIAGNOSIS — M6281 Muscle weakness (generalized): Secondary | ICD-10-CM | POA: Diagnosis not present

## 2021-09-05 DIAGNOSIS — R278 Other lack of coordination: Secondary | ICD-10-CM | POA: Diagnosis not present

## 2021-09-05 DIAGNOSIS — M6282 Rhabdomyolysis: Secondary | ICD-10-CM | POA: Diagnosis not present

## 2021-09-05 DIAGNOSIS — R279 Unspecified lack of coordination: Secondary | ICD-10-CM | POA: Diagnosis not present

## 2021-09-05 DIAGNOSIS — R2681 Unsteadiness on feet: Secondary | ICD-10-CM | POA: Diagnosis not present

## 2021-09-05 DIAGNOSIS — Z9181 History of falling: Secondary | ICD-10-CM | POA: Diagnosis not present

## 2021-09-05 DIAGNOSIS — G9341 Metabolic encephalopathy: Secondary | ICD-10-CM | POA: Diagnosis not present

## 2021-09-05 DIAGNOSIS — N1832 Chronic kidney disease, stage 3b: Secondary | ICD-10-CM | POA: Diagnosis not present

## 2021-09-06 DIAGNOSIS — N1832 Chronic kidney disease, stage 3b: Secondary | ICD-10-CM | POA: Diagnosis not present

## 2021-09-06 DIAGNOSIS — Z9181 History of falling: Secondary | ICD-10-CM | POA: Diagnosis not present

## 2021-09-06 DIAGNOSIS — R279 Unspecified lack of coordination: Secondary | ICD-10-CM | POA: Diagnosis not present

## 2021-09-06 DIAGNOSIS — G9341 Metabolic encephalopathy: Secondary | ICD-10-CM | POA: Diagnosis not present

## 2021-09-06 DIAGNOSIS — R278 Other lack of coordination: Secondary | ICD-10-CM | POA: Diagnosis not present

## 2021-09-06 DIAGNOSIS — M6282 Rhabdomyolysis: Secondary | ICD-10-CM | POA: Diagnosis not present

## 2021-09-06 DIAGNOSIS — M6281 Muscle weakness (generalized): Secondary | ICD-10-CM | POA: Diagnosis not present

## 2021-09-06 DIAGNOSIS — R2681 Unsteadiness on feet: Secondary | ICD-10-CM | POA: Diagnosis not present

## 2021-09-07 DIAGNOSIS — S81801A Unspecified open wound, right lower leg, initial encounter: Secondary | ICD-10-CM | POA: Diagnosis not present

## 2021-09-07 DIAGNOSIS — N1832 Chronic kidney disease, stage 3b: Secondary | ICD-10-CM | POA: Diagnosis not present

## 2021-09-07 DIAGNOSIS — L89312 Pressure ulcer of right buttock, stage 2: Secondary | ICD-10-CM | POA: Diagnosis not present

## 2021-09-07 DIAGNOSIS — R278 Other lack of coordination: Secondary | ICD-10-CM | POA: Diagnosis not present

## 2021-09-07 DIAGNOSIS — G9341 Metabolic encephalopathy: Secondary | ICD-10-CM | POA: Diagnosis not present

## 2021-09-07 DIAGNOSIS — M6281 Muscle weakness (generalized): Secondary | ICD-10-CM | POA: Diagnosis not present

## 2021-09-07 DIAGNOSIS — M6282 Rhabdomyolysis: Secondary | ICD-10-CM | POA: Diagnosis not present

## 2021-09-07 DIAGNOSIS — R279 Unspecified lack of coordination: Secondary | ICD-10-CM | POA: Diagnosis not present

## 2021-09-07 DIAGNOSIS — Z9181 History of falling: Secondary | ICD-10-CM | POA: Diagnosis not present

## 2021-09-07 DIAGNOSIS — R2681 Unsteadiness on feet: Secondary | ICD-10-CM | POA: Diagnosis not present

## 2021-09-08 DIAGNOSIS — M6282 Rhabdomyolysis: Secondary | ICD-10-CM | POA: Diagnosis not present

## 2021-09-08 DIAGNOSIS — R2681 Unsteadiness on feet: Secondary | ICD-10-CM | POA: Diagnosis not present

## 2021-09-08 DIAGNOSIS — G9341 Metabolic encephalopathy: Secondary | ICD-10-CM | POA: Diagnosis not present

## 2021-09-08 DIAGNOSIS — N1832 Chronic kidney disease, stage 3b: Secondary | ICD-10-CM | POA: Diagnosis not present

## 2021-09-08 DIAGNOSIS — R278 Other lack of coordination: Secondary | ICD-10-CM | POA: Diagnosis not present

## 2021-09-08 DIAGNOSIS — M6281 Muscle weakness (generalized): Secondary | ICD-10-CM | POA: Diagnosis not present

## 2021-09-08 DIAGNOSIS — Z9181 History of falling: Secondary | ICD-10-CM | POA: Diagnosis not present

## 2021-09-08 DIAGNOSIS — R279 Unspecified lack of coordination: Secondary | ICD-10-CM | POA: Diagnosis not present

## 2021-09-08 DIAGNOSIS — N390 Urinary tract infection, site not specified: Secondary | ICD-10-CM | POA: Diagnosis not present

## 2021-09-09 DIAGNOSIS — G9341 Metabolic encephalopathy: Secondary | ICD-10-CM | POA: Diagnosis not present

## 2021-09-09 DIAGNOSIS — R2681 Unsteadiness on feet: Secondary | ICD-10-CM | POA: Diagnosis not present

## 2021-09-09 DIAGNOSIS — R278 Other lack of coordination: Secondary | ICD-10-CM | POA: Diagnosis not present

## 2021-09-09 DIAGNOSIS — N1832 Chronic kidney disease, stage 3b: Secondary | ICD-10-CM | POA: Diagnosis not present

## 2021-09-09 DIAGNOSIS — M6281 Muscle weakness (generalized): Secondary | ICD-10-CM | POA: Diagnosis not present

## 2021-09-09 DIAGNOSIS — R279 Unspecified lack of coordination: Secondary | ICD-10-CM | POA: Diagnosis not present

## 2021-09-09 DIAGNOSIS — M6282 Rhabdomyolysis: Secondary | ICD-10-CM | POA: Diagnosis not present

## 2021-09-09 DIAGNOSIS — Z9181 History of falling: Secondary | ICD-10-CM | POA: Diagnosis not present

## 2021-09-12 DIAGNOSIS — R2681 Unsteadiness on feet: Secondary | ICD-10-CM | POA: Diagnosis not present

## 2021-09-12 DIAGNOSIS — G9341 Metabolic encephalopathy: Secondary | ICD-10-CM | POA: Diagnosis not present

## 2021-09-12 DIAGNOSIS — N1832 Chronic kidney disease, stage 3b: Secondary | ICD-10-CM | POA: Diagnosis not present

## 2021-09-12 DIAGNOSIS — M6282 Rhabdomyolysis: Secondary | ICD-10-CM | POA: Diagnosis not present

## 2021-09-12 DIAGNOSIS — R278 Other lack of coordination: Secondary | ICD-10-CM | POA: Diagnosis not present

## 2021-09-12 DIAGNOSIS — R279 Unspecified lack of coordination: Secondary | ICD-10-CM | POA: Diagnosis not present

## 2021-09-12 DIAGNOSIS — Z9181 History of falling: Secondary | ICD-10-CM | POA: Diagnosis not present

## 2021-09-12 DIAGNOSIS — M6281 Muscle weakness (generalized): Secondary | ICD-10-CM | POA: Diagnosis not present

## 2021-11-08 ENCOUNTER — Emergency Department (HOSPITAL_COMMUNITY)
Admission: EM | Admit: 2021-11-08 | Discharge: 2021-11-09 | Disposition: A | Discharge status: Home / Self Care | Payer: Medicare PPO | Attending: Emergency Medicine | Admitting: Emergency Medicine

## 2021-11-08 ENCOUNTER — Emergency Department (HOSPITAL_COMMUNITY): Payer: Medicare PPO

## 2021-11-08 ENCOUNTER — Other Ambulatory Visit: Payer: Self-pay

## 2021-11-08 ENCOUNTER — Encounter (HOSPITAL_COMMUNITY): Payer: Self-pay

## 2021-11-08 DIAGNOSIS — J90 Pleural effusion, not elsewhere classified: Secondary | ICD-10-CM | POA: Diagnosis not present

## 2021-11-08 DIAGNOSIS — R0602 Shortness of breath: Secondary | ICD-10-CM | POA: Diagnosis not present

## 2021-11-08 DIAGNOSIS — I110 Hypertensive heart disease with heart failure: Secondary | ICD-10-CM | POA: Diagnosis not present

## 2021-11-08 DIAGNOSIS — I509 Heart failure, unspecified: Secondary | ICD-10-CM | POA: Insufficient documentation

## 2021-11-08 DIAGNOSIS — R609 Edema, unspecified: Secondary | ICD-10-CM | POA: Diagnosis not present

## 2021-11-08 DIAGNOSIS — J939 Pneumothorax, unspecified: Secondary | ICD-10-CM | POA: Diagnosis not present

## 2021-11-08 DIAGNOSIS — F0390 Unspecified dementia without behavioral disturbance: Secondary | ICD-10-CM | POA: Insufficient documentation

## 2021-11-08 DIAGNOSIS — M7989 Other specified soft tissue disorders: Secondary | ICD-10-CM | POA: Diagnosis present

## 2021-11-08 DIAGNOSIS — N189 Chronic kidney disease, unspecified: Secondary | ICD-10-CM | POA: Insufficient documentation

## 2021-11-08 LAB — COMPREHENSIVE METABOLIC PANEL
ALT: 36 U/L (ref 0–44)
AST: 33 U/L (ref 15–41)
Albumin: 3.6 g/dL (ref 3.5–5.0)
Alkaline Phosphatase: 66 U/L (ref 38–126)
Anion gap: 6 (ref 5–15)
BUN: 22 mg/dL (ref 8–23)
CO2: 27 mmol/L (ref 22–32)
Calcium: 8.8 mg/dL — ABNORMAL LOW (ref 8.9–10.3)
Chloride: 110 mmol/L (ref 98–111)
Creatinine, Ser: 1.44 mg/dL — ABNORMAL HIGH (ref 0.61–1.24)
GFR, Estimated: 44 mL/min — ABNORMAL LOW (ref >60)
Glucose, Bld: 108 mg/dL — ABNORMAL HIGH (ref 70–99)
Potassium: 4.5 mmol/L (ref 3.5–5.1)
Sodium: 143 mmol/L (ref 135–145)
Total Bilirubin: 0.8 mg/dL (ref 0.3–1.2)
Total Protein: 6.2 g/dL — ABNORMAL LOW (ref 6.5–8.1)

## 2021-11-08 LAB — CBC
HCT: 38.6 % — ABNORMAL LOW (ref 39.0–52.0)
Hemoglobin: 12 g/dL — ABNORMAL LOW (ref 13.0–17.0)
MCH: 31.1 pg (ref 26.0–34.0)
MCHC: 31.1 g/dL (ref 30.0–36.0)
MCV: 100 fL (ref 80.0–100.0)
Platelets: 128 10*3/uL — ABNORMAL LOW (ref 150–400)
RBC: 3.86 MIL/uL — ABNORMAL LOW (ref 4.22–5.81)
RDW: 13.2 % (ref 11.5–15.5)
WBC: 2.9 10*3/uL — ABNORMAL LOW (ref 4.0–10.5)
nRBC: 0 % (ref 0.0–0.2)

## 2021-11-08 LAB — MAGNESIUM: Magnesium: 2.1 mg/dL (ref 1.7–2.4)

## 2021-11-08 LAB — BRAIN NATRIURETIC PEPTIDE: B Natriuretic Peptide: 918.2 pg/mL — ABNORMAL HIGH (ref 0.0–100.0)

## 2021-11-08 MED ORDER — FUROSEMIDE 10 MG/ML IJ SOLN
40.0000 mg | Freq: Once | INTRAMUSCULAR | Status: AC
  Administered 2021-11-08: 40 mg via INTRAVENOUS
  Filled 2021-11-08: qty 4

## 2021-11-08 MED ORDER — FUROSEMIDE 40 MG PO TABS: 40.0000 mg | ORAL_TABLET | Freq: Every day | ORAL | 0 refills | Status: DC

## 2021-11-08 NOTE — ED Provider Notes (Signed)
[facility]-EMERGENCY DEPT Provider Note   CSN: [id number] Arrival date & time: 11/08/21  1919     History {Add pertinent medical, surgical, social history, OB history to HPI:1} Chief Complaint  Patient presents with   Leg Swelling    John Rose is a 88 y.o. male.  88 year old male with history of dementia, lower extremity edema on Lasix, CKD, thyroid disorder who presents emergency department with lower extremity swelling.  Per the patient's son over the past week or so he has had increased bilateral lower extremity swelling.  Says that he thought he may have been breathing differently last night and with the increased swelling today decided to have him come to the emergency department to be evaluated.  No known history of CHF but does take 20 mg of Lasix daily.  Reports that he has been compliant with his medication and is still making good urine output.  Says that they do not keep track of his fluid intake but try to be cognizant of his sodium intake.  Was hospitalized earlier in the year and had an echo on 08/2021 that showed grade 2 diastolic dysfunction with LVH but normal EF.  Denies any recent falls, chest pain, cough, or SOB otherwise.         Home Medications Prior to Admission medications   Medication Sig Start Date End Date Taking? Authorizing Provider  acetaminophen (TYLENOL) 650 MG CR tablet Take 650 mg by mouth every 8 (eight) hours as needed for pain.    [provider]  ANDROGEL PUMP 20.25 MG/ACT (1.62%) GEL Apply 1 Dose topically daily. 08/09/21   Glade Lloyd, MD  furosemide (LASIX) 40 MG tablet Take 20 mg by mouth daily as needed for fluid.    [provider]  leptospermum manuka honey (MEDIHONEY) PSTE paste Apply 1 application. topically daily. Apply to right lateral lower leg wound (just below the knee) and cover with a foam dressing. Apply thin layer (3 mm) to wound. 08/10/21   Glade Lloyd, MD  tamsulosin (FLOMAX) 0.4 MG  CAPS capsule Take 0.4 mg by mouth daily.  08/16/16   [provider]  vitamin B-12 1000 MCG tablet Take 1 tablet (1,000 mcg total) by mouth daily. 08/10/21   Glade Lloyd, MD      Allergies    Patient has no known allergies.    Review of Systems   Review of Systems  Physical Exam Updated Vital Signs BP 134/86 (BP Location: Left Arm)   Pulse 81   Temp 98.5 F (36.9 C) (Oral)   Resp 16   SpO2 96%  Physical Exam Vitals and nursing note reviewed.  Constitutional:      General: He is not in acute distress.    Appearance: He is well-developed.     Comments: Alert and oriented to person, year, and the fact that he was in a healthcare facility.  Appears to be at baseline after conversation with the patient's son  HENT:     Head: Normocephalic and atraumatic.     Right Ear: External ear normal.     Left Ear: External ear normal.     Nose: Nose normal.  Eyes:     Extraocular Movements: Extraocular movements intact.     Conjunctiva/sclera: Conjunctivae normal.     Pupils: Pupils are equal, round, and reactive to light.  Cardiovascular:     Rate and Rhythm: Normal rate and regular rhythm.     Heart sounds: Normal heart sounds.  Pulmonary:  Effort: Pulmonary effort is normal. No respiratory distress.     Breath sounds: Normal breath sounds.  Abdominal:     General: There is no distension.     Palpations: Abdomen is soft. There is no mass.     Tenderness: There is no abdominal tenderness. There is no guarding.  Musculoskeletal:        General: No swelling.     Cervical back: Normal range of motion and neck supple.     Right lower leg: Edema (2+) present.     Left lower leg: Edema (2+) present.  Skin:    General: Skin is warm and dry.     Capillary Refill: Capillary refill takes less than 2 seconds.  Neurological:     Mental Status: He is alert. Mental status is at baseline.     Comments: Moving all 4 extremities equally  Psychiatric:        Mood and Affect: Mood  normal.        Behavior: Behavior normal.     ED Results / Procedures / Treatments   Labs (all labs ordered are listed, but only abnormal results are displayed) Labs Reviewed  MAGNESIUM  BRAIN NATRIURETIC PEPTIDE  CBC  COMPREHENSIVE METABOLIC PANEL    EKG None  Radiology No results found.  Procedures Procedures  {Document cardiac monitor, telemetry assessment procedure when appropriate:1}  Medications Ordered in ED Medications - No data to display  ED Course/ Medical Decision Making/ A&P                           Medical Decision Making Amount and/or Complexity of Data Reviewed Labs: ordered. Radiology: ordered.   ***  {Document critical care time when appropriate:1} {Document review of labs and clinical decision tools ie heart score, Chads2Vasc2 etc:1}  {Document your independent review of radiology images, and any outside records:1} {Document your discussion with family members, caretakers, and with consultants:1} {Document social determinants of health affecting pt's care:1} {Document your decision making why or why not admission, treatments were needed:1} Final Clinical Impression(s) / ED Diagnoses Final diagnoses:  None    Rx / DC Orders ED Discharge Orders     None

## 2021-11-08 NOTE — ED Triage Notes (Addendum)
Pt BIB EMS with reports of swelling in his lower legs for the past 2 months. Pt's family wants him to be evaluated. Dementia per baseline.

## 2021-11-08 NOTE — Discharge Instructions (Addendum)
Today you were seen in the emergency department for your leg swelling.    In the emergency department you were give IV lasix.    At home, please take 40 mg of oral lasix (two 20 mg pills) per day for the next 5 days.    Follow-up with your primary doctor in 3 days regarding your visit.   Return immediately to the emergency department if you experience any of the following: Fevers, productive cough, difficulty breathing, or any other concerning symptoms.    Thank you for visiting our Emergency Department. It was a pleasure taking care of you today.

## 2021-11-14 DIAGNOSIS — I5022 Chronic systolic (congestive) heart failure: Secondary | ICD-10-CM | POA: Diagnosis not present

## 2021-11-14 DIAGNOSIS — I499 Cardiac arrhythmia, unspecified: Secondary | ICD-10-CM | POA: Diagnosis not present

## 2021-11-14 DIAGNOSIS — N1832 Chronic kidney disease, stage 3b: Secondary | ICD-10-CM | POA: Diagnosis not present

## 2021-11-14 DIAGNOSIS — D508 Other iron deficiency anemias: Secondary | ICD-10-CM | POA: Diagnosis not present

## 2021-11-15 DIAGNOSIS — R609 Edema, unspecified: Secondary | ICD-10-CM | POA: Diagnosis not present

## 2021-11-15 DIAGNOSIS — I509 Heart failure, unspecified: Secondary | ICD-10-CM | POA: Diagnosis not present

## 2021-11-15 DIAGNOSIS — I5022 Chronic systolic (congestive) heart failure: Secondary | ICD-10-CM | POA: Diagnosis not present

## 2021-11-15 DIAGNOSIS — N1832 Chronic kidney disease, stage 3b: Secondary | ICD-10-CM | POA: Diagnosis not present

## 2021-12-01 DIAGNOSIS — R54 Age-related physical debility: Secondary | ICD-10-CM | POA: Diagnosis not present

## 2021-12-01 DIAGNOSIS — M47816 Spondylosis without myelopathy or radiculopathy, lumbar region: Secondary | ICD-10-CM | POA: Diagnosis not present

## 2021-12-01 DIAGNOSIS — M159 Polyosteoarthritis, unspecified: Secondary | ICD-10-CM | POA: Diagnosis not present

## 2021-12-01 DIAGNOSIS — F411 Generalized anxiety disorder: Secondary | ICD-10-CM | POA: Diagnosis not present

## 2021-12-01 DIAGNOSIS — I5022 Chronic systolic (congestive) heart failure: Secondary | ICD-10-CM | POA: Diagnosis not present

## 2021-12-01 DIAGNOSIS — N400 Enlarged prostate without lower urinary tract symptoms: Secondary | ICD-10-CM | POA: Diagnosis not present

## 2021-12-01 DIAGNOSIS — D638 Anemia in other chronic diseases classified elsewhere: Secondary | ICD-10-CM | POA: Diagnosis not present

## 2021-12-02 DIAGNOSIS — M25552 Pain in left hip: Secondary | ICD-10-CM | POA: Diagnosis not present

## 2021-12-02 DIAGNOSIS — W19XXXA Unspecified fall, initial encounter: Secondary | ICD-10-CM | POA: Diagnosis not present

## 2021-12-02 DIAGNOSIS — M1612 Unilateral primary osteoarthritis, left hip: Secondary | ICD-10-CM | POA: Diagnosis not present

## 2021-12-09 DIAGNOSIS — Z9181 History of falling: Secondary | ICD-10-CM | POA: Diagnosis not present

## 2021-12-09 DIAGNOSIS — M159 Polyosteoarthritis, unspecified: Secondary | ICD-10-CM | POA: Diagnosis not present

## 2021-12-09 DIAGNOSIS — R54 Age-related physical debility: Secondary | ICD-10-CM | POA: Diagnosis not present

## 2021-12-09 DIAGNOSIS — S81802D Unspecified open wound, left lower leg, subsequent encounter: Secondary | ICD-10-CM | POA: Diagnosis not present

## 2021-12-09 DIAGNOSIS — I5022 Chronic systolic (congestive) heart failure: Secondary | ICD-10-CM | POA: Diagnosis not present

## 2021-12-09 DIAGNOSIS — N390 Urinary tract infection, site not specified: Secondary | ICD-10-CM | POA: Diagnosis not present

## 2021-12-14 ENCOUNTER — Encounter: Payer: Self-pay | Admitting: Internal Medicine

## 2021-12-14 ENCOUNTER — Ambulatory Visit: Payer: Medicare PPO | Attending: Internal Medicine | Admitting: Internal Medicine

## 2021-12-14 VITALS — BP 112/60 | HR 80 | Ht 68.0 in | Wt 174.0 lb

## 2021-12-14 DIAGNOSIS — R600 Localized edema: Secondary | ICD-10-CM | POA: Insufficient documentation

## 2021-12-14 DIAGNOSIS — N390 Urinary tract infection, site not specified: Secondary | ICD-10-CM | POA: Insufficient documentation

## 2021-12-14 DIAGNOSIS — I5033 Acute on chronic diastolic (congestive) heart failure: Secondary | ICD-10-CM

## 2021-12-14 MED ORDER — POTASSIUM CHLORIDE CRYS ER 20 MEQ PO TBCR: 20.0000 meq | EXTENDED_RELEASE_TABLET | Freq: Every day | ORAL | 3 refills | Status: DC

## 2021-12-14 MED ORDER — FUROSEMIDE 40 MG PO TABS: 40.0000 mg | ORAL_TABLET | Freq: Every day | ORAL | 3 refills | Status: DC

## 2021-12-14 NOTE — Patient Instructions (Signed)
Medication Instructions:  Your physician has recommended you make the following change in your medication:  INCREASE: furosemide (Lasix) to 40 mg by mouth once daily START: Potassium 20 mEq by mouth once daily  *If you need a refill on your cardiac medications before your next appointment, please call your pharmacy*   Lab Work: TODAY: CMP, BNP IN 1 WEEK with Home Health: BMP, Mg  If you have labs (blood work) drawn today and your tests are completely normal, you will receive your results only by: MyChart Message (if you have MyChart) OR A paper copy in the mail If you have any lab test that is abnormal or we need to change your treatment, we will call you to review the results.   Testing/Procedures: NONE   Follow-Up: At [facility], you and your health needs are our priority.  As part of our continuing mission to provide you with exceptional heart care, we have created designated Provider Care Teams.  These Care Teams include your primary Cardiologist (physician) and Advanced Practice Providers (APPs -  Physician Assistants and Nurse Practitioners) who all work together to provide you with the care you need, when you need it.  We recommend signing up for the patient portal called "MyChart".  Sign up information is provided on this After Visit Summary.  MyChart is used to connect with patients for Virtual Visits (Telemedicine).  Patients are able to view lab/test results, encounter notes, upcoming appointments, etc.  Non-urgent messages can be sent to your provider as well.   To learn more about what you can do with MyChart, go to [email or web address].    Your next appointment:   3 month(s)  The format for your next appointment:   In Person  Provider:   Ronie Spies, PA-C, Jacolyn Reedy, PA-C, or Eligha Bridegroom, NP        Important Information About Sugar

## 2021-12-14 NOTE — Progress Notes (Signed)
Cardiology Office Note:    Date:  12/14/2021   ID:  John Rose, DOB [birth year], MRN [id number]  PCP:  [facility]   [facility] Providers Cardiologist:  None     Referring MD: John Baton, MD   CC: Walking too much Consulted for the evaluation of irregular heart beats at the behest of Dr. Eloise Rose  History of Present Illness:    John Rose is a 88 y.o. male with a hx of Dementia  CKD stage IIIb, and HF who was found to have PACs.  Patient notes that he is feeling good. He notes that he sees a lot of people that tell him to walk.  When he walks he feels fatigue.  Occasional has exertional feeling in his heart.  Son notes that he was having wheezing the night before.  He was having new leg swelling.  Given 5 days of 40 mg lasix.  Sees a Chief Strategy Officer.  May 4th was found on the floor by his son.  (See Rhabdo note ED).  Went to nursing center for 30 days.  Now lives with son and with 8 hours of care.  John Rose notes that he has diagnosed him in the past with metabolic encephalopathy and communication disorder but no history of dementia.  [facility] has been checking him for UTI and started him on therapy (doxycycline).  Has had no chest pain, chest pressure, chest tightness, chest stinging. No shortness of breath, DOE occurs with excessive walking.  No PND or orthopnea.  No weight gain, or abdominal swelling.  No syncope or near syncope . Notes no palpitations or funny heart beats.     Past Medical History:  Diagnosis Date   Lumbar spondylosis 11/10/2016   Testosterone deficiency     Past Surgical History:  Procedure Laterality Date   NO PAST SURGERIES      Current Medications: Current Meds  Medication Sig   acetaminophen (TYLENOL) 650 MG CR tablet Take 650 mg by mouth every 8 (eight) hours as needed for pain.   ANDROGEL PUMP 20.25 MG/ACT (1.62%) GEL Apply 1 Dose topically daily.   doxycycline (VIBRAMYCIN) 100 MG capsule Take 100 mg by mouth 2  (two) times daily.   furosemide (LASIX) 40 MG tablet Take 1 tablet (40 mg total) by mouth daily.   gabapentin (NEURONTIN) 300 MG capsule Take 300 mg by mouth 2 (two) times daily.   leptospermum manuka honey (MEDIHONEY) PSTE paste Apply 1 application. topically daily. Apply to right lateral lower leg wound (just below the knee) and cover with a foam dressing. Apply thin layer (3 mm) to wound.   potassium chloride SA (KLOR-CON M) 20 MEQ tablet Take 1 tablet (20 mEq total) by mouth daily.   QUEtiapine (SEROQUEL) 25 MG tablet Take 12.5-25 mg by mouth at bedtime as needed.   tamsulosin (FLOMAX) 0.4 MG CAPS capsule Take 0.4 mg by mouth daily.    vitamin B-12 1000 MCG tablet Take 1 tablet (1,000 mcg total) by mouth daily.   [DISCONTINUED] furosemide (LASIX) 40 MG tablet Take 1 tablet (40 mg total) by mouth daily for 5 days.     Allergies:   Patient has no known allergies.   Social History   Socioeconomic History   Marital status: Single    Spouse name: Not on file   Number of children: 1   Years of education: BS   Highest education level: Not on file  Occupational History   Occupation: Retired  Tobacco Use  Smoking status: Never   Smokeless tobacco: Never  Vaping Use   Vaping Use: Never used  Substance and Sexual Activity   Alcohol use: No   Drug use: No   Sexual activity: Not on file  Other Topics Concern   Not on file  Social History Narrative   Lives    Caffeine use: Tea, soda (pepsi)   Social Determinants of Health   Financial Resource Strain: Not on file  Food Insecurity: Not on file  Transportation Needs: Not on file  Physical Activity: Not on file  Stress: Not on file  Social Connections: Not on file     Family History: The patient's family history includes Hypertension in an other family member.  ROS:   Please see the history of present illness.     All other systems reviewed and are negative.  EKGs/Labs/Other Studies Reviewed:    The following studies were  reviewed today:   EKG:   11/08/21: SR 74 one PAC  ECHO COMPLETE WO IMAGING ENHANCING AGENT 08/05/2021  Narrative ECHOCARDIOGRAM REPORT    Patient Name:   John Rose Date of Exam: 08/05/2021 Medical Rec #:  [id number]      Height:       68.0 in Accession #:    [id number]     Weight:       157.4 lb Date of Birth:  10-Dec-1922       BSA:          1.846 m Patient Age:    78 years       BP:           138/75 mmHg Patient Gender: M              HR:           90 bpm. Exam Location:  Inpatient  Procedure: 2D Echo, Cardiac Doppler and Color Doppler  Indications:    Elevated troponin  History:        Patient has no prior history of Echocardiogram examinations.  Sonographer:    John Rose Referring Phys: [id number] John Rose   Sonographer Comments: Suboptimal parasternal window. IMPRESSIONS   1. Left ventricular ejection fraction, by estimation, is 60 to 65%. The left ventricle has normal function. The left ventricle has no regional wall motion abnormalities. There is severe left ventricular hypertrophy. Left ventricular diastolic parameters are consistent with Grade II diastolic dysfunction (pseudonormalization). 2. Right ventricular systolic function is normal. The right ventricular size is normal. There is mildly elevated pulmonary artery systolic pressure. 3. Left atrial size was moderately dilated. 4. Right atrial size was moderately dilated. 5. The mitral valve is grossly normal. No evidence of mitral valve regurgitation. 6. The aortic valve is calcified. Aortic valve regurgitation is not visualized. Aortic valve sclerosis/calcification is present, without any evidence of aortic stenosis. 7. The inferior vena cava is normal in size with greater than 50% respiratory variability, suggesting right atrial pressure of 3 mmHg.  Comparison(s): No prior Echocardiogram.  FINDINGS Left Ventricle: Left ventricular ejection fraction, by estimation, is 60 to 65%. The left ventricle  has normal function. The left ventricle has no regional wall motion abnormalities. The left ventricular internal cavity size was normal in size. There is severe left ventricular hypertrophy. Left ventricular diastolic parameters are consistent with Grade II diastolic dysfunction (pseudonormalization).  Right Ventricle: The right ventricular size is normal. No increase in right ventricular wall thickness. Right ventricular systolic function is normal. There is mildly elevated pulmonary artery systolic pressure.  The tricuspid regurgitant velocity is 2.92 m/s, and with an assumed right atrial pressure of 3 mmHg, the estimated right ventricular systolic pressure is 37.1 mmHg.  Left Atrium: Left atrial size was moderately dilated.  Right Atrium: Right atrial size was moderately dilated.  Pericardium: Trivial pericardial effusion is present.  Mitral Valve: The mitral valve is grossly normal. Mild mitral annular calcification. No evidence of mitral valve regurgitation.  Tricuspid Valve: The tricuspid valve is normal in structure. Tricuspid valve regurgitation is mild.  Aortic Valve: The aortic valve is calcified. Aortic valve regurgitation is not visualized. Aortic valve sclerosis/calcification is present, without any evidence of aortic stenosis. Aortic valve peak gradient measures 9.0 mmHg.  Pulmonic Valve: The pulmonic valve was normal in structure. Pulmonic valve regurgitation is not visualized.  Aorta: The aortic root is normal in size and structure.  Venous: The inferior vena cava is normal in size with greater than 50% respiratory variability, suggesting right atrial pressure of 3 mmHg.  IAS/Shunts: No atrial level shunt detected by color flow Doppler.   LEFT VENTRICLE PLAX 2D LVIDd:         3.80 cm     Diastology LVIDs:         2.50 cm     LV e' medial:    5.17 cm/s LV PW:         1.40 cm     LV E/e' medial:  17.1 LV IVS:        1.00 cm     LV e' lateral:   5.08 cm/s LVOT diam:      2.00 cm     LV E/e' lateral: 17.4 LV SV:         55 LV SV Index:   30 LVOT Area:     3.14 cm  LV Volumes (MOD) LV vol d, MOD A2C: 88.0 ml LV vol d, MOD A4C: 65.5 ml LV vol s, MOD A2C: 34.1 ml LV vol s, MOD A4C: 24.2 ml LV SV MOD A2C:     53.9 ml LV SV MOD A4C:     65.5 ml LV SV MOD BP:      50.1 ml  RIGHT VENTRICLE             IVC RV Basal diam:  3.50 cm     IVC diam: 1.80 cm RV S prime:     14.30 cm/s TAPSE (M-mode): 2.1 cm  LEFT ATRIUM             Index        RIGHT ATRIUM           Index LA diam:        3.90 cm 2.11 cm/m   RA Area:     15.80 cm LA Vol (A2C):   40.0 ml 21.67 ml/m  RA Volume:   41.90 ml  22.70 ml/m LA Vol (A4C):   41.8 ml 22.64 ml/m LA Biplane Vol: 42.5 ml 23.02 ml/m AORTIC VALVE AV Area (Vmax): 2.03 cm AV Vmax:        150.00 cm/s AV Peak Grad:   9.0 mmHg LVOT Vmax:      96.70 cm/s LVOT Vmean:     69.800 cm/s LVOT VTI:       0.174 m  AORTA Ao Root diam: 3.60 cm  MITRAL VALVE               TRICUSPID VALVE MV Area (PHT): 3.53 cm    TR Peak grad:   34.1  mmHg MV Decel Time: 215 msec    TR Vmax:        292.00 cm/s MV E velocity: 88.60 cm/s MV A velocity: 75.80 cm/s  SHUNTS MV E/A ratio:  1.17        Systemic VTI:  0.17 m Systemic Diam: 2.00 cm  Phineas Inches Electronically signed by Phineas Inches Signature Date/Time: 08/05/2021/9:10:11 AM    Final     Recent Labs: 11/08/2021: ALT 36; B Natriuretic Peptide 918.2; BUN 22; Creatinine, Ser 1.44; Hemoglobin 12.0; Magnesium 2.1; Platelets 128; Potassium 4.5; Sodium 143  Recent Lipid Panel No results found for: "CHOL", "TRIG", "HDL", "CHOLHDL", "VLDL", "LDLCALC", "LDLDIRECT"      Physical Exam:    VS:  BP 112/60   Pulse 80   Ht 5\' 8"  (1.727 m)   Wt 174 lb (78.9 kg)   SpO2 97%   BMI 26.46 kg/m     Wt Readings from Last 3 Encounters:  12/14/21 174 lb (78.9 kg)  11/08/21 157 lb (71.2 kg)  08/04/21 157 lb 6.5 oz (71.4 kg)     Gen: No distress, elderly male   Neck: No JVD Cardiac: No Rubs  or Gallops, no Murmur, RRR +2 radial pulses Respiratory: Clear to auscultation bilaterally, normal effort, normal  respiratory rate GI: Soft, nontender, non-distended  MS: R leg +2 pitting edema, has well wrapped lesions on left LE with +2 edema moves all extremities Neuro:  At time of evaluation, alert and oriented to person and place, is originally near [place]: Normal affect, patient feels well   ASSESSMENT:    1. Acute on chronic heart failure with preserved ejection fraction ([place])   2. Bilateral lower extremity edema   3. Urinary tract infection without hematuria, site unspecified    PLAN:    HFPEF Dementia CKD stage IIIB Recent UTI - will plan for over all conservative therapy - will get BNP and CMP (albumin eval as well) - noted muscle pain on higher diuretic - will get lasix 40 mg with 20 meq potassium - BMP and Mg in follow up in one week with his [facility] - No SGLTI with UTI - he is working with son on low salt diet - may need low dose MRA for K support - Unclear GOC at this time; presently Full Code  PACs - rare and asymptomatic will not start additional therapy  APP in 3-4 months         Medication Adjustments/Labs and Tests Ordered: Current medicines are reviewed at length with the patient today.  Concerns regarding medicines are outlined above.  Orders Placed This Encounter  Procedures   Basic metabolic panel   Pro b natriuretic peptide (BNP)   Comprehensive metabolic panel   Magnesium   Meds ordered this encounter  Medications   furosemide (LASIX) 40 MG tablet    Sig: Take 1 tablet (40 mg total) by mouth daily.    Dispense:  90 tablet    Refill:  3   potassium chloride SA (KLOR-CON M) 20 MEQ tablet    Sig: Take 1 tablet (20 mEq total) by mouth daily.    Dispense:  90 tablet    Refill:  3    Patient Instructions  Medication Instructions:  Your physician has recommended you make the following change in your medication:   INCREASE: furosemide (Lasix) to 40 mg by mouth once daily START: Potassium 20 mEq by mouth once daily  *If you need a refill on your cardiac medications before  your next appointment, please call your pharmacy*   Lab Work: TODAY: CMP, BNP IN 1 WEEK with Home Health: BMP, Mg  If you have labs (blood work) drawn today and your tests are completely normal, you will receive your results only by: [place] (if you have MyChart) OR A paper copy in the mail If you have any lab test that is abnormal or we need to change your treatment, we will call you to review the results.   Testing/Procedures: NONE   Follow-Up: At [facility], you and your health needs are our priority.  As part of our continuing mission to provide you with exceptional heart care, we have created designated Provider Care Teams.  These Care Teams include your primary Cardiologist (physician) and Advanced Practice Providers (APPs -  Physician Assistants and Nurse Practitioners) who all work together to provide you with the care you need, when you need it.  We recommend signing up for the patient portal called "MyChart".  Sign up information is provided on this After Visit Summary.  MyChart is used to connect with patients for Virtual Visits (Telemedicine).  Patients are able to view lab/test results, encounter notes, upcoming appointments, etc.  Non-urgent messages can be sent to your provider as well.   To learn more about what you can do with MyChart, go to [email or web address].    Your next appointment:   3 month(s)  The format for your next appointment:   In Person  Provider:   Melina Copa, PA-C, Ermalinda Barrios, PA-C, or Christen Bame, NP        Important Information About Sugar         Signed, Werner Lean, MD  12/14/2021 2:26 PM    [place]

## 2021-12-15 LAB — BASIC METABOLIC PANEL
BUN/Creatinine Ratio: 16 (ref 10–24)
BUN: 24 mg/dL (ref 10–36)
CO2: 23 mmol/L (ref 20–29)
Calcium: 9.5 mg/dL (ref 8.6–10.2)
Chloride: 102 mmol/L (ref 96–106)
Creatinine, Ser: 1.5 mg/dL — ABNORMAL HIGH (ref 0.76–1.27)
Glucose: 81 mg/dL (ref 70–99)
Potassium: 4.7 mmol/L (ref 3.5–5.2)
Sodium: 140 mmol/L (ref 134–144)
eGFR: 42 mL/min/{1.73_m2} — ABNORMAL LOW (ref >59)

## 2021-12-15 LAB — SPECIMEN STATUS REPORT

## 2021-12-16 ENCOUNTER — Emergency Department (HOSPITAL_COMMUNITY)
Admission: EM | Admit: 2021-12-16 | Discharge: 2021-12-16 | Disposition: A | Discharge status: Home / Self Care | Payer: Medicare PPO | Attending: Emergency Medicine | Admitting: Emergency Medicine

## 2021-12-16 ENCOUNTER — Emergency Department (HOSPITAL_COMMUNITY): Payer: Medicare PPO

## 2021-12-16 ENCOUNTER — Encounter (HOSPITAL_COMMUNITY): Payer: Self-pay

## 2021-12-16 ENCOUNTER — Other Ambulatory Visit: Payer: Self-pay

## 2021-12-16 DIAGNOSIS — Z043 Encounter for examination and observation following other accident: Secondary | ICD-10-CM | POA: Diagnosis not present

## 2021-12-16 DIAGNOSIS — R0902 Hypoxemia: Secondary | ICD-10-CM | POA: Diagnosis not present

## 2021-12-16 DIAGNOSIS — M4319 Spondylolisthesis, multiple sites in spine: Secondary | ICD-10-CM | POA: Diagnosis not present

## 2021-12-16 DIAGNOSIS — I130 Hypertensive heart and chronic kidney disease with heart failure and stage 1 through stage 4 chronic kidney disease, or unspecified chronic kidney disease: Secondary | ICD-10-CM | POA: Diagnosis not present

## 2021-12-16 DIAGNOSIS — F0390 Unspecified dementia without behavioral disturbance: Secondary | ICD-10-CM | POA: Insufficient documentation

## 2021-12-16 DIAGNOSIS — W19XXXA Unspecified fall, initial encounter: Secondary | ICD-10-CM

## 2021-12-16 DIAGNOSIS — N189 Chronic kidney disease, unspecified: Secondary | ICD-10-CM | POA: Diagnosis not present

## 2021-12-16 DIAGNOSIS — I10 Essential (primary) hypertension: Secondary | ICD-10-CM | POA: Diagnosis not present

## 2021-12-16 DIAGNOSIS — J9811 Atelectasis: Secondary | ICD-10-CM | POA: Diagnosis not present

## 2021-12-16 DIAGNOSIS — I509 Heart failure, unspecified: Secondary | ICD-10-CM | POA: Diagnosis not present

## 2021-12-16 DIAGNOSIS — Z79899 Other long term (current) drug therapy: Secondary | ICD-10-CM | POA: Insufficient documentation

## 2021-12-16 DIAGNOSIS — M25551 Pain in right hip: Secondary | ICD-10-CM | POA: Diagnosis not present

## 2021-12-16 DIAGNOSIS — R4182 Altered mental status, unspecified: Secondary | ICD-10-CM | POA: Diagnosis not present

## 2021-12-16 DIAGNOSIS — J90 Pleural effusion, not elsewhere classified: Secondary | ICD-10-CM | POA: Diagnosis not present

## 2021-12-16 LAB — CBC WITH DIFFERENTIAL/PLATELET
Abs Immature Granulocytes: 0.01 10*3/uL (ref 0.00–0.07)
Basophils Absolute: 0 10*3/uL (ref 0.0–0.1)
Basophils Relative: 1 %
Eosinophils Absolute: 0 10*3/uL (ref 0.0–0.5)
Eosinophils Relative: 1 %
HCT: 40.1 % (ref 39.0–52.0)
Hemoglobin: 12.8 g/dL — ABNORMAL LOW (ref 13.0–17.0)
Immature Granulocytes: 0 %
Lymphocytes Relative: 30 %
Lymphs Abs: 0.9 10*3/uL (ref 0.7–4.0)
MCH: 31.5 pg (ref 26.0–34.0)
MCHC: 31.9 g/dL (ref 30.0–36.0)
MCV: 98.8 fL (ref 80.0–100.0)
Monocytes Absolute: 0.4 10*3/uL (ref 0.1–1.0)
Monocytes Relative: 12 %
Neutro Abs: 1.7 10*3/uL (ref 1.7–7.7)
Neutrophils Relative %: 56 %
Platelets: 132 10*3/uL — ABNORMAL LOW (ref 150–400)
RBC: 4.06 MIL/uL — ABNORMAL LOW (ref 4.22–5.81)
RDW: 13.2 % (ref 11.5–15.5)
WBC: 3 10*3/uL — ABNORMAL LOW (ref 4.0–10.5)
nRBC: 0 % (ref 0.0–0.2)

## 2021-12-16 LAB — COMPREHENSIVE METABOLIC PANEL
ALT: 21 U/L (ref 0–44)
AST: 25 U/L (ref 15–41)
Albumin: 3.8 g/dL (ref 3.5–5.0)
Alkaline Phosphatase: 52 U/L (ref 38–126)
Anion gap: 6 (ref 5–15)
BUN: 31 mg/dL — ABNORMAL HIGH (ref 8–23)
CO2: 25 mmol/L (ref 22–32)
Calcium: 9.2 mg/dL (ref 8.9–10.3)
Chloride: 107 mmol/L (ref 98–111)
Creatinine, Ser: 1.43 mg/dL — ABNORMAL HIGH (ref 0.61–1.24)
GFR, Estimated: 45 mL/min — ABNORMAL LOW (ref >60)
Glucose, Bld: 97 mg/dL (ref 70–99)
Potassium: 4.5 mmol/L (ref 3.5–5.1)
Sodium: 138 mmol/L (ref 135–145)
Total Bilirubin: 0.8 mg/dL (ref 0.3–1.2)
Total Protein: 6.4 g/dL — ABNORMAL LOW (ref 6.5–8.1)

## 2021-12-16 LAB — URINALYSIS, ROUTINE W REFLEX MICROSCOPIC
Bilirubin Urine: NEGATIVE
Glucose, UA: NEGATIVE mg/dL
Ketones, ur: NEGATIVE mg/dL
Leukocytes,Ua: NEGATIVE
Nitrite: NEGATIVE
Protein, ur: NEGATIVE mg/dL
Specific Gravity, Urine: 1.019 (ref 1.005–1.030)
pH: 5 (ref 5.0–8.0)

## 2021-12-16 LAB — LACTIC ACID, PLASMA: Lactic Acid, Venous: 1.1 mmol/L (ref 0.5–1.9)

## 2021-12-16 LAB — CK: Total CK: 143 U/L (ref 49–397)

## 2021-12-16 LAB — CBG MONITORING, ED: Glucose-Capillary: 98 mg/dL (ref 70–99)

## 2021-12-16 MED ORDER — SODIUM CHLORIDE 0.9 % IV BOLUS
1000.0000 mL | Freq: Once | INTRAVENOUS | Status: AC
  Administered 2021-12-16: 1000 mL via INTRAVENOUS

## 2021-12-16 NOTE — ED Provider Notes (Signed)
[facility]-EMERGENCY DEPT Provider Note   CSN: [id number] Arrival date & time: 12/16/21  0859     History  Chief Complaint  Patient presents with   Fall    John Rose is a 88 y.o. male.  Pt is a 88 yo male with a pmhx significant for dementia, ckd 3b, arthritis and CHF.  Pt lives with his son (son moved here in June to live with him) and was found on the ground this am by his son.  He is unable to give [place] any hx.  Pt said he feels like he is in misery, but has no specific complaints.  He denies sob. It is unclear how long he was on the ground.  I attempted to call his son, but he did not pick up the phone.  His son did call back.  Son said he heard him around 0230 or 0300 in the morning in the bathroom.  Today, the home health worker came in around 0800 and found him by the door laying on the linoleum.  Son did not hear him fall.  Pt was still wearing his clothes from last night and son does not think he ever changed into bed clothes.  His son said his cardiologist changed his lasix from 20 to 66 on the 13th.  No other recent med changes. Son said he walks with a walker, but not well.       Home Medications Prior to Admission medications   Medication Sig Start Date End Date Taking? Authorizing Provider  acetaminophen (TYLENOL) 650 MG CR tablet Take 650 mg by mouth every 8 (eight) hours as needed for pain.    [provider]  ANDROGEL PUMP 20.25 MG/ACT (1.62%) GEL Apply 1 Dose topically daily. 08/09/21   Glade Lloyd, MD  doxycycline (VIBRAMYCIN) 100 MG capsule Take 100 mg by mouth 2 (two) times daily. 12/08/21   [provider]  furosemide (LASIX) 40 MG tablet Take 1 tablet (40 mg total) by mouth daily. 12/14/21   Chandrasekhar, [place] Dragon A, MD  gabapentin (NEURONTIN) 300 MG capsule Take 300 mg by mouth 2 (two) times daily. 11/19/21   [provider]  leptospermum manuka honey (MEDIHONEY) PSTE paste Apply 1 application. topically daily.  Apply to right lateral lower leg wound (just below the knee) and cover with a foam dressing. Apply thin layer (3 mm) to wound. 08/10/21   Glade Lloyd, MD  potassium chloride SA (KLOR-CON M) 20 MEQ tablet Take 1 tablet (20 mEq total) by mouth daily. 12/14/21   Chandrasekhar, [place] Dragon A, MD  QUEtiapine (SEROQUEL) 25 MG tablet Take 12.5-25 mg by mouth at bedtime as needed. 11/10/21   [provider]  tamsulosin (FLOMAX) 0.4 MG CAPS capsule Take 0.4 mg by mouth daily.  08/16/16   [provider]  vitamin B-12 1000 MCG tablet Take 1 tablet (1,000 mcg total) by mouth daily. 08/10/21   Glade Lloyd, MD      Allergies    Patient has no known allergies.    Review of Systems   Review of Systems  All other systems reviewed and are negative.   Physical Exam Updated Vital Signs BP (!) 142/94   Pulse 71   Temp (!) 97.4 F (36.3 C) (Oral)   Resp 13   Ht 5\' 8"  (1.727 m)   Wt 78 kg   SpO2 95%   BMI 26.15 kg/m  Physical Exam Vitals and nursing note reviewed.  Constitutional:      Appearance:  Normal appearance.  HENT:     Head: Normocephalic and atraumatic.     Right Ear: External ear normal.     Left Ear: External ear normal.     Nose: Nose normal.     Mouth/Throat:     Mouth: Mucous membranes are dry.  Eyes:     Extraocular Movements: Extraocular movements intact.     Conjunctiva/sclera: Conjunctivae normal.     Pupils: Pupils are equal, round, and reactive to light.  Cardiovascular:     Rate and Rhythm: Normal rate and regular rhythm.     Pulses: Normal pulses.     Heart sounds: Normal heart sounds.  Pulmonary:     Effort: Pulmonary effort is normal.     Breath sounds: Normal breath sounds.  Abdominal:     General: Abdomen is flat. Bowel sounds are normal.     Palpations: Abdomen is soft.  Musculoskeletal:     Cervical back: Normal range of motion and neck supple.     Comments: Pt is moving all 4 extremities, but his extremities all feel stiff.  Skin:     General: Skin is warm.     Capillary Refill: Capillary refill takes less than 2 seconds.  Neurological:     General: No focal deficit present.     Mental Status: He is alert. Mental status is at baseline.  Psychiatric:        Mood and Affect: Mood normal.        Behavior: Behavior normal.     ED Results / Procedures / Treatments   Labs (all labs ordered are listed, but only abnormal results are displayed) Labs Reviewed  CBC WITH DIFFERENTIAL/PLATELET - Abnormal; Notable for the following components:      Result Value   WBC 3.0 (*)    RBC 4.06 (*)    Hemoglobin 12.8 (*)    Platelets 132 (*)    All other components within normal limits  COMPREHENSIVE METABOLIC PANEL - Abnormal; Notable for the following components:   BUN 31 (*)    Creatinine, Ser 1.43 (*)    Total Protein 6.4 (*)    GFR, Estimated 45 (*)    All other components within normal limits  URINALYSIS, ROUTINE W REFLEX MICROSCOPIC - Abnormal; Notable for the following components:   Hgb urine dipstick SMALL (*)    Bacteria, UA RARE (*)    All other components within normal limits  LACTIC ACID, PLASMA  CK  CBG MONITORING, ED    EKG EKG Interpretation  Date/Time:  Friday December 16 2021 09:30:08 EDT Ventricular Rate:  77 PR Interval:  154 QRS Duration: 111 QT Interval:  376 QTC Calculation: 426 R Axis:   -74 Text Interpretation: Unknown rhythm, irregular rate LAD, consider left anterior fascicular block Anteroseptal infarct, old Poor data quality in current ECG precludes serial comparison Confirmed by Isla Pence [phone]) on 12/16/2021 9:33:14 AM  Radiology CT Head Wo Contrast  Result Date: 12/16/2021 CLINICAL DATA:  Fall EXAM: CT HEAD WITHOUT CONTRAST CT CERVICAL SPINE WITHOUT CONTRAST TECHNIQUE: Multidetector CT imaging of the head and cervical spine was performed following the standard protocol without intravenous contrast. Multiplanar CT image reconstructions of the cervical spine were also generated.  RADIATION DOSE REDUCTION: This exam was performed according to the departmental dose-optimization program which includes automated exposure control, adjustment of the mA and/or kV according to patient size and/or use of iterative reconstruction technique. COMPARISON:  MRI brain 08/05/2018, CT chest 08/04/2021 FINDINGS: CT HEAD FINDINGS Brain: No  evidence of acute infarction, hemorrhage, hydrocephalus, extra-axial collection or mass lesion/mass effect. Advanced generalized volume loss. Vascular: No hyperdense vessel or unexpected calcification. Skull: Normal. Negative for fracture or focal lesion. Sinuses/Orbits: No acute finding. Other: Scattered punctate radiodensities along the periorbital scalp bilaterally likely represent dermal calcifications. CT CERVICAL SPINE FINDINGS Alignment: Straightening of cervical lordosis. Grade 1 anterolisthesis of C3 on C4 and trace anterolisthesis of C7 on T1. Skull base and vertebrae: No acute fracture. No primary bone lesion or focal pathologic process. Soft tissues and spinal canal: No prevertebral fluid or swelling. No visible canal hematoma. Disc levels: There are severe multilevel degenerative facet disease with fusion of the facet joints at C3-C4. There is severe disc space loss at C5-C6 with near fusion of the vertebral bodies. Upper chest: Small right-sided pleural effusion. Enlarged and multinodular thyroid gland, unchanged from prior. Other: None IMPRESSION: 1. No CT evidence of intracranial injury. 2. No CT evidence of acute cervical spine fracture. 3. Small right pleural effusion.  No pneumothorax. Electronically Signed   By: Marin Roberts M.D.   On: 12/16/2021 10:11   CT Cervical Spine Wo Contrast  Result Date: 12/16/2021 CLINICAL DATA:  Fall EXAM: CT HEAD WITHOUT CONTRAST CT CERVICAL SPINE WITHOUT CONTRAST TECHNIQUE: Multidetector CT imaging of the head and cervical spine was performed following the standard protocol without intravenous contrast. Multiplanar CT  image reconstructions of the cervical spine were also generated. RADIATION DOSE REDUCTION: This exam was performed according to the departmental dose-optimization program which includes automated exposure control, adjustment of the mA and/or kV according to patient size and/or use of iterative reconstruction technique. COMPARISON:  MRI brain 08/05/2018, CT chest 08/04/2021 FINDINGS: CT HEAD FINDINGS Brain: No evidence of acute infarction, hemorrhage, hydrocephalus, extra-axial collection or mass lesion/mass effect. Advanced generalized volume loss. Vascular: No hyperdense vessel or unexpected calcification. Skull: Normal. Negative for fracture or focal lesion. Sinuses/Orbits: No acute finding. Other: Scattered punctate radiodensities along the periorbital scalp bilaterally likely represent dermal calcifications. CT CERVICAL SPINE FINDINGS Alignment: Straightening of cervical lordosis. Grade 1 anterolisthesis of C3 on C4 and trace anterolisthesis of C7 on T1. Skull base and vertebrae: No acute fracture. No primary bone lesion or focal pathologic process. Soft tissues and spinal canal: No prevertebral fluid or swelling. No visible canal hematoma. Disc levels: There are severe multilevel degenerative facet disease with fusion of the facet joints at C3-C4. There is severe disc space loss at C5-C6 with near fusion of the vertebral bodies. Upper chest: Small right-sided pleural effusion. Enlarged and multinodular thyroid gland, unchanged from prior. Other: None IMPRESSION: 1. No CT evidence of intracranial injury. 2. No CT evidence of acute cervical spine fracture. 3. Small right pleural effusion.  No pneumothorax. Electronically Signed   By: Marin Roberts M.D.   On: 12/16/2021 10:11   DG Chest Portable 1 View  Result Date: 12/16/2021 CLINICAL DATA:  Unwitnessed fall. EXAM: PORTABLE CHEST 1 VIEW COMPARISON:  November 08, 2021. FINDINGS: Stable cardiomediastinal silhouette. Left lung is clear. Right pleural effusion is  noted with associated right basilar atelectasis. Bony thorax is unremarkable. IMPRESSION: Grossly stable right pleural effusion is noted with associated right basilar atelectasis. Electronically Signed   By: Marijo Conception M.D.   On: 12/16/2021 09:47   DG Pelvis Portable  Result Date: 12/16/2021 CLINICAL DATA:  Fall.  Found on floor at home by son this morning. EXAM: PORTABLE PELVIS 1-2 VIEWS COMPARISON:  CT abdomen pelvis 12/26/2016 FINDINGS: There is diffuse decreased bone mineralization. Markedly severe superior left  femoroacetabular joint space narrowing with bone-on-bone contact, subchondral sclerosis, subchondral cystic change, mild-to-moderate superior left femoral head and acetabular cortical flattening/remodeling, and moderate-to-large peripheral degenerative osteophytes. Less severe, but still severe, superomedial right femoroacetabular joint space narrowing. The pubic symphysis joint space is maintained. Moderate bilateral sacroiliac joint space narrowing and subchondral sclerosis. No acute fracture is seen. No dislocation. Vascular phleboliths overlie the pelvis. IMPRESSION: 1. 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 2. No acute fracture is seen. Electronically Signed   By: Neita Garnet M.D.   On: 12/16/2021 09:46    Procedures Procedures    Medications Ordered in ED Medications  sodium chloride 0.9 % bolus 1,000 mL (1,000 mLs Intravenous New Bag/Given 12/16/21 1007)    ED Course/ Medical Decision Making/ A&P                           Medical Decision Making Amount and/or Complexity of Data Reviewed Labs: ordered. Radiology: ordered.   This patient presents to the ED for concern of fall, this involves an extensive number of treatment options, and is a complaint that carries with it a high risk of complications and morbidity.  The differential diagnosis includes syncope, mechanical fall, rhabdo, infection   Co morbidities that complicate the  patient evaluation  Dementia and CHF   Additional history obtained:  Additional history obtained from epic chart review External records from outside source obtained and reviewed including EMS report, son   Lab Tests:  I Ordered, and personally interpreted labs.  The pertinent results include:  cbc with wbc 3.o, hgb 12.8, plt low at 132 (all chronic); cmp with cr 1.43, lactic 1.1, ck 143   Imaging Studies ordered:  I ordered imaging studies including cxr, pelvis, ct head/ct neck  I independently visualized and interpreted imaging which showed  CXR: IMPRESSION:  Grossly stable right pleural effusion is noted with associated right  basilar atelectasis.  Pelvis: IMPRESSION:  1. Markedly severe left and severe right femoroacetabular  osteoarthritis.  2. No acute fracture is seen.  CT head/c-spine: IMPRESSION:  1. No CT evidence of intracranial injury.  2. No CT evidence of acute cervical spine fracture.  3. Small right pleural effusion.  No pneumothorax.   I agree with the radiologist interpretation   Cardiac Monitoring:  The patient was maintained on a cardiac monitor.  I personally viewed and interpreted the cardiac monitored which showed an underlying rhythm of: nsr with PACs (definitely sinus on monitor, not afib)   Medicines ordered and prescription drug management:  I ordered medication including ivfs  for dehydration  Reevaluation of the patient after these medicines showed that the patient improved I have reviewed the patients home medicines and have made adjustments as needed   Test Considered:  ct   Critical Interventions:  ivfs    Problem List / ED Course:  Fall:  No fx or internal injuries.  Pt able to ambulate with his walker without any issues.  CKD 3b:  stable   Reevaluation:  After the interventions noted above, I reevaluated the patient and found that they have :improved   Social Determinants of Health:  Lives with  son   Dispostion:  After consideration of the diagnostic results and the patients response to treatment, I feel that the patent would benefit from discharge with outpatient f/u.          Final Clinical Impression(s) / ED Diagnoses Final diagnoses:  Fall, initial encounter  Rx / DC Orders ED Discharge Orders     None         Isla Pence, MD 12/16/21 1258

## 2021-12-16 NOTE — ED Notes (Signed)
Unable to get urine sample at this time.

## 2021-12-16 NOTE — ED Triage Notes (Signed)
Pt bib ems from home pt was found laying on the floor by son.

## 2021-12-16 NOTE — ED Notes (Signed)
Pt ambulated to and from bathroom using walker with no problems.

## 2021-12-22 ENCOUNTER — Encounter (HOSPITAL_COMMUNITY): Payer: Self-pay

## 2021-12-22 ENCOUNTER — Other Ambulatory Visit: Payer: Self-pay

## 2021-12-22 ENCOUNTER — Emergency Department (HOSPITAL_COMMUNITY)
Admission: EM | Admit: 2021-12-22 | Discharge: 2021-12-22 | Disposition: A | Discharge status: Home / Self Care | Payer: Medicare PPO | Attending: Emergency Medicine | Admitting: Emergency Medicine

## 2021-12-22 ENCOUNTER — Emergency Department (HOSPITAL_COMMUNITY): Payer: Medicare PPO

## 2021-12-22 DIAGNOSIS — F0390 Unspecified dementia without behavioral disturbance: Secondary | ICD-10-CM | POA: Diagnosis not present

## 2021-12-22 DIAGNOSIS — R4182 Altered mental status, unspecified: Secondary | ICD-10-CM | POA: Diagnosis not present

## 2021-12-22 DIAGNOSIS — U071 COVID-19: Secondary | ICD-10-CM | POA: Diagnosis not present

## 2021-12-22 DIAGNOSIS — R410 Disorientation, unspecified: Secondary | ICD-10-CM | POA: Diagnosis not present

## 2021-12-22 DIAGNOSIS — I509 Heart failure, unspecified: Secondary | ICD-10-CM | POA: Insufficient documentation

## 2021-12-22 DIAGNOSIS — N189 Chronic kidney disease, unspecified: Secondary | ICD-10-CM | POA: Diagnosis not present

## 2021-12-22 DIAGNOSIS — S199XXA Unspecified injury of neck, initial encounter: Secondary | ICD-10-CM | POA: Diagnosis not present

## 2021-12-22 DIAGNOSIS — I491 Atrial premature depolarization: Secondary | ICD-10-CM | POA: Diagnosis not present

## 2021-12-22 DIAGNOSIS — R051 Acute cough: Secondary | ICD-10-CM

## 2021-12-22 DIAGNOSIS — R9431 Abnormal electrocardiogram [ECG] [EKG]: Secondary | ICD-10-CM | POA: Diagnosis not present

## 2021-12-22 DIAGNOSIS — R059 Cough, unspecified: Secondary | ICD-10-CM | POA: Diagnosis not present

## 2021-12-22 DIAGNOSIS — J90 Pleural effusion, not elsewhere classified: Secondary | ICD-10-CM | POA: Diagnosis not present

## 2021-12-22 LAB — COMPREHENSIVE METABOLIC PANEL
ALT: 18 U/L (ref 0–44)
AST: 29 U/L (ref 15–41)
Albumin: 3.6 g/dL (ref 3.5–5.0)
Alkaline Phosphatase: 49 U/L (ref 38–126)
Anion gap: 5 (ref 5–15)
BUN: 29 mg/dL — ABNORMAL HIGH (ref 8–23)
CO2: 26 mmol/L (ref 22–32)
Calcium: 9.1 mg/dL (ref 8.9–10.3)
Chloride: 108 mmol/L (ref 98–111)
Creatinine, Ser: 1.45 mg/dL — ABNORMAL HIGH (ref 0.61–1.24)
GFR, Estimated: 44 mL/min — ABNORMAL LOW (ref >60)
Glucose, Bld: 113 mg/dL — ABNORMAL HIGH (ref 70–99)
Potassium: 4.9 mmol/L (ref 3.5–5.1)
Sodium: 139 mmol/L (ref 135–145)
Total Bilirubin: 1 mg/dL (ref 0.3–1.2)
Total Protein: 6.4 g/dL — ABNORMAL LOW (ref 6.5–8.1)

## 2021-12-22 LAB — CBC WITH DIFFERENTIAL/PLATELET
Abs Immature Granulocytes: 0.01 10*3/uL (ref 0.00–0.07)
Basophils Absolute: 0 10*3/uL (ref 0.0–0.1)
Basophils Relative: 0 %
Eosinophils Absolute: 0 10*3/uL (ref 0.0–0.5)
Eosinophils Relative: 0 %
HCT: 37.8 % — ABNORMAL LOW (ref 39.0–52.0)
Hemoglobin: 12 g/dL — ABNORMAL LOW (ref 13.0–17.0)
Immature Granulocytes: 0 %
Lymphocytes Relative: 28 %
Lymphs Abs: 0.8 10*3/uL (ref 0.7–4.0)
MCH: 31.1 pg (ref 26.0–34.0)
MCHC: 31.7 g/dL (ref 30.0–36.0)
MCV: 97.9 fL (ref 80.0–100.0)
Monocytes Absolute: 0.5 10*3/uL (ref 0.1–1.0)
Monocytes Relative: 18 %
Neutro Abs: 1.5 10*3/uL — ABNORMAL LOW (ref 1.7–7.7)
Neutrophils Relative %: 54 %
Platelets: 96 10*3/uL — ABNORMAL LOW (ref 150–400)
RBC: 3.86 MIL/uL — ABNORMAL LOW (ref 4.22–5.81)
RDW: 13.4 % (ref 11.5–15.5)
WBC: 2.9 10*3/uL — ABNORMAL LOW (ref 4.0–10.5)
nRBC: 0 % (ref 0.0–0.2)

## 2021-12-22 LAB — MAGNESIUM: Magnesium: 2.1 mg/dL (ref 1.7–2.4)

## 2021-12-22 LAB — SARS CORONAVIRUS 2 BY RT PCR: SARS Coronavirus 2 by RT PCR: POSITIVE — AB

## 2021-12-22 LAB — CBG MONITORING, ED: Glucose-Capillary: 104 mg/dL — ABNORMAL HIGH (ref 70–99)

## 2021-12-22 LAB — LACTIC ACID, PLASMA: Lactic Acid, Venous: 1.2 mmol/L (ref 0.5–1.9)

## 2021-12-22 LAB — GROUP A STREP BY PCR: Group A Strep by PCR: NOT DETECTED

## 2021-12-22 MED ORDER — DOXYCYCLINE MONOHYDRATE 100 MG PO CAPS: 100.0000 mg | ORAL_CAPSULE | Freq: Two times a day (BID) | ORAL | 0 refills | Status: AC

## 2021-12-22 NOTE — ED Triage Notes (Signed)
Pt brought in [facility] from home for AMS. Last seen normal yesterday. Recently tx for UTI. 123/70, 91 hr, 98.5, cbg 120.

## 2021-12-22 NOTE — Discharge Instructions (Signed)
You tested positive for COVID-19. Continue and complete the recommended isolation for 5 days. Over-the-counter medication to help with symptoms including cough and fever. Increase hydration. Return to emergency department if worsening fever, shortness of breath or worsening of altered mental status.

## 2021-12-22 NOTE — ED Provider Notes (Signed)
[facility]-EMERGENCY DEPT Provider Note   CSN: [id number] Arrival date & time: 12/22/21  1110     History  Chief Complaint  Patient presents with   Altered Mental Status    John Rose is a 88 y.o. male.  88 year old male with history of dementia, CHF on Lasix, chronic kidney disease presented to emergency department brought in by ambulance for altered mental status. History was provided by son.  Home health nurse found patient confused, disoriented and lethargic this morning and son called EMS and patient was transported to emergency department for further evaluation.  Arrival patient is alert.  He is following commands.  He was able to give his name and date of birth and was able to recognize his son.   Patient complaining of sore throat and leg pain.  He denies chest pain, shortness of breath, headache, dizziness, abdominal pain. Son reported that patient fell last night around 8:30 PM.  Patient found his dad on the floor kneeling down with arms extended.  Son picked the patient up and called 911.  Sunday denies head injury or LOC related to that fall.  EMS evaluated the patient and did not find injury. Patient treated antibiotic treatment for UTI on 9/18.  Son reported patient fell last week Friday and were seen in the emergency department.  He was evaluated and ruled out for fracture or acute injury.  Patient was discharged home.  Son further stated during the last week visit to ED he started to feel congested with nasal drainage and his father is also having cough.  Patient is tachypneic with a temp of 100.   The history is provided by the patient and a relative (Majority of the history was provided by son.). The history is limited by the condition of the patient. No language interpreter was used.  Altered Mental Status Associated symptoms: fever   Associated symptoms: no rash        Home Medications Prior to Admission medications   Medication Sig  Start Date End Date Taking? Authorizing Provider  doxycycline (MONODOX) 100 MG capsule Take 1 capsule (100 mg total) by mouth 2 (two) times daily for 7 days. 12/22/21 12/29/21 Yes Odessa Nishi, MD  acetaminophen (TYLENOL) 650 MG CR tablet Take 650 mg by mouth every 8 (eight) hours as needed for pain.    [provider]  ANDROGEL PUMP 20.25 MG/ACT (1.62%) GEL Apply 1 Dose topically daily. 08/09/21   Glade LloydAlekh, Kshitiz, MD  furosemide (LASIX) 40 MG tablet Take 1 tablet (40 mg total) by mouth daily. 12/14/21   Chandrasekhar, [place] DragonMahesh A, MD  gabapentin (NEURONTIN) 300 MG capsule Take 300 mg by mouth 2 (two) times daily. 11/19/21   [provider]  leptospermum manuka honey (MEDIHONEY) PSTE paste Apply 1 application. topically daily. Apply to right lateral lower leg wound (just below the knee) and cover with a foam dressing. Apply thin layer (3 mm) to wound. 08/10/21   Glade LloydAlekh, Kshitiz, MD  potassium chloride SA (KLOR-CON M) 20 MEQ tablet Take 1 tablet (20 mEq total) by mouth daily. 12/14/21   Chandrasekhar, [place] DragonMahesh A, MD  QUEtiapine (SEROQUEL) 25 MG tablet Take 12.5-25 mg by mouth at bedtime as needed. 11/10/21   [provider]  tamsulosin (FLOMAX) 0.4 MG CAPS capsule Take 0.4 mg by mouth daily.  08/16/16   [provider]  vitamin B-12 1000 MCG tablet Take 1 tablet (1,000 mcg total) by mouth daily. 08/10/21   Glade LloydAlekh, Kshitiz, MD  Allergies    Patient has no known allergies.    Review of Systems   Review of Systems  Constitutional:  Positive for fatigue and fever.  HENT:  Positive for sore throat.   Respiratory:  Positive for cough.   Skin:  Negative for rash and wound.    Physical Exam Updated Vital Signs BP 139/89   Pulse 86   Temp 99.6 F (37.6 C) (Oral)   Resp 17   SpO2 96%  Physical Exam Vitals and nursing note reviewed.  HENT:     Head: Normocephalic and atraumatic.     Mouth/Throat:     Pharynx: Posterior oropharyngeal erythema present.  Eyes:      Conjunctiva/sclera: Conjunctivae normal.     Pupils: Pupils are equal, round, and reactive to light.  Cardiovascular:     Rate and Rhythm: Normal rate and regular rhythm.  Pulmonary:     Effort: Pulmonary effort is normal. No respiratory distress.     Breath sounds: Normal breath sounds. No wheezing or rales.  Abdominal:     General: Bowel sounds are normal.     Tenderness: There is no abdominal tenderness. There is no guarding or rebound.     Hernia: No hernia is present.  Musculoskeletal:        General: Normal range of motion.     Cervical back: Neck supple.  Skin:    General: Skin is warm.  Neurological:     Mental Status: He is alert. Mental status is at baseline.  Psychiatric:        Mood and Affect: Mood normal.     ED Results / Procedures / Treatments   Labs (all labs ordered are listed, but only abnormal results are displayed) Labs Reviewed  SARS CORONAVIRUS 2 BY RT PCR - Abnormal; Notable for the following components:      Result Value   SARS Coronavirus 2 by RT PCR POSITIVE (*)    All other components within normal limits  CBC WITH DIFFERENTIAL/PLATELET - Abnormal; Notable for the following components:   WBC 2.9 (*)    RBC 3.86 (*)    Hemoglobin 12.0 (*)    HCT 37.8 (*)    Platelets 96 (*)    Neutro Abs 1.5 (*)    All other components within normal limits  COMPREHENSIVE METABOLIC PANEL - Abnormal; Notable for the following components:   Glucose, Bld 113 (*)    BUN 29 (*)    Creatinine, Ser 1.45 (*)    Total Protein 6.4 (*)    GFR, Estimated 44 (*)    All other components within normal limits  CBG MONITORING, ED - Abnormal; Notable for the following components:   Glucose-Capillary 104 (*)    All other components within normal limits  GROUP A STREP BY PCR  LACTIC ACID, PLASMA  MAGNESIUM  URINALYSIS, ROUTINE W REFLEX MICROSCOPIC  LACTIC ACID, PLASMA    EKG None  Radiology DG Chest Portable 1 View  Result Date: 12/22/2021 CLINICAL DATA:  Cough.  EXAM: PORTABLE CHEST 1 VIEW COMPARISON:  December 16, 2021. FINDINGS: Stable cardiomegaly. Stable right pleural effusion is noted with associated right basilar atelectasis or infiltrate. Left lung is unremarkable. Bony thorax is unremarkable. IMPRESSION: Stable right pleural effusion is noted with associated right basilar atelectasis or infiltrate. Electronically Signed   By: Lupita Raider M.D.   On: 12/22/2021 15:45   CT Head Wo Contrast  Result Date: 12/22/2021 CLINICAL DATA:  Altered mental status.  Neck trauma EXAM:  CT HEAD WITHOUT CONTRAST CT CERVICAL SPINE WITHOUT CONTRAST TECHNIQUE: Multidetector CT imaging of the head and cervical spine was performed following the standard protocol without intravenous contrast. Multiplanar CT image reconstructions of the cervical spine were also generated. RADIATION DOSE REDUCTION: This exam was performed according to the departmental dose-optimization program which includes automated exposure control, adjustment of the mA and/or kV according to patient size and/or use of iterative reconstruction technique. COMPARISON:  CT head and cervical spine 12/16/2021 FINDINGS: CT HEAD FINDINGS Brain: Generalized atrophy. Mild periventricular white matter hypodensity, unchanged. Negative for acute infarct, hemorrhage, mass Vascular: Negative for hyperdense vessel Skull: Negative Sinuses/Orbits: Mild mucosal edema paranasal sinuses. Small air-fluid level right sphenoid sinus. Negative orbit Other: None CT CERVICAL SPINE FINDINGS Alignment: Anterolisthesis C3-4 and C7-T1. Skull base and vertebrae: Negative for cervical spine fracture Soft tissues and spinal canal: Left thyroid nodule 32 x 23 mm, unchanged from the prior study. No enlarged lymph nodes in the neck. Disc levels: Cervical spondylosis with advanced disc degeneration and facet degeneration throughout the cervical spine. Mild spinal stenosis C5-6 and C6-7. Bilateral foraminal encroachment C5-6 and C6-7. Upper chest:  Moderate right pleural effusion and small left pleural effusion Other: None IMPRESSION: 1. No acute intracranial abnormality 2. Cervical spondylosis.  Negative for cervical spine fracture 3. Left thyroid nodule 32 x 23 mm. Thyroid ultrasound recommended for further evaluation. (Ref: J Am Coll Radiol. 2015 Feb;12(2): 143-50). Electronically Signed   By: Franchot Gallo M.D.   On: 12/22/2021 13:47   CT Cervical Spine Wo Contrast  Result Date: 12/22/2021 CLINICAL DATA:  Altered mental status.  Neck trauma EXAM: CT HEAD WITHOUT CONTRAST CT CERVICAL SPINE WITHOUT CONTRAST TECHNIQUE: Multidetector CT imaging of the head and cervical spine was performed following the standard protocol without intravenous contrast. Multiplanar CT image reconstructions of the cervical spine were also generated. RADIATION DOSE REDUCTION: This exam was performed according to the departmental dose-optimization program which includes automated exposure control, adjustment of the mA and/or kV according to patient size and/or use of iterative reconstruction technique. COMPARISON:  CT head and cervical spine 12/16/2021 FINDINGS: CT HEAD FINDINGS Brain: Generalized atrophy. Mild periventricular white matter hypodensity, unchanged. Negative for acute infarct, hemorrhage, mass Vascular: Negative for hyperdense vessel Skull: Negative Sinuses/Orbits: Mild mucosal edema paranasal sinuses. Small air-fluid level right sphenoid sinus. Negative orbit Other: None CT CERVICAL SPINE FINDINGS Alignment: Anterolisthesis C3-4 and C7-T1. Skull base and vertebrae: Negative for cervical spine fracture Soft tissues and spinal canal: Left thyroid nodule 32 x 23 mm, unchanged from the prior study. No enlarged lymph nodes in the neck. Disc levels: Cervical spondylosis with advanced disc degeneration and facet degeneration throughout the cervical spine. Mild spinal stenosis C5-6 and C6-7. Bilateral foraminal encroachment C5-6 and C6-7. Upper chest: Moderate right  pleural effusion and small left pleural effusion Other: None IMPRESSION: 1. No acute intracranial abnormality 2. Cervical spondylosis.  Negative for cervical spine fracture 3. Left thyroid nodule 32 x 23 mm. Thyroid ultrasound recommended for further evaluation. (Ref: J Am Coll Radiol. 2015 Feb;12(2): 143-50). Electronically Signed   By: Franchot Gallo M.D.   On: 12/22/2021 13:47    Procedures Procedures    Medications Ordered in ED Medications - No data to display  ED Course/ Medical Decision Making/ A&P                           Medical Decision Making 88 year old male with history of dementia, congestive heart failure on Lasix,  chronic kidney disease brought to emergency department via ambulance for evaluation of altered mental status. Home health nurse this morning found patient to be confused, disoriented and lethargic.  She notified the son who called EMS and patient was transported to emergency department.  Patient use walker.  Son states patient lost balance while he was trying to pick paper from the floor and fell last night.  Incident happened around 8:30 PM last night.  He was on his knees with arms standing on the floor.  It was not a witnessed fall however son denies LOC.  Patient called 911 who came in and evaluated the patient and did not find acute injury.  He recently completed the course of antibiotic for UTI. Patient was also seen in the emergency department last week after a fall.  Patient was evaluated and ruled out for acute injuries.  He was discharged home.  Son stated he started feeling flulike symptoms after he left the hospital and his father started to have cough.  On arrival patient is alert to person however he is disoriented to place and time.  CBG 104.  Patient complaining of sore throat and pain in both legs.  Lungs clear to auscultation.  S1-S2 normal.  Abdomen slightly rigid, nondistended and nontender.  No CVA tenderness.  No pedal edema.  No skin rashes.  No  hematoma, bruises or ecchymosis appreciated.  After the physical examination I am suspecting acute infectious process likely lower respiratory tract infection, UTI or altered mental status secondary to head/neck injury from fall last night.  CBC: Patient is neutropenic with white count of 2.9.  CMP demonstrates renal insufficiency with creatinine of 1.45 normal lactic acid.  Normal magnesium. CT head and C-spine: No acute intracranial abnormalities noted.  Accidental finding of left thyroid nodule.  Thyroid ultrasound is recommended. Pt will Follow up with PCP as an out pt evaluation of the thyroid nodule as per Dr. Jeraldine Loots.  Patient tested positive for COVID-19.  I have discussed the findings with the son and the patient. Chest x-ray demonstrated right lower lobe infiltrate which is suspicious for possible pneumonia.  I will add an antibiotic doxycycline for 7 days. Lactic acid within normal limits. Patient has fluctuating levels of consciousness which his son is describing as ongoing for past few months.  However patient is at his baseline since he presented to the emergency department.  His vital signs stable and in no acute visible distress.  After reviewing the imaging studies and lab results I have no concerns for acute bacterial infection.  Patient's symptoms are highly likely secondary to COVID-19 viral infection.  Patient will be discharged home with education and isolation precautions were. Over-the-counter symptomatic treatment for cough and congestion.  Encouraged to increase hydration.  PCP follow-up if symptoms do not improve.  Patient was unable to provide urine specimen.  I have brought this to Dr. Priscille Loveless attention.  Dr. Jeraldine Loots is okay for patient to be discharged without the urine specimen.  It is highly unlikely patient has a UTI.    If you start to experience new or worsening cough, fever, shortness of breath, chest pain please return to emergency department for  reevaluation.              Final Clinical Impression(s) / ED Diagnoses Final diagnoses:  COVID-19  Altered mental status, unspecified altered mental status type  Acute cough    Rx / DC Orders ED Discharge Orders  Ordered    doxycycline (MONODOX) 100 MG capsule  2 times daily        12/22/21 1611              Dontrez Pettis, MD 12/22/21 1619    Gerhard Munch, MD 12/22/21 [phone]

## 2021-12-31 DIAGNOSIS — I5022 Chronic systolic (congestive) heart failure: Secondary | ICD-10-CM | POA: Diagnosis not present

## 2021-12-31 DIAGNOSIS — M159 Polyosteoarthritis, unspecified: Secondary | ICD-10-CM | POA: Diagnosis not present

## 2021-12-31 DIAGNOSIS — D638 Anemia in other chronic diseases classified elsewhere: Secondary | ICD-10-CM | POA: Diagnosis not present

## 2021-12-31 DIAGNOSIS — M47816 Spondylosis without myelopathy or radiculopathy, lumbar region: Secondary | ICD-10-CM | POA: Diagnosis not present

## 2021-12-31 DIAGNOSIS — R54 Age-related physical debility: Secondary | ICD-10-CM | POA: Diagnosis not present

## 2022-01-12 ENCOUNTER — Telehealth: Payer: Self-pay

## 2022-01-12 NOTE — Telephone Encounter (Signed)
The patient son Leane Call (ok per pt) has been notified of the result and verbalized understanding.  All questions (if any) were answered. Precious Gilding, RN 01/12/2022 2:04 PM  Son reports pt only took furosemide 40 mg QD for 5 days then decreased to 20 mg QD.  Denies swelling and breathing difficulty.  Medication list updated.

## 2022-01-12 NOTE — Addendum Note (Signed)
Addended by: Precious Gilding on: 01/12/2022 02:07 PM   Modules accepted: Orders

## 2022-01-12 NOTE — Telephone Encounter (Signed)
-----   Message from Werner Lean, MD sent at 12/18/2021  3:26 PM EDT ----- Testing Results WNL.  If he is diuresing to much we can back down to 20 mg dose; this will increase his risk of swelling and breathing problems.

## 2022-02-27 ENCOUNTER — Telehealth: Payer: Self-pay

## 2022-02-27 NOTE — Telephone Encounter (Signed)
(  11:38 am) PC SW scheduled and initial palliative care visit with patient. RN/SW team to see patient in his home on 03/01/22 @ 12:30 pm. His son will also be present with him.

## 2022-03-01 ENCOUNTER — Other Ambulatory Visit: Payer: Medicare PPO | Admitting: *Deleted

## 2022-03-01 ENCOUNTER — Other Ambulatory Visit: Payer: Medicare PPO

## 2022-03-01 VITALS — BP 116/74 | HR 81 | Temp 98.1°F | Resp 18

## 2022-03-01 DIAGNOSIS — Z515 Encounter for palliative care: Secondary | ICD-10-CM

## 2022-03-09 NOTE — Progress Notes (Signed)
[facility] COMMUNITY PALLIATIVE CARE RN NOTE  PATIENT NAME: John Rose DOB: [birth year] MRN: [id number]  PRIMARY CARE PROVIDER: [facility]  RESPONSIBLE PARTY: Lenn Sink (son) Acct ID - Guarantor Home Phone Work Phone Relationship Acct Type  [id number] AMI, THORNSBERRY* [phone]  Self P/F     [address], [place], [place] [address]    RN completed initial palliative care visit with patient and his son in their home. Hired caregiver also present during visit.  Cognitive: Increased confusion and forgetfulness, however patient is unaware this is occurring. He became verbally agitated towards his son several times during visit.  Pain: Patient c/o right hand pain and left hip pain from arthritis. His right hand stays cold and numb. He is currently taking Gabapentin 300 mg BID and Tylenol but feels that it is not helping.   Respiratory: Breathing even, regular and unlabored during visit.  Cardiovascular: Patient diagnosed this year with CHF. He has had 2 occurrences where he has required a 5 days course increase in Lasix to 40 mg daily. He has responded well each time. Now back on 20 mg daily. He does currently have some bilateral lower extremity 1+ pitting edema. Legs are shiny. Denies discomfort or feelings of tightness. Weight has been stable. Weight today 143lbs.  Mobility: Patient is ambulatory using a walker. He does have hired caregivers to assist with personal care needs. He says he exercises daily at home to help with body stiffness.  Appetite: Reports having a very good appetite. No dysphagia.  Goals of care: Patient wants to remain at home. He is a Full code. He was recently evaluated for hospice and did not meet eligibility requirements. He also does not meet criteria for palliative care at this time as he does not have a cancer diagnosis or near hospice at this time. Son is pleased about this and agrees to contact [facility] if his condition were to decline. I  called [facility] and spoke with Myriam Jacobson nurse to make her aware that patient is not currently eligible for services and regarding his pain concerns.   CODE STATUS: Full code ADVANCED DIRECTIVES: Y MOST FORM: no PPS: 50%   PHYSICAL EXAM:   VITALS: Today's Vitals   03/01/22 1300  BP: 116/74  Pulse: 81  Resp: 18  Temp: 98.1 F (36.7 C)  TempSrc: Temporal  SpO2: 99%  PainSc: 4   PainLoc: Hand      Candiss Norse, RN BSN

## 2022-03-14 ENCOUNTER — Ambulatory Visit: Payer: Medicare PPO | Attending: Nurse Practitioner | Admitting: Nurse Practitioner

## 2022-03-14 ENCOUNTER — Ambulatory Visit: Payer: Medicare PPO | Admitting: Nurse Practitioner

## 2022-03-14 ENCOUNTER — Encounter: Payer: Self-pay | Admitting: Nurse Practitioner

## 2022-03-14 VITALS — BP 114/60 | HR 76 | Ht 68.0 in | Wt 157.0 lb

## 2022-03-14 DIAGNOSIS — R600 Localized edema: Secondary | ICD-10-CM

## 2022-03-14 DIAGNOSIS — R002 Palpitations: Secondary | ICD-10-CM | POA: Diagnosis not present

## 2022-03-14 DIAGNOSIS — I5032 Chronic diastolic (congestive) heart failure: Secondary | ICD-10-CM

## 2022-03-14 NOTE — Patient Instructions (Signed)
Medication Instructions:   Your physician recommends that you continue on your current medications as directed. Please refer to the Current Medication list given to you today.   *If you need a refill on your cardiac medications before your next appointment, please call your pharmacy*   Lab Work:  TODAY!!!! BMET  If you have labs (blood work) drawn today and your tests are completely normal, you will receive your results only by: MyChart Message (if you have MyChart) OR A paper copy in the mail If you have any lab test that is abnormal or we need to change your treatment, we will call you to review the results.   Testing/Procedures:  None ordered.   Follow-Up: At [facility], you and your health needs are our priority.  As part of our continuing mission to provide you with exceptional heart care, we have created designated Provider Care Teams.  These Care Teams include your primary Cardiologist (physician) and Advanced Practice Providers (APPs -  Physician Assistants and Nurse Practitioners) who all work together to provide you with the care you need, when you need it.  We recommend signing up for the patient portal called "MyChart".  Sign up information is provided on this After Visit Summary.  MyChart is used to connect with patients for Virtual Visits (Telemedicine).  Patients are able to view lab/test results, encounter notes, upcoming appointments, etc.  Non-urgent messages can be sent to your provider as well.   To learn more about what you can do with MyChart, go to [email or web address].    Your next appointment:   6 month(s)  The format for your next appointment:   In Person  Provider:   Dr. Izora Ribas   Other Instructions Heart Failure Education: Weigh yourself EVERY morning after you go to the bathroom but before you eat or drink anything. Write this number down in a weight log/diary. If you gain 3 pounds overnight or 5 pounds in a week, call the  office. Take your medicines as prescribed. If you have concerns about your medications, please call us before you stop taking them.  Eat low salt foods--Limit salt (sodium) to 2000 mg per day. This will help prevent your body from holding onto fluid. Read food labels as many processed foods have a lot of sodium, especially canned goods and prepackaged meats. If you would like some assistance choosing low sodium foods, we would be happy to set you up with a nutritionist. Stay as active as you can everyday. Staying active will give you more energy and make your muscles stronger. Start with 5 minutes at a time and work your way up to 30 minutes a day. Break up your activities--do some in the morning and some in the afternoon. Start with 3 days per week and work your way up to 5 days as you can.  If you have chest pain, feel short of breath, dizzy, or lightheaded, STOP. If you don't feel better after a short rest, call 911. If you do feel better, call the office to let us know you have symptoms with exercise. Limit all fluids for the day to less than 2 liters. Fluid includes all drinks, coffee, juice, ice chips, soup, jello, and all other liquids.   Important Information About Sugar

## 2022-03-14 NOTE — Progress Notes (Signed)
Cardiology Office Note:    Date:  03/14/2022   ID:  John Rose, DOB [birth year], MRN [id number]  PCP:  [facility]   [facility] Providers Cardiologist:  None     Referring MD: [facility]   Chief Complaint: follow-up CHF  History of Present Illness:    John Rose is a very pleasant  88 y.o. male with a hx of dementia, hard of hearing, CKD stage IIIb, and chronic HFpEF, referred to cardiology for irregular heartbeats and seen by Dr. Raynelle Jan 12/14/2021.  He reported he was feeling well but when he attempts to walk for exercise he feels fatigue. Was following advice of people who tell him he needs to walk. Occasionally has exertional feeling in his heart.  On 08/04/2021 he was found on the floor by his son.  Was found to have rhabdomyolysis.  He had been given 5 days of Lasix 40 mg for leg swelling.  Went to a nursing center for 30 days and at office visit was living with his son with 8 hours of care from a nurse.  Was diagnosed in the past with metabolic encephalopathy and communication disorder but no history of dementia.  Recent UTI. Noted to have rare PACs. Plan to get BNP and CMET at visit. Advised to resume Lasix 40 mg and Kdur 20 mEq, no SGLT2i 2/2 UTI, low sodium diet. Had stable lab results, advised that he could reduce Lasix if he felt he was overdiuresed.  Today, he is here for follow-up. He is traveling in a wheelchair accompanied by his son with whom he lives. Reports he is asked to walk an excessive amount which is challenging for him. Has nurses that come into the home. His son reports that before he lost the excessive fluid weight, it was much more challenging for the patient to walk around but since losing weight, he is getting around well. He walks around inside the house for exercise. Likes to sit for long periods and read so has been encouraged to lessen stiffness and edema by walking for brief periods throughout the day. He denies chest  pain, shortness of breath, lower extremity edema, fatigue, palpitations, melena, hematuria, hemoptysis, diaphoresis, weakness, presyncope, syncope, orthopnea, and PND. His son feels that the current diuretic therapy is working well for him.   Past Medical History:  Diagnosis Date   Lumbar spondylosis 11/10/2016   Testosterone deficiency     Past Surgical History:  Procedure Laterality Date   NO PAST SURGERIES      Current Medications: Current Meds  Medication Sig   acetaminophen (TYLENOL) 650 MG CR tablet Take 650 mg by mouth every 8 (eight) hours as needed for pain.   DICLOFENAC SODIUM-MENTHOL GEL EX Apply topically daily as needed.   furosemide (LASIX) 20 MG tablet Take 20 mg by mouth daily.   gabapentin (NEURONTIN) 300 MG capsule Take 300 mg by mouth 2 (two) times daily.   leptospermum manuka honey (MEDIHONEY) PSTE paste Apply 1 application. topically daily. Apply to right lateral lower leg wound (just below the knee) and cover with a foam dressing. Apply thin layer (3 mm) to wound.   Multiple Vitamin (MULTIVITAMIN ADULT PO) Take 1 tablet by mouth daily.   potassium chloride SA (KLOR-CON M) 20 MEQ tablet Take 1 tablet (20 mEq total) by mouth daily.   QUEtiapine (SEROQUEL) 25 MG tablet Take 12.5-25 mg by mouth at bedtime as needed.   simethicone (MYLICON) 80 MG chewable tablet Chew 80 mg  by mouth every 6 (six) hours as needed for flatulence.   tamsulosin (FLOMAX) 0.4 MG CAPS capsule Take 0.4 mg by mouth daily.      Allergies:   Patient has no known allergies.   Social History   Socioeconomic History   Marital status: Single    Spouse name: Not on file   Number of children: 1   Years of education: BS   Highest education level: Not on file  Occupational History   Occupation: Retired  Tobacco Use   Smoking status: Never   Smokeless tobacco: Never  Vaping Use   Vaping Use: Never used  Substance and Sexual Activity   Alcohol use: No   Drug use: No   Sexual activity: Not on  file  Other Topics Concern   Not on file  Social History Narrative   Lives    Caffeine use: Tea, soda (pepsi)   Social Determinants of Health   Financial Resource Strain: Not on file  Food Insecurity: Not on file  Transportation Needs: Not on file  Physical Activity: Not on file  Stress: Not on file  Social Connections: Not on file     Family History: The patient's family history includes Hypertension in an other family member.  ROS:   Please see the history of present illness.   All other systems reviewed and are negative.  Labs/Other Studies Reviewed:    The following studies were reviewed today:  Echo 08/05/21 1. Left ventricular ejection fraction, by estimation, is 60 to 65%. The  left ventricle has normal function. The left ventricle has no regional  wall motion abnormalities. There is severe left ventricular hypertrophy.  Left ventricular diastolic parameters   are consistent with Grade II diastolic dysfunction (pseudonormalization).   2. Right ventricular systolic function is normal. The right ventricular  size is normal. There is mildly elevated pulmonary artery systolic  pressure.   3. Left atrial size was moderately dilated.   4. Right atrial size was moderately dilated.   5. The mitral valve is grossly normal. No evidence of mitral valve  regurgitation.   6. The aortic valve is calcified. Aortic valve regurgitation is not  visualized. Aortic valve sclerosis/calcification is present, without any  evidence of aortic stenosis.   7. The inferior vena cava is normal in size with greater than 50%  respiratory variability, suggesting right atrial pressure of 3 mmHg.   Comparison(s): No prior Echocardiogram. Recent Labs: 11/08/2021: B Natriuretic Peptide 918.2 12/22/2021: ALT 18; BUN 29; Creatinine, Ser 1.45; Hemoglobin 12.0; Magnesium 2.1; Platelets 96; Potassium 4.9; Sodium 139  Recent Lipid Panel No results found for: "CHOL", "TRIG", "HDL", "CHOLHDL", "VLDL",  "LDLCALC", "LDLDIRECT"   Risk Assessment/Calculations:      Physical Exam:    VS:  BP 114/60   Pulse 76   Ht 5\' 8"  (1.727 m)   Wt 157 lb (71.2 kg)   SpO2 98%   BMI 23.87 kg/m     Wt Readings from Last 3 Encounters:  03/14/22 157 lb (71.2 kg)  12/16/21 171 lb 15.3 oz (78 kg)  12/14/21 174 lb (78.9 kg)     GEN:  Elderly well nourished, well developed in no acute distress HEENT: Normal NECK: No JVD; No carotid bruits CARDIAC: RRR, no murmurs, rubs, gallops RESPIRATORY:  Clear to auscultation without rales, wheezing or rhonchi  ABDOMEN: Soft, non-tender, non-distended MUSCULOSKELETAL:  No edema; No deformity. 2+ pedal pulses, equal bilaterally SKIN: Warm and dry NEUROLOGIC:  Alert and oriented x 3 PSYCHIATRIC:  Normal affect   EKG:  EKG is not ordered today.     Diagnoses:    1. Chronic heart failure with preserved ejection fraction (HCC)   2. Bilateral lower extremity edema   3. Heart palpitations    Assessment and Plan:     Chronic HFpEF: LVEF 60-65%, G2DD on echo 08/2021. Improved leg edema and weight loss on Lasix. Son reports significant weight loss since last office visit. He denies dyspnea, orthopnea, PND, edema, chest pain. Appears euvolemic on exam. Will check bmet today. Continue Lasix and potassium.  Leg edema: Improved on current diuretic therapy per son's report. No edema present today. Continue Lasix.   Palpitations: Quiescent at this time.   CKD stage 3b: Scr 1.45, bun 29 on 12/22/21. Will recheck today.      Disposition: 6 months with Dr. Izora Ribas  Medication Adjustments/Labs and Tests Ordered: Current medicines are reviewed at length with the patient today.  Concerns regarding medicines are outlined above.  Orders Placed This Encounter  Procedures   Basic Metabolic Panel (BMET)   No orders of the defined types were placed in this encounter.   Patient Instructions  Medication Instructions:   Your physician recommends that you continue  on your current medications as directed. Please refer to the Current Medication list given to you today.   *If you need a refill on your cardiac medications before your next appointment, please call your pharmacy*   Lab Work:  TODAY!!!! BMET  If you have labs (blood work) drawn today and your tests are completely normal, you will receive your results only by: MyChart Message (if you have MyChart) OR A paper copy in the mail If you have any lab test that is abnormal or we need to change your treatment, we will call you to review the results.   Testing/Procedures:  None ordered.   Follow-Up: At [facility], you and your health needs are our priority.  As part of our continuing mission to provide you with exceptional heart care, we have created designated Provider Care Teams.  These Care Teams include your primary Cardiologist (physician) and Advanced Practice Providers (APPs -  Physician Assistants and Nurse Practitioners) who all work together to provide you with the care you need, when you need it.  We recommend signing up for the patient portal called "MyChart".  Sign up information is provided on this After Visit Summary.  MyChart is used to connect with patients for Virtual Visits (Telemedicine).  Patients are able to view lab/test results, encounter notes, upcoming appointments, etc.  Non-urgent messages can be sent to your provider as well.   To learn more about what you can do with MyChart, go to [email or web address].    Your next appointment:   6 month(s)  The format for your next appointment:   In Person  Provider:   Dr. Izora Ribas   Other Instructions Heart Failure Education: Weigh yourself EVERY morning after you go to the bathroom but before you eat or drink anything. Write this number down in a weight log/diary. If you gain 3 pounds overnight or 5 pounds in a week, call the office. Take your medicines as prescribed. If you have concerns about your  medications, please call us before you stop taking them.  Eat low salt foods--Limit salt (sodium) to 2000 mg per day. This will help prevent your body from holding onto fluid. Read food labels as many processed foods have a lot of sodium, especially canned goods and prepackaged meats. If you  would like some assistance choosing low sodium foods, we would be happy to set you up with a nutritionist. Stay as active as you can everyday. Staying active will give you more energy and make your muscles stronger. Start with 5 minutes at a time and work your way up to 30 minutes a day. Break up your activities--do some in the morning and some in the afternoon. Start with 3 days per week and work your way up to 5 days as you can.  If you have chest pain, feel short of breath, dizzy, or lightheaded, STOP. If you don't feel better after a short rest, call 911. If you do feel better, call the office to let us know you have symptoms with exercise. Limit all fluids for the day to less than 2 liters. Fluid includes all drinks, coffee, juice, ice chips, soup, jello, and all other liquids.   Important Information About Sugar         Signed, Levi AlandSwinyer, Cordera Stineman M, NP  03/14/2022 1:27 PM    [facility]

## 2022-03-15 LAB — BASIC METABOLIC PANEL
BUN/Creatinine Ratio: 15 (ref 10–24)
BUN: 22 mg/dL (ref 10–36)
CO2: 25 mmol/L (ref 20–29)
Calcium: 9.1 mg/dL (ref 8.6–10.2)
Chloride: 107 mmol/L — ABNORMAL HIGH (ref 96–106)
Creatinine, Ser: 1.45 mg/dL — ABNORMAL HIGH (ref 0.76–1.27)
Glucose: 150 mg/dL — ABNORMAL HIGH (ref 70–99)
Potassium: 4.8 mmol/L (ref 3.5–5.2)
Sodium: 142 mmol/L (ref 134–144)
eGFR: 44 mL/min/{1.73_m2} — ABNORMAL LOW (ref >59)

## 2022-03-21 ENCOUNTER — Ambulatory Visit: Payer: Medicare PPO | Admitting: Cardiology

## 2022-04-04 DIAGNOSIS — D638 Anemia in other chronic diseases classified elsewhere: Secondary | ICD-10-CM | POA: Diagnosis not present

## 2022-04-04 DIAGNOSIS — R058 Other specified cough: Secondary | ICD-10-CM | POA: Diagnosis not present

## 2022-04-04 DIAGNOSIS — D72819 Decreased white blood cell count, unspecified: Secondary | ICD-10-CM | POA: Diagnosis not present

## 2022-04-04 DIAGNOSIS — M159 Polyosteoarthritis, unspecified: Secondary | ICD-10-CM | POA: Diagnosis not present

## 2022-04-04 DIAGNOSIS — E538 Deficiency of other specified B group vitamins: Secondary | ICD-10-CM | POA: Diagnosis not present

## 2022-04-04 DIAGNOSIS — N400 Enlarged prostate without lower urinary tract symptoms: Secondary | ICD-10-CM | POA: Diagnosis not present

## 2022-04-04 DIAGNOSIS — E042 Nontoxic multinodular goiter: Secondary | ICD-10-CM | POA: Diagnosis not present

## 2022-04-04 DIAGNOSIS — I5022 Chronic systolic (congestive) heart failure: Secondary | ICD-10-CM | POA: Diagnosis not present

## 2022-04-04 DIAGNOSIS — N1832 Chronic kidney disease, stage 3b: Secondary | ICD-10-CM | POA: Diagnosis not present

## 2022-04-04 DIAGNOSIS — G6181 Chronic inflammatory demyelinating polyneuritis: Secondary | ICD-10-CM | POA: Diagnosis not present

## 2022-04-24 DIAGNOSIS — M159 Polyosteoarthritis, unspecified: Secondary | ICD-10-CM | POA: Diagnosis not present

## 2022-04-24 DIAGNOSIS — I5022 Chronic systolic (congestive) heart failure: Secondary | ICD-10-CM | POA: Diagnosis not present

## 2022-04-24 DIAGNOSIS — M47816 Spondylosis without myelopathy or radiculopathy, lumbar region: Secondary | ICD-10-CM | POA: Diagnosis not present

## 2022-04-24 DIAGNOSIS — R54 Age-related physical debility: Secondary | ICD-10-CM | POA: Diagnosis not present

## 2022-04-24 DIAGNOSIS — N400 Enlarged prostate without lower urinary tract symptoms: Secondary | ICD-10-CM | POA: Diagnosis not present

## 2022-04-24 DIAGNOSIS — F411 Generalized anxiety disorder: Secondary | ICD-10-CM | POA: Diagnosis not present

## 2022-04-24 DIAGNOSIS — D638 Anemia in other chronic diseases classified elsewhere: Secondary | ICD-10-CM | POA: Diagnosis not present

## 2022-05-03 DIAGNOSIS — E042 Nontoxic multinodular goiter: Secondary | ICD-10-CM | POA: Diagnosis not present

## 2022-05-03 DIAGNOSIS — R54 Age-related physical debility: Secondary | ICD-10-CM | POA: Diagnosis not present

## 2022-05-03 DIAGNOSIS — D638 Anemia in other chronic diseases classified elsewhere: Secondary | ICD-10-CM | POA: Diagnosis not present

## 2022-05-03 DIAGNOSIS — M159 Polyosteoarthritis, unspecified: Secondary | ICD-10-CM | POA: Diagnosis not present

## 2022-05-03 DIAGNOSIS — N1832 Chronic kidney disease, stage 3b: Secondary | ICD-10-CM | POA: Diagnosis not present

## 2022-05-03 DIAGNOSIS — N400 Enlarged prostate without lower urinary tract symptoms: Secondary | ICD-10-CM | POA: Diagnosis not present

## 2022-05-03 DIAGNOSIS — I5022 Chronic systolic (congestive) heart failure: Secondary | ICD-10-CM | POA: Diagnosis not present

## 2022-05-03 DIAGNOSIS — E538 Deficiency of other specified B group vitamins: Secondary | ICD-10-CM | POA: Diagnosis not present

## 2022-05-03 DIAGNOSIS — D72819 Decreased white blood cell count, unspecified: Secondary | ICD-10-CM | POA: Diagnosis not present

## 2022-05-18 DIAGNOSIS — I5022 Chronic systolic (congestive) heart failure: Secondary | ICD-10-CM | POA: Diagnosis not present

## 2022-05-18 DIAGNOSIS — M159 Polyosteoarthritis, unspecified: Secondary | ICD-10-CM | POA: Diagnosis not present

## 2022-05-18 DIAGNOSIS — R54 Age-related physical debility: Secondary | ICD-10-CM | POA: Diagnosis not present

## 2022-05-18 DIAGNOSIS — F411 Generalized anxiety disorder: Secondary | ICD-10-CM | POA: Diagnosis not present

## 2022-05-18 DIAGNOSIS — D638 Anemia in other chronic diseases classified elsewhere: Secondary | ICD-10-CM | POA: Diagnosis not present

## 2022-05-18 DIAGNOSIS — M47816 Spondylosis without myelopathy or radiculopathy, lumbar region: Secondary | ICD-10-CM | POA: Diagnosis not present

## 2022-06-15 DIAGNOSIS — M159 Polyosteoarthritis, unspecified: Secondary | ICD-10-CM | POA: Diagnosis not present

## 2022-06-15 DIAGNOSIS — R54 Age-related physical debility: Secondary | ICD-10-CM | POA: Diagnosis not present

## 2022-06-15 DIAGNOSIS — R1030 Lower abdominal pain, unspecified: Secondary | ICD-10-CM | POA: Diagnosis not present

## 2022-06-15 DIAGNOSIS — N400 Enlarged prostate without lower urinary tract symptoms: Secondary | ICD-10-CM | POA: Diagnosis not present

## 2022-06-15 DIAGNOSIS — N1832 Chronic kidney disease, stage 3b: Secondary | ICD-10-CM | POA: Diagnosis not present

## 2022-06-15 DIAGNOSIS — E538 Deficiency of other specified B group vitamins: Secondary | ICD-10-CM | POA: Diagnosis not present

## 2022-06-15 DIAGNOSIS — D638 Anemia in other chronic diseases classified elsewhere: Secondary | ICD-10-CM | POA: Diagnosis not present

## 2022-06-15 DIAGNOSIS — I5022 Chronic systolic (congestive) heart failure: Secondary | ICD-10-CM | POA: Diagnosis not present

## 2022-06-15 DIAGNOSIS — R451 Restlessness and agitation: Secondary | ICD-10-CM | POA: Diagnosis not present

## 2022-06-19 DIAGNOSIS — R222 Localized swelling, mass and lump, trunk: Secondary | ICD-10-CM | POA: Diagnosis not present

## 2022-06-22 DIAGNOSIS — K7689 Other specified diseases of liver: Secondary | ICD-10-CM | POA: Diagnosis not present

## 2022-06-22 DIAGNOSIS — N281 Cyst of kidney, acquired: Secondary | ICD-10-CM | POA: Diagnosis not present

## 2022-06-28 DIAGNOSIS — D638 Anemia in other chronic diseases classified elsewhere: Secondary | ICD-10-CM | POA: Diagnosis not present

## 2022-06-28 DIAGNOSIS — F411 Generalized anxiety disorder: Secondary | ICD-10-CM | POA: Diagnosis not present

## 2022-06-28 DIAGNOSIS — M159 Polyosteoarthritis, unspecified: Secondary | ICD-10-CM | POA: Diagnosis not present

## 2022-06-28 DIAGNOSIS — I5022 Chronic systolic (congestive) heart failure: Secondary | ICD-10-CM | POA: Diagnosis not present

## 2022-06-28 DIAGNOSIS — M47816 Spondylosis without myelopathy or radiculopathy, lumbar region: Secondary | ICD-10-CM | POA: Diagnosis not present

## 2022-06-28 DIAGNOSIS — R54 Age-related physical debility: Secondary | ICD-10-CM | POA: Diagnosis not present

## 2022-07-18 DIAGNOSIS — R222 Localized swelling, mass and lump, trunk: Secondary | ICD-10-CM | POA: Diagnosis not present

## 2022-07-18 DIAGNOSIS — K5900 Constipation, unspecified: Secondary | ICD-10-CM | POA: Diagnosis not present

## 2022-07-18 DIAGNOSIS — K644 Residual hemorrhoidal skin tags: Secondary | ICD-10-CM | POA: Diagnosis not present

## 2022-07-31 DIAGNOSIS — M159 Polyosteoarthritis, unspecified: Secondary | ICD-10-CM | POA: Diagnosis not present

## 2022-07-31 DIAGNOSIS — M47816 Spondylosis without myelopathy or radiculopathy, lumbar region: Secondary | ICD-10-CM | POA: Diagnosis not present

## 2022-07-31 DIAGNOSIS — F411 Generalized anxiety disorder: Secondary | ICD-10-CM | POA: Diagnosis not present

## 2022-07-31 DIAGNOSIS — I5022 Chronic systolic (congestive) heart failure: Secondary | ICD-10-CM | POA: Diagnosis not present

## 2022-07-31 DIAGNOSIS — R54 Age-related physical debility: Secondary | ICD-10-CM | POA: Diagnosis not present

## 2022-07-31 DIAGNOSIS — D638 Anemia in other chronic diseases classified elsewhere: Secondary | ICD-10-CM | POA: Diagnosis not present

## 2022-08-18 DIAGNOSIS — I5022 Chronic systolic (congestive) heart failure: Secondary | ICD-10-CM | POA: Diagnosis not present

## 2022-08-18 DIAGNOSIS — F03B11 Unspecified dementia, moderate, with agitation: Secondary | ICD-10-CM | POA: Diagnosis not present

## 2022-08-18 DIAGNOSIS — R54 Age-related physical debility: Secondary | ICD-10-CM | POA: Diagnosis not present

## 2022-08-18 DIAGNOSIS — K439 Ventral hernia without obstruction or gangrene: Secondary | ICD-10-CM | POA: Diagnosis not present

## 2022-08-18 DIAGNOSIS — M109 Gout, unspecified: Secondary | ICD-10-CM | POA: Diagnosis not present

## 2022-08-30 DIAGNOSIS — R54 Age-related physical debility: Secondary | ICD-10-CM | POA: Diagnosis not present

## 2022-08-30 DIAGNOSIS — M47816 Spondylosis without myelopathy or radiculopathy, lumbar region: Secondary | ICD-10-CM | POA: Diagnosis not present

## 2022-08-30 DIAGNOSIS — D638 Anemia in other chronic diseases classified elsewhere: Secondary | ICD-10-CM | POA: Diagnosis not present

## 2022-08-30 DIAGNOSIS — I5022 Chronic systolic (congestive) heart failure: Secondary | ICD-10-CM | POA: Diagnosis not present

## 2022-08-30 DIAGNOSIS — F411 Generalized anxiety disorder: Secondary | ICD-10-CM | POA: Diagnosis not present

## 2022-08-30 DIAGNOSIS — M159 Polyosteoarthritis, unspecified: Secondary | ICD-10-CM | POA: Diagnosis not present

## 2022-09-06 NOTE — Progress Notes (Unsigned)
Cardiology Office Note:    Date:  09/07/2022   ID:  John Rose, DOB 1923/03/28, MRN [id number]  [facility]P:  [facility]   [facility] Providers Cardiologist:  Christell Constant, MD     Referring MD: [facility]   CC: HF f/u  History of Present Illness:    John Rose is a 89 y.o. male with a hx of Dementia,  CKD stage IIIb, and HF who was found to have PACs. 2023: increased diuretics.  No changes in overal GOC.  Patient notes that he is doing well.   No chest pain or pressure .  No SOB/DOE and no PND/Orthopnea.  No weight gain or leg swelling. No pain.  Gets out of the wheelchair but is minimally active.  Family notes that he doesn't want to walk as much as recommended.  Family notes more DOE.   LE was worsened despite increase in diuretics. Has new dentures. He eats two meals a day.   Past Medical History:  Diagnosis Date   Lumbar spondylosis 11/10/2016   Testosterone deficiency     Past Surgical History:  Procedure Laterality Date   NO PAST SURGERIES      Current Medications: Current Meds  Medication Sig   acetaminophen (TYLENOL) 650 MG CR tablet Take 650 mg by mouth every 8 (eight) hours as needed for pain.   colchicine 0.6 MG tablet Take 0.6 mg by mouth at bedtime.   DICLOFENAC SODIUM-MENTHOL GEL EX Apply topically daily as needed.   furosemide (LASIX) 40 MG tablet Take 1 tablet (40 mg total) by mouth daily.   gabapentin (NEURONTIN) 300 MG capsule Take 300 mg by mouth 2 (two) times daily.   hydrocortisone (ANUSOL-HC) 2.5 % rectal cream Apply topically as needed for hemorrhoids or anal itching.   leptospermum manuka honey (MEDIHONEY) PSTE paste Apply 1 application. topically daily. Apply to right lateral lower leg wound (just below the knee) and cover with a foam dressing. Apply thin layer (3 mm) to wound.   Multiple Vitamin (MULTIVITAMIN ADULT PO) Take 1 tablet by mouth daily.   potassium chloride SA (KLOR-CON M20) 20 MEQ tablet  Take 2 tablets (40 mEq total) by mouth daily.   QUEtiapine (SEROQUEL) 25 MG tablet Take 12.5-25 mg by mouth at bedtime as needed.   simethicone (MYLICON) 80 MG chewable tablet Chew 80 mg by mouth every 6 (six) hours as needed for flatulence.   tamsulosin (FLOMAX) 0.4 MG CAPS capsule Take 0.4 mg by mouth daily.    [DISCONTINUED] furosemide (LASIX) 20 MG tablet Take 20 mg by mouth daily.   [DISCONTINUED] potassium chloride SA (KLOR-CON M) 20 MEQ tablet Take 1 tablet (20 mEq total) by mouth daily.     Allergies:   Patient has no known allergies.   Social History   Socioeconomic History   Marital status: Single    Spouse name: Not on file   Number of children: 1   Years of education: BS   Highest education level: Not on file  Occupational History   Occupation: Retired  Tobacco Use   Smoking status: Never   Smokeless tobacco: Never  Vaping Use   Vaping Use: Never used  Substance and Sexual Activity   Alcohol use: No   Drug use: No   Sexual activity: Not on file  Other Topics Concern   Not on file  Social History Narrative   Lives    Caffeine use: Tea, soda (pepsi)   Social Determinants of Health  Financial Resource Strain: Not on file  Food Insecurity: Not on file  Transportation Needs: Not on file  Physical Activity: Not on file  Stress: Not on file  Social Connections: Not on file    Family History: The patient's family history includes Hypertension in an other family member.  ROS:   Please see the history of present illness.     All other systems reviewed and are negative.  EKGs/Labs/Other Studies Reviewed:    The following studies were reviewed today:  EKG:   11/08/21: SR 74 one PAC  Cardiac Studies & Procedures       ECHOCARDIOGRAM  ECHOCARDIOGRAM COMPLETE 08/05/2021  Narrative ECHOCARDIOGRAM REPORT    Patient Name:   John Rose Date of Exam: 08/05/2021 Medical Rec #:  [id number]      Height:       68.0 in Accession #:    [id number]     Weight:        157.4 lb Date of Birth:  [birth year]       BSA:          1.846 m Patient Age:    89 years       BP:           138/75 mmHg Patient Gender: M              HR:           90 bpm. Exam Location:  Inpatient  Procedure: 2D Echo, Cardiac Doppler and Color Doppler  Indications:    Elevated troponin  History:        Patient has no prior history of Echocardiogram examinations.  Sonographer:    Cleatis Polka Referring Phys: [id number] DAVID MANUEL ORTIZ   Sonographer Comments: Suboptimal parasternal window. IMPRESSIONS   1. Left ventricular ejection fraction, by estimation, is 60 to 65%. The left ventricle has normal function. The left ventricle has no regional wall motion abnormalities. There is severe left ventricular hypertrophy. Left ventricular diastolic parameters are consistent with Grade II diastolic dysfunction (pseudonormalization). 2. Right ventricular systolic function is normal. The right ventricular size is normal. There is mildly elevated pulmonary artery systolic pressure. 3. Left atrial size was moderately dilated. 4. Right atrial size was moderately dilated. 5. The mitral valve is grossly normal. No evidence of mitral valve regurgitation. 6. The aortic valve is calcified. Aortic valve regurgitation is not visualized. Aortic valve sclerosis/calcification is present, without any evidence of aortic stenosis. 7. The inferior vena cava is normal in size with greater than 50% respiratory variability, suggesting right atrial pressure of 3 mmHg.  Comparison(s): No prior Echocardiogram.  FINDINGS Left Ventricle: Left ventricular ejection fraction, by estimation, is 60 to 65%. The left ventricle has normal function. The left ventricle has no regional wall motion abnormalities. The left ventricular internal cavity size was normal in size. There is severe left ventricular hypertrophy. Left ventricular diastolic parameters are consistent with Grade II diastolic dysfunction  (pseudonormalization).  Right Ventricle: The right ventricular size is normal. No increase in right ventricular wall thickness. Right ventricular systolic function is normal. There is mildly elevated pulmonary artery systolic pressure. The tricuspid regurgitant velocity is 2.92 m/s, and with an assumed right atrial pressure of 3 mmHg, the estimated right ventricular systolic pressure is 37.1 mmHg.  Left Atrium: Left atrial size was moderately dilated.  Right Atrium: Right atrial size was moderately dilated.  Pericardium: Trivial pericardial effusion is present.  Mitral Valve: The mitral valve is grossly normal. Mild mitral annular calcification. No  evidence of mitral valve regurgitation.  Tricuspid Valve: The tricuspid valve is normal in structure. Tricuspid valve regurgitation is mild.  Aortic Valve: The aortic valve is calcified. Aortic valve regurgitation is not visualized. Aortic valve sclerosis/calcification is present, without any evidence of aortic stenosis. Aortic valve peak gradient measures 9.0 mmHg.  Pulmonic Valve: The pulmonic valve was normal in structure. Pulmonic valve regurgitation is not visualized.  Aorta: The aortic root is normal in size and structure.  Venous: The inferior vena cava is normal in size with greater than 50% respiratory variability, suggesting right atrial pressure of 3 mmHg.  IAS/Shunts: No atrial level shunt detected by color flow Doppler.   LEFT VENTRICLE PLAX 2D LVIDd:         3.80 cm     Diastology LVIDs:         2.50 cm     LV e' medial:    5.17 cm/s LV PW:         1.40 cm     LV E/e' medial:  17.1 LV IVS:        1.00 cm     LV e' lateral:   5.08 cm/s LVOT diam:     2.00 cm     LV E/e' lateral: 17.4 LV SV:         55 LV SV Index:   30 LVOT Area:     3.14 cm  LV Volumes (MOD) LV vol d, MOD A2C: 88.0 ml LV vol d, MOD A4C: 65.5 ml LV vol s, MOD A2C: 34.1 ml LV vol s, MOD A4C: 24.2 ml LV SV MOD A2C:     53.9 ml LV SV MOD A4C:     65.5  ml LV SV MOD BP:      50.1 ml  RIGHT VENTRICLE             IVC RV Basal diam:  3.50 cm     IVC diam: 1.80 cm RV S prime:     14.30 cm/s TAPSE (M-mode): 2.1 cm  LEFT ATRIUM             Index        RIGHT ATRIUM           Index LA diam:        3.90 cm 2.11 cm/m   RA Area:     15.80 cm LA Vol (A2C):   40.0 ml 21.67 ml/m  RA Volume:   41.90 ml  22.70 ml/m LA Vol (A4C):   41.8 ml 22.64 ml/m LA Biplane Vol: 42.5 ml 23.02 ml/m AORTIC VALVE AV Area (Vmax): 2.03 cm AV Vmax:        150.00 cm/s AV Peak Grad:   9.0 mmHg LVOT Vmax:      96.70 cm/s LVOT Vmean:     69.800 cm/s LVOT VTI:       0.174 m  AORTA Ao Root diam: 3.60 cm  MITRAL VALVE               TRICUSPID VALVE MV Area (PHT): 3.53 cm    TR Peak grad:   34.1 mmHg MV Decel Time: 215 msec    TR Vmax:        292.00 cm/s MV E velocity: 88.60 cm/s MV A velocity: 75.80 cm/s  SHUNTS MV E/A ratio:  1.17        Systemic VTI:  0.17 m Systemic Diam: 2.00 cm  Carolan Clines Electronically signed by Carolan Clines Signature Date/Time: 08/05/2021/9:10:11 AM  Final             Recent Labs: 11/08/2021: B Natriuretic Peptide 918.2 12/22/2021: ALT 18; Hemoglobin 12.0; Magnesium 2.1; Platelets 96 03/14/2022: BUN 22; Creatinine, Ser 1.45; Potassium 4.8; Sodium 142  Recent Lipid Panel No results found for: "CHOL", "TRIG", "HDL", "CHOLHDL", "VLDL", "LDLCALC", "LDLDIRECT"      Physical Exam:    VS:  BP 110/78   Pulse 85   Ht 5\' 8"  (1.727 m)   Wt 161 lb (73 kg)   SpO2 96%   BMI 24.48 kg/m     Wt Readings from Last 3 Encounters:  09/07/22 161 lb (73 kg)  03/14/22 157 lb (71.2 kg)  12/16/21 171 lb 15.3 oz (78 kg)    Gen: No distress, elderly male   Neck: No JVD Cardiac: No Rubs or Gallops, no murmur, RRR +2 radial pulses Respiratory: Clear to auscultation bilaterally, normal effort, normal  respiratory rate GI: Soft, nontender, non-distended  MS: +2 bilateral LE edema moves all extremities, arms are swollen as well Neuro:  At  time of evaluation, alert and oriented to person; he is conversant but hard of hearing Psych: Normal affect, patient feels well  ASSESSMENT:    1. Acute on chronic heart failure with preserved ejection fraction (HCC)   2. Aortic atherosclerosis (HCC)   3. Mixed hyperlipidemia   4. Stage 3b chronic kidney disease (HCC)   5. PAC (premature atrial contraction)   6. Dementia, unspecified dementia severity, unspecified dementia type, unspecified whether behavioral, psychotic, or mood disturbance or anxiety (HCC)     PLAN:    Acute on Chronic HFpEF CKD stage IIIb Protein calorie malnutrition - High risk of UTI; will not add SGLT2i - will increase lasix to 40 mg PO daily and potassium to 40 meq daily - CMP, may add MRA based on this - we discussed the pros and cons of protein supplementation  PACs - asymptomatic no therapy added  Dementia - planned for conservative therapy  HLD Aortic atherosclerosis - age 29 not planned for prevention therapy   Marcelino Duster in six months me in one year        Medication Adjustments/Labs and Tests Ordered: Current medicines are reviewed at length with the patient today.  Concerns regarding medicines are outlined above.  Orders Placed This Encounter  Procedures   Comprehensive metabolic panel   Meds ordered this encounter  Medications   furosemide (LASIX) 40 MG tablet    Sig: Take 1 tablet (40 mg total) by mouth daily.    Dispense:  90 tablet    Refill:  3    Do not fill at this time   potassium chloride SA (KLOR-CON M20) 20 MEQ tablet    Sig: Take 2 tablets (40 mEq total) by mouth daily.    Dispense:  180 tablet    Refill:  3    Patient Instructions  Medication Instructions:  Your physician has recommended you make the following change in your medication:  INCREASE: furosemide (Lasix) to 40 mg by mouth once daily INCREASE: Potassium to 40 mEq by mouth once daily  *If you need a refill on your cardiac medications before your next  appointment, please call your pharmacy*   Lab Work: IN 1-2 WEEKS with PCP: CMP  If you have labs (blood work) drawn today and your tests are completely normal, you will receive your results only by: MyChart Message (if you have MyChart) OR A paper copy in the mail If you have any lab test  that is abnormal or we need to change your treatment, we will call you to review the results.   Testing/Procedures: NONE   Follow-Up: At [facility], you and your health needs are our priority.  As part of our continuing mission to provide you with exceptional heart care, we have created designated Provider Care Teams.  These Care Teams include your primary Cardiologist (physician) and Advanced Practice Providers (APPs -  Physician Assistants and Nurse Practitioners) who all work together to provide you with the care you need, when you need it.  We recommend signing up for the patient portal called "MyChart".  Sign up information is provided on this After Visit Summary.  MyChart is used to connect with patients for Virtual Visits (Telemedicine).  Patients are able to view lab/test results, encounter notes, upcoming appointments, etc.  Non-urgent messages can be sent to your provider as well.   To learn more about what you can do with MyChart, go to [email or web address].    Your next appointment:   6 month(s)  Provider:   Eligha Bridegroom, NP     Then, Christell Constant, MD will plan to see you again in 1 year(s).      Signed, Christell Constant, MD  09/07/2022 1:49 PM    [facility]

## 2022-09-07 ENCOUNTER — Ambulatory Visit: Payer: Medicare Other | Attending: Internal Medicine | Admitting: Internal Medicine

## 2022-09-07 ENCOUNTER — Encounter: Payer: Self-pay | Admitting: Internal Medicine

## 2022-09-07 ENCOUNTER — Other Ambulatory Visit: Payer: Self-pay

## 2022-09-07 VITALS — BP 110/78 | HR 85 | Ht 68.0 in | Wt 161.0 lb

## 2022-09-07 DIAGNOSIS — R600 Localized edema: Secondary | ICD-10-CM | POA: Diagnosis not present

## 2022-09-07 DIAGNOSIS — I700 Atherosclerosis of aorta: Secondary | ICD-10-CM | POA: Diagnosis not present

## 2022-09-07 DIAGNOSIS — E782 Mixed hyperlipidemia: Secondary | ICD-10-CM | POA: Insufficient documentation

## 2022-09-07 DIAGNOSIS — N1832 Chronic kidney disease, stage 3b: Secondary | ICD-10-CM | POA: Diagnosis not present

## 2022-09-07 DIAGNOSIS — I491 Atrial premature depolarization: Secondary | ICD-10-CM | POA: Insufficient documentation

## 2022-09-07 DIAGNOSIS — F0390 Unspecified dementia without behavioral disturbance: Secondary | ICD-10-CM | POA: Insufficient documentation

## 2022-09-07 DIAGNOSIS — I5033 Acute on chronic diastolic (congestive) heart failure: Secondary | ICD-10-CM | POA: Diagnosis not present

## 2022-09-07 MED ORDER — POTASSIUM CHLORIDE CRYS ER 20 MEQ PO TBCR: 40.0000 meq | EXTENDED_RELEASE_TABLET | Freq: Every day | ORAL | 3 refills | Status: DC

## 2022-09-07 MED ORDER — FUROSEMIDE 40 MG PO TABS: 40.0000 mg | ORAL_TABLET | Freq: Every day | ORAL | 3 refills | Status: DC

## 2022-09-07 NOTE — Patient Instructions (Signed)
Medication Instructions:  Your physician has recommended you make the following change in your medication:  INCREASE: furosemide (Lasix) to 40 mg by mouth once daily INCREASE: Potassium to 40 mEq by mouth once daily  *If you need a refill on your cardiac medications before your next appointment, please call your pharmacy*   Lab Work: IN 1-2 WEEKS with PCP: CMP  If you have labs (blood work) drawn today and your tests are completely normal, you will receive your results only by: MyChart Message (if you have MyChart) OR A paper copy in the mail If you have any lab test that is abnormal or we need to change your treatment, we will call you to review the results.   Testing/Procedures: NONE   Follow-Up: At [facility], you and your health needs are our priority.  As part of our continuing mission to provide you with exceptional heart care, we have created designated Provider Care Teams.  These Care Teams include your primary Cardiologist (physician) and Advanced Practice Providers (APPs -  Physician Assistants and Nurse Practitioners) who all work together to provide you with the care you need, when you need it.  We recommend signing up for the patient portal called "MyChart".  Sign up information is provided on this After Visit Summary.  MyChart is used to connect with patients for Virtual Visits (Telemedicine).  Patients are able to view lab/test results, encounter notes, upcoming appointments, etc.  Non-urgent messages can be sent to your provider as well.   To learn more about what you can do with MyChart, go to [email or web address].    Your next appointment:   6 month(s)  Provider:   Eligha Bridegroom, NP     Then, Christell Constant, MD will plan to see you again in 1 year(s).

## 2022-09-11 ENCOUNTER — Telehealth: Payer: Self-pay | Admitting: Internal Medicine

## 2022-09-11 NOTE — Telephone Encounter (Signed)
Patient needs referral/order put in for: Skilled nursing -CHF and medicine management at [facility]. Please call Audrea Muscat with [facility] for any questions. Callback # [phone].   Phone number # [phone]  Fax # [phone]

## 2022-09-12 ENCOUNTER — Ambulatory Visit: Payer: Medicare Other | Admitting: Internal Medicine

## 2022-09-12 NOTE — Telephone Encounter (Signed)
Lynette from [facility] calling back for an update about this order.

## 2022-09-12 NOTE — Telephone Encounter (Signed)
Called Lynette with [facility] advised that Cardiologist does not place orders for Home Health.  Advised to call PCP for orders.  She had no further questions or concerns.

## 2022-09-21 DIAGNOSIS — Z9181 History of falling: Secondary | ICD-10-CM | POA: Diagnosis not present

## 2022-09-21 DIAGNOSIS — R54 Age-related physical debility: Secondary | ICD-10-CM | POA: Diagnosis not present

## 2022-09-21 DIAGNOSIS — N1832 Chronic kidney disease, stage 3b: Secondary | ICD-10-CM | POA: Diagnosis not present

## 2022-09-21 DIAGNOSIS — R451 Restlessness and agitation: Secondary | ICD-10-CM | POA: Diagnosis not present

## 2022-09-21 LAB — LAB REPORT - SCANNED: EGFR: 32

## 2022-09-26 DIAGNOSIS — M47816 Spondylosis without myelopathy or radiculopathy, lumbar region: Secondary | ICD-10-CM | POA: Diagnosis not present

## 2022-09-26 DIAGNOSIS — D638 Anemia in other chronic diseases classified elsewhere: Secondary | ICD-10-CM | POA: Diagnosis not present

## 2022-09-26 DIAGNOSIS — M159 Polyosteoarthritis, unspecified: Secondary | ICD-10-CM | POA: Diagnosis not present

## 2022-09-26 DIAGNOSIS — F411 Generalized anxiety disorder: Secondary | ICD-10-CM | POA: Diagnosis not present

## 2022-09-26 DIAGNOSIS — R54 Age-related physical debility: Secondary | ICD-10-CM | POA: Diagnosis not present

## 2022-09-26 DIAGNOSIS — I5022 Chronic systolic (congestive) heart failure: Secondary | ICD-10-CM | POA: Diagnosis not present

## 2022-10-16 DIAGNOSIS — F03B11 Unspecified dementia, moderate, with agitation: Secondary | ICD-10-CM | POA: Diagnosis not present

## 2022-10-16 DIAGNOSIS — R54 Age-related physical debility: Secondary | ICD-10-CM | POA: Diagnosis not present

## 2022-10-16 DIAGNOSIS — I5022 Chronic systolic (congestive) heart failure: Secondary | ICD-10-CM | POA: Diagnosis not present

## 2022-10-16 DIAGNOSIS — K439 Ventral hernia without obstruction or gangrene: Secondary | ICD-10-CM | POA: Diagnosis not present

## 2022-10-16 DIAGNOSIS — M159 Polyosteoarthritis, unspecified: Secondary | ICD-10-CM | POA: Diagnosis not present

## 2022-10-24 DIAGNOSIS — D638 Anemia in other chronic diseases classified elsewhere: Secondary | ICD-10-CM | POA: Diagnosis not present

## 2022-10-24 DIAGNOSIS — M159 Polyosteoarthritis, unspecified: Secondary | ICD-10-CM | POA: Diagnosis not present

## 2022-10-24 DIAGNOSIS — F411 Generalized anxiety disorder: Secondary | ICD-10-CM | POA: Diagnosis not present

## 2022-10-24 DIAGNOSIS — R54 Age-related physical debility: Secondary | ICD-10-CM | POA: Diagnosis not present

## 2022-10-24 DIAGNOSIS — M47816 Spondylosis without myelopathy or radiculopathy, lumbar region: Secondary | ICD-10-CM | POA: Diagnosis not present

## 2022-10-24 DIAGNOSIS — I5022 Chronic systolic (congestive) heart failure: Secondary | ICD-10-CM | POA: Diagnosis not present

## 2022-11-16 DIAGNOSIS — M1A9XX0 Chronic gout, unspecified, without tophus (tophi): Secondary | ICD-10-CM | POA: Diagnosis not present

## 2022-11-16 DIAGNOSIS — N1832 Chronic kidney disease, stage 3b: Secondary | ICD-10-CM | POA: Diagnosis not present

## 2022-11-16 DIAGNOSIS — D638 Anemia in other chronic diseases classified elsewhere: Secondary | ICD-10-CM | POA: Diagnosis not present

## 2022-11-16 DIAGNOSIS — N400 Enlarged prostate without lower urinary tract symptoms: Secondary | ICD-10-CM | POA: Diagnosis not present

## 2022-11-16 DIAGNOSIS — G6181 Chronic inflammatory demyelinating polyneuritis: Secondary | ICD-10-CM | POA: Diagnosis not present

## 2022-11-21 DIAGNOSIS — M159 Polyosteoarthritis, unspecified: Secondary | ICD-10-CM | POA: Diagnosis not present

## 2022-11-21 DIAGNOSIS — D638 Anemia in other chronic diseases classified elsewhere: Secondary | ICD-10-CM | POA: Diagnosis not present

## 2022-11-21 DIAGNOSIS — M47816 Spondylosis without myelopathy or radiculopathy, lumbar region: Secondary | ICD-10-CM | POA: Diagnosis not present

## 2022-11-21 DIAGNOSIS — I5022 Chronic systolic (congestive) heart failure: Secondary | ICD-10-CM | POA: Diagnosis not present

## 2022-11-21 DIAGNOSIS — R54 Age-related physical debility: Secondary | ICD-10-CM | POA: Diagnosis not present

## 2022-11-21 DIAGNOSIS — F411 Generalized anxiety disorder: Secondary | ICD-10-CM | POA: Diagnosis not present

## 2022-12-10 ENCOUNTER — Emergency Department (HOSPITAL_COMMUNITY): Payer: Medicare Other

## 2022-12-10 ENCOUNTER — Inpatient Hospital Stay (HOSPITAL_COMMUNITY)
Admission: EM | Admit: 2022-12-10 | Discharge: 2022-12-19 | DRG: 291 | Disposition: A | Discharge status: Skilled Nursing Facility | Payer: Medicare Other | Attending: Internal Medicine | Admitting: Internal Medicine

## 2022-12-10 ENCOUNTER — Other Ambulatory Visit: Payer: Self-pay

## 2022-12-10 DIAGNOSIS — J9601 Acute respiratory failure with hypoxia: Secondary | ICD-10-CM | POA: Diagnosis not present

## 2022-12-10 DIAGNOSIS — I5043 Acute on chronic combined systolic (congestive) and diastolic (congestive) heart failure: Principal | ICD-10-CM | POA: Diagnosis present

## 2022-12-10 DIAGNOSIS — N400 Enlarged prostate without lower urinary tract symptoms: Secondary | ICD-10-CM | POA: Diagnosis present

## 2022-12-10 DIAGNOSIS — E538 Deficiency of other specified B group vitamins: Secondary | ICD-10-CM | POA: Diagnosis not present

## 2022-12-10 DIAGNOSIS — I5022 Chronic systolic (congestive) heart failure: Secondary | ICD-10-CM | POA: Diagnosis not present

## 2022-12-10 DIAGNOSIS — F411 Generalized anxiety disorder: Secondary | ICD-10-CM | POA: Diagnosis not present

## 2022-12-10 DIAGNOSIS — I3139 Other pericardial effusion (noninflammatory): Secondary | ICD-10-CM | POA: Diagnosis present

## 2022-12-10 DIAGNOSIS — Z23 Encounter for immunization: Secondary | ICD-10-CM

## 2022-12-10 DIAGNOSIS — I517 Cardiomegaly: Secondary | ICD-10-CM | POA: Diagnosis not present

## 2022-12-10 DIAGNOSIS — Z515 Encounter for palliative care: Secondary | ICD-10-CM

## 2022-12-10 DIAGNOSIS — F0150 Vascular dementia without behavioral disturbance: Secondary | ICD-10-CM | POA: Diagnosis not present

## 2022-12-10 DIAGNOSIS — I2510 Atherosclerotic heart disease of native coronary artery without angina pectoris: Secondary | ICD-10-CM | POA: Diagnosis not present

## 2022-12-10 DIAGNOSIS — J9811 Atelectasis: Secondary | ICD-10-CM | POA: Diagnosis present

## 2022-12-10 DIAGNOSIS — N179 Acute kidney failure, unspecified: Secondary | ICD-10-CM | POA: Diagnosis not present

## 2022-12-10 DIAGNOSIS — R2681 Unsteadiness on feet: Secondary | ICD-10-CM | POA: Diagnosis not present

## 2022-12-10 DIAGNOSIS — D696 Thrombocytopenia, unspecified: Secondary | ICD-10-CM | POA: Diagnosis not present

## 2022-12-10 DIAGNOSIS — Z8249 Family history of ischemic heart disease and other diseases of the circulatory system: Secondary | ICD-10-CM

## 2022-12-10 DIAGNOSIS — D638 Anemia in other chronic diseases classified elsewhere: Secondary | ICD-10-CM | POA: Diagnosis not present

## 2022-12-10 DIAGNOSIS — J90 Pleural effusion, not elsewhere classified: Secondary | ICD-10-CM | POA: Diagnosis not present

## 2022-12-10 DIAGNOSIS — I509 Heart failure, unspecified: Secondary | ICD-10-CM | POA: Diagnosis not present

## 2022-12-10 DIAGNOSIS — F03911 Unspecified dementia, unspecified severity, with agitation: Secondary | ICD-10-CM | POA: Diagnosis not present

## 2022-12-10 DIAGNOSIS — E291 Testicular hypofunction: Secondary | ICD-10-CM | POA: Diagnosis not present

## 2022-12-10 DIAGNOSIS — N1832 Chronic kidney disease, stage 3b: Secondary | ICD-10-CM | POA: Diagnosis present

## 2022-12-10 DIAGNOSIS — Z79899 Other long term (current) drug therapy: Secondary | ICD-10-CM | POA: Diagnosis not present

## 2022-12-10 DIAGNOSIS — I5033 Acute on chronic diastolic (congestive) heart failure: Secondary | ICD-10-CM | POA: Diagnosis not present

## 2022-12-10 DIAGNOSIS — I959 Hypotension, unspecified: Secondary | ICD-10-CM | POA: Diagnosis not present

## 2022-12-10 DIAGNOSIS — Z7401 Bed confinement status: Secondary | ICD-10-CM | POA: Diagnosis not present

## 2022-12-10 DIAGNOSIS — I700 Atherosclerosis of aorta: Secondary | ICD-10-CM | POA: Diagnosis not present

## 2022-12-10 DIAGNOSIS — I5020 Unspecified systolic (congestive) heart failure: Principal | ICD-10-CM

## 2022-12-10 DIAGNOSIS — R918 Other nonspecific abnormal finding of lung field: Secondary | ICD-10-CM | POA: Diagnosis not present

## 2022-12-10 DIAGNOSIS — Z7189 Other specified counseling: Secondary | ICD-10-CM | POA: Diagnosis not present

## 2022-12-10 DIAGNOSIS — I2489 Other forms of acute ischemic heart disease: Secondary | ICD-10-CM | POA: Diagnosis not present

## 2022-12-10 DIAGNOSIS — R0602 Shortness of breath: Secondary | ICD-10-CM | POA: Diagnosis not present

## 2022-12-10 DIAGNOSIS — E782 Mixed hyperlipidemia: Secondary | ICD-10-CM | POA: Diagnosis present

## 2022-12-10 DIAGNOSIS — Z66 Do not resuscitate: Secondary | ICD-10-CM | POA: Diagnosis present

## 2022-12-10 DIAGNOSIS — R54 Age-related physical debility: Secondary | ICD-10-CM | POA: Diagnosis not present

## 2022-12-10 DIAGNOSIS — R059 Cough, unspecified: Secondary | ICD-10-CM | POA: Diagnosis not present

## 2022-12-10 DIAGNOSIS — E875 Hyperkalemia: Secondary | ICD-10-CM | POA: Diagnosis not present

## 2022-12-10 DIAGNOSIS — L84 Corns and callosities: Secondary | ICD-10-CM | POA: Diagnosis not present

## 2022-12-10 DIAGNOSIS — R0689 Other abnormalities of breathing: Secondary | ICD-10-CM | POA: Diagnosis not present

## 2022-12-10 DIAGNOSIS — R0989 Other specified symptoms and signs involving the circulatory and respiratory systems: Secondary | ICD-10-CM | POA: Diagnosis not present

## 2022-12-10 DIAGNOSIS — M47816 Spondylosis without myelopathy or radiculopathy, lumbar region: Secondary | ICD-10-CM | POA: Diagnosis not present

## 2022-12-10 DIAGNOSIS — I10 Essential (primary) hypertension: Secondary | ICD-10-CM | POA: Diagnosis not present

## 2022-12-10 DIAGNOSIS — M6281 Muscle weakness (generalized): Secondary | ICD-10-CM | POA: Diagnosis not present

## 2022-12-10 DIAGNOSIS — B353 Tinea pedis: Secondary | ICD-10-CM | POA: Diagnosis not present

## 2022-12-10 DIAGNOSIS — R531 Weakness: Secondary | ICD-10-CM | POA: Diagnosis not present

## 2022-12-10 DIAGNOSIS — I110 Hypertensive heart disease with heart failure: Secondary | ICD-10-CM | POA: Diagnosis not present

## 2022-12-10 DIAGNOSIS — D72819 Decreased white blood cell count, unspecified: Secondary | ICD-10-CM | POA: Diagnosis not present

## 2022-12-10 DIAGNOSIS — Z1152 Encounter for screening for COVID-19: Secondary | ICD-10-CM | POA: Diagnosis not present

## 2022-12-10 DIAGNOSIS — M159 Polyosteoarthritis, unspecified: Secondary | ICD-10-CM | POA: Diagnosis not present

## 2022-12-10 DIAGNOSIS — E042 Nontoxic multinodular goiter: Secondary | ICD-10-CM | POA: Diagnosis not present

## 2022-12-10 DIAGNOSIS — F0390 Unspecified dementia without behavioral disturbance: Secondary | ICD-10-CM | POA: Diagnosis not present

## 2022-12-10 DIAGNOSIS — I358 Other nonrheumatic aortic valve disorders: Secondary | ICD-10-CM | POA: Diagnosis not present

## 2022-12-10 DIAGNOSIS — R0902 Hypoxemia: Secondary | ICD-10-CM | POA: Diagnosis not present

## 2022-12-10 DIAGNOSIS — R1312 Dysphagia, oropharyngeal phase: Secondary | ICD-10-CM | POA: Diagnosis not present

## 2022-12-10 LAB — CBC WITH DIFFERENTIAL/PLATELET
Abs Immature Granulocytes: 0.01 10*3/uL (ref 0.00–0.07)
Basophils Absolute: 0 10*3/uL (ref 0.0–0.1)
Basophils Relative: 0 %
Eosinophils Absolute: 0 10*3/uL (ref 0.0–0.5)
Eosinophils Relative: 2 %
HCT: 41 % (ref 39.0–52.0)
Hemoglobin: 12.7 g/dL — ABNORMAL LOW (ref 13.0–17.0)
Immature Granulocytes: 0 %
Lymphocytes Relative: 28 %
Lymphs Abs: 0.8 10*3/uL (ref 0.7–4.0)
MCH: 32.8 pg (ref 26.0–34.0)
MCHC: 31 g/dL (ref 30.0–36.0)
MCV: 105.9 fL — ABNORMAL HIGH (ref 80.0–100.0)
Monocytes Absolute: 0.3 10*3/uL (ref 0.1–1.0)
Monocytes Relative: 11 %
Neutro Abs: 1.6 10*3/uL — ABNORMAL LOW (ref 1.7–7.7)
Neutrophils Relative %: 59 %
Platelets: 85 10*3/uL — ABNORMAL LOW (ref 150–400)
RBC: 3.87 MIL/uL — ABNORMAL LOW (ref 4.22–5.81)
RDW: 14.4 % (ref 11.5–15.5)
WBC: 2.7 10*3/uL — ABNORMAL LOW (ref 4.0–10.5)
nRBC: 0 % (ref 0.0–0.2)

## 2022-12-10 LAB — COMPREHENSIVE METABOLIC PANEL
ALT: 35 U/L (ref 0–44)
AST: 37 U/L (ref 15–41)
Albumin: 3.9 g/dL (ref 3.5–5.0)
Alkaline Phosphatase: 79 U/L (ref 38–126)
Anion gap: 7 (ref 5–15)
BUN: 36 mg/dL — ABNORMAL HIGH (ref 8–23)
CO2: 26 mmol/L (ref 22–32)
Calcium: 9.2 mg/dL (ref 8.9–10.3)
Chloride: 109 mmol/L (ref 98–111)
Creatinine, Ser: 1.73 mg/dL — ABNORMAL HIGH (ref 0.61–1.24)
GFR, Estimated: 35 mL/min — ABNORMAL LOW (ref >60)
Glucose, Bld: 88 mg/dL (ref 70–99)
Potassium: 5.3 mmol/L — ABNORMAL HIGH (ref 3.5–5.1)
Sodium: 142 mmol/L (ref 135–145)
Total Bilirubin: 0.6 mg/dL (ref 0.3–1.2)
Total Protein: 7.1 g/dL (ref 6.5–8.1)

## 2022-12-10 LAB — RESP PANEL BY RT-PCR (RSV, FLU A&B, COVID)  RVPGX2
Influenza A by PCR: NEGATIVE
Influenza B by PCR: NEGATIVE
Resp Syncytial Virus by PCR: NEGATIVE
SARS Coronavirus 2 by RT PCR: NEGATIVE

## 2022-12-10 LAB — BRAIN NATRIURETIC PEPTIDE: B Natriuretic Peptide: 2688.8 pg/mL — ABNORMAL HIGH (ref 0.0–100.0)

## 2022-12-10 LAB — TROPONIN I (HIGH SENSITIVITY): Troponin I (High Sensitivity): 63 ng/L — ABNORMAL HIGH (ref <18)

## 2022-12-10 MED ORDER — SODIUM CHLORIDE 0.9 % IV SOLN
3.0000 g | Freq: Two times a day (BID) | INTRAVENOUS | Status: DC
  Administered 2022-12-11: 3 g via INTRAVENOUS
  Filled 2022-12-10: qty 8

## 2022-12-10 MED ORDER — FUROSEMIDE 10 MG/ML IJ SOLN
40.0000 mg | Freq: Once | INTRAMUSCULAR | Status: AC
  Administered 2022-12-10: 40 mg via INTRAVENOUS
  Filled 2022-12-10: qty 4

## 2022-12-10 MED ORDER — ENOXAPARIN SODIUM 30 MG/0.3ML IJ SOSY: 30.0000 mg | PREFILLED_SYRINGE | INTRAMUSCULAR | Status: DC

## 2022-12-10 MED ORDER — FUROSEMIDE 10 MG/ML IJ SOLN
20.0000 mg | Freq: Two times a day (BID) | INTRAMUSCULAR | Status: DC
  Filled 2022-12-10: qty 2

## 2022-12-10 MED ORDER — TAMSULOSIN HCL 0.4 MG PO CAPS
0.4000 mg | ORAL_CAPSULE | Freq: Every day | ORAL | Status: DC
  Administered 2022-12-11 – 2022-12-19 (×9): 0.4 mg via ORAL
  Filled 2022-12-10 (×9): qty 1

## 2022-12-10 NOTE — ED Notes (Signed)
ED TO INPATIENT HANDOFF REPORT  ED Nurse Name and Phone #: Labrittany Wechter o  S Name/Age/Gender John Rose 89 y.o. male Room/Bed: WA10/WA10  Code Status   Code Status: Full Code  Home/SNF/Other Home Patient oriented to: self and place Is this baseline? Yes   Hx of dementia, able to answer some questions correctly, son has been at bedside to provide information Triage Complete: Triage complete  Chief Complaint Acute on chronic diastolic (congestive) heart failure (HCC) [I50.33]  Triage Note Patient brought in by EMS from home with c/o cough. Per EMS patient cough started today along with running nose. He has Dementia and is A&Ox2. 142/96 97% RA 88 98.9 oral CBG: 170   Allergies No Known Allergies  Level of Care/Admitting Diagnosis ED Disposition     ED Disposition  Admit   Condition  --   Comment  Hospital Area: [facility] [[id number]]  Level of Care: Telemetry [5]  Admit to tele based on following criteria: Monitor for Ischemic changes  May admit patient to [facility] or [facility] if equivalent level of care is available:: Yes  Covid Evaluation: Asymptomatic - no recent exposure (last 10 days) testing not required  Diagnosis: Acute on chronic diastolic (congestive) heart failure [facility]) [[id number]]  Admitting Physician: Darlin Drop [[id number]]  Attending Physician: Darlin Drop [[id number]]  Certification:: I certify this patient will need inpatient services for at least 2 midnights  Expected Medical Readiness: 12/12/2022          B Medical/Surgery History Past Medical History:  Diagnosis Date   Lumbar spondylosis 11/10/2016   Testosterone deficiency    Past Surgical History:  Procedure Laterality Date   NO PAST SURGERIES       A IV Location/Drains/Wounds Patient Lines/Drains/Airways Status     Active Line/Drains/Airways     Name Placement date Placement time Site Days   Wound / Incision (Open or Dehisced) 08/04/21 Non-pressure  wound Pretibial Right;Lateral 08/04/21  2030  Pretibial  493            Intake/Output Last 24 hours No intake or output data in the 24 hours ending 12/10/22 2315  Labs/Imaging Results for orders placed or performed during the hospital encounter of 12/10/22 (from the past 48 hour(s))  Resp panel by RT-PCR (RSV, Flu A&B, Covid) Anterior Nasal Swab     Status: None   Collection Time: 12/10/22  6:26 PM   Specimen: Anterior Nasal Swab  Result Value Ref Range   SARS Coronavirus 2 by RT PCR NEGATIVE NEGATIVE    Comment: (NOTE) SARS-CoV-2 target nucleic acids are NOT DETECTED.  The SARS-CoV-2 RNA is generally detectable in upper respiratory specimens during the acute phase of infection. The lowest concentration of SARS-CoV-2 viral copies this assay can detect is 138 copies/mL. A negative result does not preclude SARS-Cov-2 infection and should not be used as the sole basis for treatment or other patient management decisions. A negative result may occur with  improper specimen collection/handling, submission of specimen other than nasopharyngeal swab, presence of viral mutation(s) within the areas targeted by this assay, and inadequate number of viral copies(<138 copies/mL). A negative result must be combined with clinical observations, patient history, and epidemiological information. The expected result is Negative.  Fact Sheet for Patients:  BloggerCourse.com  Fact Sheet for Healthcare Providers:  SeriousBroker.it  This test is no t yet approved or cleared by the [place] FDA and  has been authorized for detection and/or diagnosis of SARS-CoV-2 by FDA under  an Emergency Use Authorization (EUA). This EUA will remain  in effect (meaning this test can be used) for the duration of the COVID-19 declaration under Section 564(b)(1) of the Act, 21 U.S.C.section 360bbb-3(b)(1), unless the authorization is terminated  or revoked  sooner.       Influenza A by PCR NEGATIVE NEGATIVE   Influenza B by PCR NEGATIVE NEGATIVE    Comment: (NOTE) The Xpert Xpress SARS-CoV-2/FLU/RSV plus assay is intended as an aid in the diagnosis of influenza from Nasopharyngeal swab specimens and should not be used as a sole basis for treatment. Nasal washings and aspirates are unacceptable for Xpert Xpress SARS-CoV-2/FLU/RSV testing.  Fact Sheet for Patients: BloggerCourse.com  Fact Sheet for Healthcare Providers: SeriousBroker.it  This test is not yet approved or cleared by the [place] FDA and has been authorized for detection and/or diagnosis of SARS-CoV-2 by FDA under an Emergency Use Authorization (EUA). This EUA will remain in effect (meaning this test can be used) for the duration of the COVID-19 declaration under Section 564(b)(1) of the Act, 21 U.S.C. section 360bbb-3(b)(1), unless the authorization is terminated or revoked.     Resp Syncytial Virus by PCR NEGATIVE NEGATIVE    Comment: (NOTE) Fact Sheet for Patients: BloggerCourse.com  Fact Sheet for Healthcare Providers: SeriousBroker.it  This test is not yet approved or cleared by the [place] FDA and has been authorized for detection and/or diagnosis of SARS-CoV-2 by FDA under an Emergency Use Authorization (EUA). This EUA will remain in effect (meaning this test can be used) for the duration of the COVID-19 declaration under Section 564(b)(1) of the Act, 21 U.S.C. section 360bbb-3(b)(1), unless the authorization is terminated or revoked.  Performed at [facility], [address], [place], [place] [address]   CBC with Differential     Status: Abnormal   Collection Time: 12/10/22  8:47 PM  Result Value Ref Range   WBC 2.7 (L) 4.0 - 10.5 K/uL   RBC 3.87 (L) 4.22 - 5.81 MIL/uL   Hemoglobin 12.7 (L) 13.0 - 17.0 g/dL   HCT 44.0  34.7 - 42.5 %   MCV 105.9 (H) 80.0 - 100.0 fL   MCH 32.8 26.0 - 34.0 pg   MCHC 31.0 30.0 - 36.0 g/dL   RDW 95.6 38.7 - 56.4 %   Platelets 85 (L) 150 - 400 K/uL    Comment: SPECIMEN CHECKED FOR CLOTS Immature Platelet Fraction may be clinically indicated, consider ordering this additional test [id number] CONSISTENT WITH PREVIOUS RESULT REPEATED TO VERIFY    nRBC 0.0 0.0 - 0.2 %   Neutrophils Relative % 59 %   Neutro Abs 1.6 (L) 1.7 - 7.7 K/uL   Lymphocytes Relative 28 %   Lymphs Abs 0.8 0.7 - 4.0 K/uL   Monocytes Relative 11 %   Monocytes Absolute 0.3 0.1 - 1.0 K/uL   Eosinophils Relative 2 %   Eosinophils Absolute 0.0 0.0 - 0.5 K/uL   Basophils Relative 0 %   Basophils Absolute 0.0 0.0 - 0.1 K/uL   Immature Granulocytes 0 %   Abs Immature Granulocytes 0.01 0.00 - 0.07 K/uL    Comment: Performed at [facility], [address], [place], [place] [address]  Comprehensive metabolic panel     Status: Abnormal   Collection Time: 12/10/22  8:47 PM  Result Value Ref Range   Sodium 142 135 - 145 mmol/L   Potassium 5.3 (H) 3.5 - 5.1 mmol/L   Chloride 109 98 - 111 mmol/L  CO2 26 22 - 32 mmol/L   Glucose, Bld 88 70 - 99 mg/dL    Comment: Glucose reference range applies only to samples taken after fasting for at least 8 hours.   BUN 36 (H) 8 - 23 mg/dL   Creatinine, Ser 1.61 (H) 0.61 - 1.24 mg/dL   Calcium 9.2 8.9 - 09.6 mg/dL   Total Protein 7.1 6.5 - 8.1 g/dL   Albumin 3.9 3.5 - 5.0 g/dL   AST 37 15 - 41 U/L   ALT 35 0 - 44 U/L   Alkaline Phosphatase 79 38 - 126 U/L   Total Bilirubin 0.6 0.3 - 1.2 mg/dL   GFR, Estimated 35 (L) >60 mL/min    Comment: (NOTE) Calculated using the CKD-EPI Creatinine Equation (2021)    Anion gap 7 5 - 15    Comment: Performed at [facility], [address], [place], [place] [address]  Brain natriuretic peptide     Status: Abnormal   Collection Time: 12/10/22  8:47 PM  Result Value Ref Range   B Natriuretic  Peptide 2,688.8 (H) 0.0 - 100.0 pg/mL    Comment: Performed at [facility], [address], [place], [place] [address]  Troponin I (High Sensitivity)     Status: Abnormal   Collection Time: 12/10/22  8:47 PM  Result Value Ref Range   Troponin I (High Sensitivity) 63 (H) <18 ng/L    Comment: (NOTE) Elevated high sensitivity troponin I (hsTnI) values and significant  changes across serial measurements may suggest ACS but many other  chronic and acute conditions are known to elevate hsTnI results.  Refer to the "Links" section for chest pain algorithms and additional  guidance. Performed at [facility], [address], [place], [place] [address]    CT Chest Wo Contrast  Result Date: 12/10/2022 CLINICAL DATA:  Cough x8 weeks with failed empiric treatment. History of dementia. EXAM: CT CHEST WITHOUT CONTRAST TECHNIQUE: Multidetector CT imaging of the chest was performed following the standard protocol without IV contrast. RADIATION DOSE REDUCTION: This exam was performed according to the departmental dose-optimization program which includes automated exposure control, adjustment of the mA and/or kV according to patient size and/or use of iterative reconstruction technique. COMPARISON:  AP Lat chest today, portable chest 12/22/2021, CTA chest 08/04/2021. FINDINGS: Cardiovascular: There is mild-to-moderate panchamber cardiomegaly. There are three-vessel coronary artery calcifications, scattered calcifications across the aortic valve leaflets and moderate calcific plaques of the thoracic aorta without aneurysm. Scattered plaque disease in the great vessels. The pulmonary trunk is slightly prominent but unchanged. Small pericardial effusion is again noted anteriorly. The central veins are mildly prominent. Mediastinum/Nodes: Multinodular goiter again noted. Largest individual nodule again measures 3.5 cm in the left lower pole displacing the trachea to the right,  unchanged. Given advanced age and comorbidities with limited life expectancy, no follow-up imaging is recommended at this time. No intrathoracic or axillary adenopathy is seen. The thoracic esophagus is unremarkable. There is minimal mucoid debris in the posterior trachea. The main bronchi are clear. Lungs/Pleura: There is a moderate right and small to moderate-sized left layering pleural effusions. There is compressive atelectasis or consolidation in the right lower lobe, more linear compressive atelectatic effect in the posterior left lower lobe. There are no findings of subpleural interstitial edema. There is additional posterior linear atelectasis in the right middle lobe. There is new linear scarring or atelectasis laterally in the lingula. Mild diffuse bronchial thickening. The aerated lungs not obscured by  the pleural effusions do not show evidence of a focal infiltrate. There is a calcified left upper lobe granuloma. Upper Abdomen: 5 cm uncomplicated cyst noted in hepatic segment 2, smaller cyst to the right of this both stable. There is a 1.4 cm Bosniak 1 cyst in the upper pole of the left kidney. There is mild upper abdominal ascites. There appears to be increased intrahepatic biliary dilatation. Laboratory and clinical correlation recommended. No other significant upper abdominal findings. Abdominal aortic atherosclerosis. Musculoskeletal: There is thoracic kyphosis without spinal compression fracture. Multilevel spinal bridging enthesopathy. No suspicious bone lesions. The ribcage is intact. IMPRESSION: 1. Moderate right and small to moderate left layering pleural effusions with compressive atelectasis or consolidation in the right lower lobe. 2. Mild diffuse bronchial thickening which could be due to bronchitis or reactive airways disease. 3. Cardiomegaly with mildly prominent central veins and small pericardial effusion. No findings of interstitial edema. 4. Aortic and coronary artery atherosclerosis.  5. Increased intrahepatic biliary dilatation. Laboratory and clinical correlation recommended. 6. Upper abdominal ascites. 7. Multinodular goiter. No follow-up imaging recommended due to advanced age and comorbidities. Aortic Atherosclerosis (ICD10-I70.0). Electronically Signed   By: Almira Bar M.D.   On: 12/10/2022 21:06   DG Chest 2 View  Result Date: 12/10/2022 CLINICAL DATA:  Cough EXAM: CHEST - 2 VIEW COMPARISON:  12/22/2021 FINDINGS: Enlarged cardiac silhouette. Pulmonary vascular congestion. Moderate bilateral pleural effusions, right greater than left. Associated bibasilar opacities. No pneumothorax. IMPRESSION: Cardiomegaly with pulmonary vascular congestion and moderate bilateral pleural effusions, right greater than left. Associated bibasilar opacities, which may represent atelectasis or infection. Electronically Signed   By: Duanne Guess D.O.   On: 12/10/2022 18:56    Pending Labs Unresulted Labs (From admission, onward)     Start     Ordered   12/17/22 0500  Creatinine, serum  (enoxaparin (LOVENOX)    CrCl >/= 30 ml/min)  Weekly,   R     Comments: while on enoxaparin therapy    12/10/22 2315            Vitals/Pain Today's Vitals   12/10/22 1720 12/10/22 2048 12/10/22 2100 12/10/22 2300  BP:   116/83 118/80  Pulse:      Resp:   15 14  Temp:  97.7 F (36.5 C)    TempSrc:  Oral    SpO2:      Weight: 73 kg     Height: 5\' 8"  (1.727 m)     PainSc: 0-No pain       Isolation Precautions No active isolations  Medications Medications  furosemide (LASIX) injection 40 mg (has no administration in time range)  enoxaparin (LOVENOX) injection 40 mg (has no administration in time range)    Mobility walks with device     Focused Assessments Pulmonary Assessment Handoff:  Lung sounds: Bilateral Breath Sounds: Clear, Diminished        R Recommendations: See Admitting Provider Note  Report given to:   Additional Notes:

## 2022-12-10 NOTE — ED Triage Notes (Signed)
Patient brought in by EMS from home with c/o cough. Per EMS patient cough started today along with running nose. He has Dementia and is A&Ox2. 142/96 97% RA 88 98.9 oral CBG: 170

## 2022-12-10 NOTE — Progress Notes (Signed)
Pharmacy Antibiotic Note  Siddharth Beecham is a 89 y.o. male admitted on 12/10/2022 with aspiration pneumonia.  Pharmacy has been consulted for Unasyn dosing.  Plan: Unasyn 3g IV q12h Follow renal function  Height: 5\' 8"  (172.7 cm) Weight: 73 kg (161 lb) IBW/kg (Calculated) : 68.4  Temp (24hrs), Avg:98 F (36.7 C), Min:97.7 F (36.5 C), Max:98.7 F (37.1 C)  Recent Labs  Lab 12/10/22 2047  WBC 2.7*  CREATININE 1.73*    Estimated Creatinine Clearance: 23.1 mL/min (A) (by C-G formula based on SCr of 1.73 mg/dL (H)).    No Known Allergies  Antimicrobials this admission: 9/9 Unasyn >>      Dose adjustments this admission:    Microbiology results: 9/8 Sputum: ordered    Thank you for allowing pharmacy to be a part of this patient's care.  Maryellen Pile, PharmD 12/10/2022 11:51 PM

## 2022-12-10 NOTE — H&P (Addendum)
History and Physical  John Rose ZOX:[id number] DOB: 01/04/1923 DOA: 12/10/2022  Referring physician: Dr. Estell Harpin, EDP  PCP: [facility]  Outpatient Specialists: Cardiology, nephrology. Patient coming from: Home, his son lives with him.  Chief Complaint: Cough, shortness of breath.  HPI: John Rose is a 89 y.o. male with medical history significant for CKD 3B, chronic diastolic CHF, cognitive impairment, who presents from home via EMS with complaints of sudden onset productive cough and shortness of breath for the past few hours.  The patient was in his usual state of health this morning and this afternoon was noted to have a congested cough by his caregiver.  History is obtained from the patient's son via phone.  No reported aspiration.  No reported subjective fevers, chills, or chest pain.  EMS was activated.  Upon EMS arrival, the patient was not hypoxic, O2 saturation was 97% on room air.  In the ED, lab studies were notable for BNP greater than 2000, high-sensitivity troponin 63, chest x-ray revealed bilateral pleural effusions right greater than left, possible right lower lobe consolidation.  The patient received 1 dose of IV Lasix 40 mg x 1 in the ED.  Due to concern for acute on chronic diastolic CHF, EDP requested admission.  Admitted by [facility], hospitalist service, to telemetry unit as inpatient status.  Due to suspected aspiration pneumonia, Unasyn was initiated.  ED Course: Temperature 97.7.  BP 118/80, pulse 78, respiratory 16, saturation 100% on room air.  Lab studies notable for serum potassium 5.3, BUN 36, creatinine 1.73, GFR 35, BNP 2688.  Troponin 63.  WBC 2.7, hemoglobin 12.7, MCV 105.9, platelet count 85.  COVID-19 screening test negative.  Influenza A&B PCR negative.  RSV by PCR negative.  Review of Systems: Review of systems as noted in the HPI. All other systems reviewed and are negative.   Past Medical History:  Diagnosis Date   Lumbar spondylosis  11/10/2016   Testosterone deficiency    Past Surgical History:  Procedure Laterality Date   NO PAST SURGERIES      Social History:  reports that he has never smoked. He has never used smokeless tobacco. He reports that he does not drink alcohol and does not use drugs.   No Known Allergies  Family History  Problem Relation Age of Onset   Hypertension Other       Prior to Admission medications   Medication Sig Start Date End Date Taking? Authorizing Provider  acetaminophen (TYLENOL) 650 MG CR tablet Take 650 mg by mouth every 8 (eight) hours as needed for pain.    [provider]  colchicine 0.6 MG tablet Take 0.6 mg by mouth at bedtime. 08/18/22   [provider]  DICLOFENAC SODIUM-MENTHOL GEL EX Apply topically daily as needed.    [provider]  furosemide (LASIX) 40 MG tablet Take 1 tablet (40 mg total) by mouth daily. 09/07/22   Chandrasekhar, [place] Dragon A, MD  gabapentin (NEURONTIN) 300 MG capsule Take 300 mg by mouth 2 (two) times daily. 11/19/21   [provider]  hydrocortisone (ANUSOL-HC) 2.5 % rectal cream Apply topically as needed for hemorrhoids or anal itching. 07/18/22   [provider]  leptospermum manuka honey (MEDIHONEY) PSTE paste Apply 1 application. topically daily. Apply to right lateral lower leg wound (just below the knee) and cover with a foam dressing. Apply thin layer (3 mm) to wound. 08/10/21   Glade Lloyd, MD  Multiple Vitamin (MULTIVITAMIN ADULT PO) Take 1 tablet by mouth daily.  [provider]  potassium chloride SA (KLOR-CON M20) 20 MEQ tablet Take 2 tablets (40 mEq total) by mouth daily. 09/07/22   Chandrasekhar, [place] Dragon A, MD  QUEtiapine (SEROQUEL) 25 MG tablet Take 12.5-25 mg by mouth at bedtime as needed. 11/10/21   [provider]  simethicone (MYLICON) 80 MG chewable tablet Chew 80 mg by mouth every 6 (six) hours as needed for flatulence.    [provider]  tamsulosin (FLOMAX) 0.4 MG  CAPS capsule Take 0.4 mg by mouth daily.  08/16/16   [provider]    Physical Exam: BP 118/80   Pulse 88   Temp 97.7 F (36.5 C) (Oral)   Resp 14   Ht 5\' 8"  (1.727 m)   Wt 73 kg   SpO2 100%   BMI 24.48 kg/m   General: 88 y.o. year-old male well developed well nourished in no acute distress.  Alert and verbal. Cardiovascular: Regular rate and rhythm with no rubs or gallops.  No thyromegaly or JVD noted.  1+ pitting edema in lower extremities bilaterally.   Respiratory: Diffuse rales bilaterally.  Poor inspiratory effort. Abdomen: Soft nontender nondistended with normal bowel sounds x4 quadrants. Muskuloskeletal: No cyanosis or clubbing noted bilaterally Neuro: CN II-XII intact, strength, sensation, reflexes Skin: No ulcerative lesions noted or rashes Psychiatry: Mood is appropriate for condition and setting          Labs on Admission:  Basic Metabolic Panel: Recent Labs  Lab 12/10/22 2047  NA 142  K 5.3*  CL 109  CO2 26  GLUCOSE 88  BUN 36*  CREATININE 1.73*  CALCIUM 9.2   Liver Function Tests: Recent Labs  Lab 12/10/22 2047  AST 37  ALT 35  ALKPHOS 79  BILITOT 0.6  PROT 7.1  ALBUMIN 3.9   No results for input(s): "LIPASE", "AMYLASE" in the last 168 hours. No results for input(s): "AMMONIA" in the last 168 hours. CBC: Recent Labs  Lab 12/10/22 2047  WBC 2.7*  NEUTROABS 1.6*  HGB 12.7*  HCT 41.0  MCV 105.9*  PLT 85*   Cardiac Enzymes: No results for input(s): "CKTOTAL", "CKMB", "CKMBINDEX", "TROPONINI" in the last 168 hours.  BNP (last 3 results) Recent Labs    12/10/22 2047  BNP 2,688.8*    ProBNP (last 3 results) No results for input(s): "PROBNP" in the last 8760 hours.  CBG: No results for input(s): "GLUCAP" in the last 168 hours.  Radiological Exams on Admission: CT Chest Wo Contrast  Result Date: 12/10/2022 CLINICAL DATA:  Cough x8 weeks with failed empiric treatment. History of dementia. EXAM: CT CHEST WITHOUT CONTRAST  TECHNIQUE: Multidetector CT imaging of the chest was performed following the standard protocol without IV contrast. RADIATION DOSE REDUCTION: This exam was performed according to the departmental dose-optimization program which includes automated exposure control, adjustment of the mA and/or kV according to patient size and/or use of iterative reconstruction technique. COMPARISON:  AP Lat chest today, portable chest 12/22/2021, CTA chest 08/04/2021. FINDINGS: Cardiovascular: There is mild-to-moderate panchamber cardiomegaly. There are three-vessel coronary artery calcifications, scattered calcifications across the aortic valve leaflets and moderate calcific plaques of the thoracic aorta without aneurysm. Scattered plaque disease in the great vessels. The pulmonary trunk is slightly prominent but unchanged. Small pericardial effusion is again noted anteriorly. The central veins are mildly prominent. Mediastinum/Nodes: Multinodular goiter again noted. Largest individual nodule again measures 3.5 cm in the left lower pole displacing the trachea to the right, unchanged. Given advanced age and comorbidities with limited life  expectancy, no follow-up imaging is recommended at this time. No intrathoracic or axillary adenopathy is seen. The thoracic esophagus is unremarkable. There is minimal mucoid debris in the posterior trachea. The main bronchi are clear. Lungs/Pleura: There is a moderate right and small to moderate-sized left layering pleural effusions. There is compressive atelectasis or consolidation in the right lower lobe, more linear compressive atelectatic effect in the posterior left lower lobe. There are no findings of subpleural interstitial edema. There is additional posterior linear atelectasis in the right middle lobe. There is new linear scarring or atelectasis laterally in the lingula. Mild diffuse bronchial thickening. The aerated lungs not obscured by the pleural effusions do not show evidence of a  focal infiltrate. There is a calcified left upper lobe granuloma. Upper Abdomen: 5 cm uncomplicated cyst noted in hepatic segment 2, smaller cyst to the right of this both stable. There is a 1.4 cm Bosniak 1 cyst in the upper pole of the left kidney. There is mild upper abdominal ascites. There appears to be increased intrahepatic biliary dilatation. Laboratory and clinical correlation recommended. No other significant upper abdominal findings. Abdominal aortic atherosclerosis. Musculoskeletal: There is thoracic kyphosis without spinal compression fracture. Multilevel spinal bridging enthesopathy. No suspicious bone lesions. The ribcage is intact. IMPRESSION: 1. Moderate right and small to moderate left layering pleural effusions with compressive atelectasis or consolidation in the right lower lobe. 2. Mild diffuse bronchial thickening which could be due to bronchitis or reactive airways disease. 3. Cardiomegaly with mildly prominent central veins and small pericardial effusion. No findings of interstitial edema. 4. Aortic and coronary artery atherosclerosis. 5. Increased intrahepatic biliary dilatation. Laboratory and clinical correlation recommended. 6. Upper abdominal ascites. 7. Multinodular goiter. No follow-up imaging recommended due to advanced age and comorbidities. Aortic Atherosclerosis (ICD10-I70.0). Electronically Signed   By: Almira Bar M.D.   On: 12/10/2022 21:06   DG Chest 2 View  Result Date: 12/10/2022 CLINICAL DATA:  Cough EXAM: CHEST - 2 VIEW COMPARISON:  12/22/2021 FINDINGS: Enlarged cardiac silhouette. Pulmonary vascular congestion. Moderate bilateral pleural effusions, right greater than left. Associated bibasilar opacities. No pneumothorax. IMPRESSION: Cardiomegaly with pulmonary vascular congestion and moderate bilateral pleural effusions, right greater than left. Associated bibasilar opacities, which may represent atelectasis or infection. Electronically Signed   By: Duanne Guess  D.O.   On: 12/10/2022 18:56    EKG: I independently viewed the EKG done and my findings are as followed: Sinus rhythm rate of 96.  Nonspecific ST-T changes.  QTc 435.  Assessment/Plan Present on Admission:  Acute on chronic diastolic (congestive) heart failure (HCC)  Principal Problem:   Acute on chronic diastolic (congestive) heart failure (HCC)  Acute on chronic diastolic CHF Presented with BNP greater than 2600, bilateral pleural effusions right greater than left, personally reviewed IV diuresis initiated in the ED, continue The patient takes p.o. Lasix 40 mg daily at home. Start strict I's and O's and daily weight Follow 2D echo ordered today. Last 2D echo done on 08/05/2021 revealed LVEF 60 to 65% with severe left ventricular hypertrophy, grade 2 diastolic dysfunction.  Elevated troponin, suspect demand ischemia in the setting of the above First set of high-sensitivity troponin 63 No evidence of acute ischemia on 12 lead EKG Trend troponin Follow 2D echo Monitor on telemetry  Suspected aspiration pneumonia Unasyn Follow-up baseline procalcitonin, sputum culture  Hyperkalemia Serum potassium 5.3 Continue IV diuresis Repeat BMP in the morning  Leukopenia, thrombocytopenia, unclear etiology WBC 2.7, hemoglobin 12.7, platelet count 85. Repeat CBC  BPH  Resume home tamsulosin Monitor urine output  Cognitive impairment Reorient as needed.  Physical debility PT OT assessment Fall precautions   Time: 75 minutes.   DVT prophylaxis: Subcu Lovenox daily  Code Status: Full code, per the patient's son via phone  Family Communication: Updated the patient's son John Rose via phone.  Disposition Plan: Admitted to telemetry unit  Consults called: None.  Admission status: Inpatient status.   Status is: Inpatient The patient requires at least 2 midnights for further evaluation and treatment of present condition.   Darlin Drop MD [facility] Pager  [phone]  If 7PM-7AM, please contact night-coverage [email or web address] Password [facility]  12/10/2022, 11:19 PM

## 2022-12-10 NOTE — ED Notes (Signed)
IV attempted x2 without success.

## 2022-12-11 ENCOUNTER — Inpatient Hospital Stay (HOSPITAL_COMMUNITY): Payer: Medicare Other

## 2022-12-11 ENCOUNTER — Encounter (HOSPITAL_COMMUNITY): Payer: Self-pay | Admitting: Internal Medicine

## 2022-12-11 DIAGNOSIS — I5033 Acute on chronic diastolic (congestive) heart failure: Secondary | ICD-10-CM | POA: Diagnosis not present

## 2022-12-11 DIAGNOSIS — I5043 Acute on chronic combined systolic (congestive) and diastolic (congestive) heart failure: Secondary | ICD-10-CM | POA: Diagnosis not present

## 2022-12-11 LAB — TROPONIN I (HIGH SENSITIVITY)
Troponin I (High Sensitivity): 63 ng/L — ABNORMAL HIGH (ref <18)
Troponin I (High Sensitivity): 67 ng/L — ABNORMAL HIGH (ref <18)

## 2022-12-11 LAB — ECHOCARDIOGRAM COMPLETE
AR max vel: 2.69 cm2
AV Area VTI: 2.88 cm2
AV Area mean vel: 2.56 cm2
AV Mean grad: 1 mmHg
AV Peak grad: 2.8 mmHg
Ao pk vel: 0.84 m/s
Area-P 1/2: 4.21 cm2
Height: 68 in
S' Lateral: 3.6 cm
Weight: 2599.66 [oz_av]

## 2022-12-11 LAB — BASIC METABOLIC PANEL
Anion gap: 11 (ref 5–15)
BUN: 34 mg/dL — ABNORMAL HIGH (ref 8–23)
CO2: 25 mmol/L (ref 22–32)
Calcium: 9.3 mg/dL (ref 8.9–10.3)
Chloride: 105 mmol/L (ref 98–111)
Creatinine, Ser: 1.53 mg/dL — ABNORMAL HIGH (ref 0.61–1.24)
GFR, Estimated: 41 mL/min — ABNORMAL LOW (ref >60)
Glucose, Bld: 101 mg/dL — ABNORMAL HIGH (ref 70–99)
Potassium: 5.2 mmol/L — ABNORMAL HIGH (ref 3.5–5.1)
Sodium: 141 mmol/L (ref 135–145)

## 2022-12-11 LAB — CBC
HCT: 44.9 % (ref 39.0–52.0)
Hemoglobin: 13.9 g/dL (ref 13.0–17.0)
MCH: 32.1 pg (ref 26.0–34.0)
MCHC: 31 g/dL (ref 30.0–36.0)
MCV: 103.7 fL — ABNORMAL HIGH (ref 80.0–100.0)
Platelets: 80 10*3/uL — ABNORMAL LOW (ref 150–400)
RBC: 4.33 MIL/uL (ref 4.22–5.81)
RDW: 14.1 % (ref 11.5–15.5)
WBC: 3 10*3/uL — ABNORMAL LOW (ref 4.0–10.5)
nRBC: 0 % (ref 0.0–0.2)

## 2022-12-11 LAB — MAGNESIUM: Magnesium: 2.2 mg/dL (ref 1.7–2.4)

## 2022-12-11 LAB — PROCALCITONIN: Procalcitonin: <0.1 ng/mL

## 2022-12-11 LAB — PHOSPHORUS: Phosphorus: 4.6 mg/dL (ref 2.5–4.6)

## 2022-12-11 MED ORDER — INFLUENZA VAC A&B SURF ANT ADJ 0.5 ML IM SUSY
0.5000 mL | PREFILLED_SYRINGE | INTRAMUSCULAR | Status: AC
  Administered 2022-12-17: 0.5 mL via INTRAMUSCULAR
  Filled 2022-12-11 (×2): qty 0.5

## 2022-12-11 MED ORDER — SENNOSIDES-DOCUSATE SODIUM 8.6-50 MG PO TABS: 1.0000 | ORAL_TABLET | Freq: Every evening | ORAL | Status: DC | PRN

## 2022-12-11 MED ORDER — ACETAMINOPHEN 325 MG PO TABS
650.0000 mg | ORAL_TABLET | Freq: Four times a day (QID) | ORAL | Status: DC | PRN
  Administered 2022-12-12: 650 mg via ORAL
  Filled 2022-12-11: qty 2

## 2022-12-11 MED ORDER — COLCHICINE 0.6 MG PO TABS
0.6000 mg | ORAL_TABLET | Freq: Every day | ORAL | Status: DC
  Administered 2022-12-11: 0.6 mg via ORAL
  Filled 2022-12-11: qty 1

## 2022-12-11 MED ORDER — QUETIAPINE FUMARATE 25 MG PO TABS
12.5000 mg | ORAL_TABLET | Freq: Two times a day (BID) | ORAL | Status: DC | PRN
  Filled 2022-12-11: qty 1

## 2022-12-11 MED ORDER — FUROSEMIDE 10 MG/ML IJ SOLN: 40.0000 mg | Freq: Two times a day (BID) | INTRAMUSCULAR | Status: DC

## 2022-12-11 MED ORDER — FUROSEMIDE 40 MG PO TABS
40.0000 mg | ORAL_TABLET | Freq: Every day | ORAL | Status: DC
  Administered 2022-12-12: 40 mg via ORAL
  Filled 2022-12-11: qty 1

## 2022-12-11 MED ORDER — QUETIAPINE FUMARATE 25 MG PO TABS: 25.0000 mg | ORAL_TABLET | Freq: Two times a day (BID) | ORAL | Status: DC

## 2022-12-11 MED ORDER — PNEUMOCOCCAL 20-VAL CONJ VACC 0.5 ML IM SUSY
0.5000 mL | PREFILLED_SYRINGE | INTRAMUSCULAR | Status: DC
  Filled 2022-12-11 (×2): qty 0.5

## 2022-12-11 MED ORDER — METOPROLOL SUCCINATE ER 50 MG PO TB24
25.0000 mg | ORAL_TABLET | Freq: Every day | ORAL | Status: DC
  Administered 2022-12-11 – 2022-12-12 (×2): 25 mg via ORAL
  Filled 2022-12-11 (×2): qty 1

## 2022-12-11 MED ORDER — HALOPERIDOL LACTATE 5 MG/ML IJ SOLN: 0.5000 mg | Freq: Once | INTRAMUSCULAR | Status: DC

## 2022-12-11 MED ORDER — ONDANSETRON HCL 4 MG PO TABS: 4.0000 mg | ORAL_TABLET | Freq: Four times a day (QID) | ORAL | Status: DC | PRN

## 2022-12-11 MED ORDER — ACETAMINOPHEN 650 MG RE SUPP: 650.0000 mg | Freq: Four times a day (QID) | RECTAL | Status: DC | PRN

## 2022-12-11 MED ORDER — QUETIAPINE FUMARATE 25 MG PO TABS
12.5000 mg | ORAL_TABLET | Freq: Two times a day (BID) | ORAL | Status: DC
  Administered 2022-12-11: 12.5 mg via ORAL
  Filled 2022-12-11 (×2): qty 1

## 2022-12-11 MED ORDER — GABAPENTIN 300 MG PO CAPS
300.0000 mg | ORAL_CAPSULE | Freq: Two times a day (BID) | ORAL | Status: DC
  Administered 2022-12-11 – 2022-12-19 (×17): 300 mg via ORAL
  Filled 2022-12-11 (×17): qty 1

## 2022-12-11 MED ORDER — ONDANSETRON HCL 4 MG/2ML IJ SOLN: 4.0000 mg | Freq: Four times a day (QID) | INTRAMUSCULAR | Status: DC | PRN

## 2022-12-11 NOTE — TOC Initial Note (Signed)
Transition of Care [facility]) - Initial/Assessment Note    Patient Details  Name: John Rose MRN: [id number] Date of Birth: 1923/02/04  [facility] of [facility]) CM/SW Contact:    Lanier Clam, RN Phone Number: 12/11/2022, 3:29 PM  Clinical Narrative: Await PT recc.                    Barriers to Discharge: Continued Medical Work up   Patient Goals and CMS Choice Patient states their goals for this hospitalization and ongoing recovery are:: Home CMS Medicare.gov Compare Post Acute Care list provided to:: Patient Represenative (must comment) Choice offered to / list presented to : Adult Children      Expected Discharge Plan and Services                                              Prior Living Arrangements/Services                       Activities of Daily Living Home Assistive Devices/Equipment: Wheelchair, Shower chair with back ADL Screening (condition at time of admission) Patient's cognitive ability adequate to safely complete daily activities?: No Is the patient deaf or have difficulty hearing?: Yes Does the patient have difficulty seeing, even when wearing glasses/contacts?: No Does the patient have difficulty concentrating, remembering, or making decisions?: Yes Patient able to express need for assistance with ADLs?: Yes Does the patient have difficulty dressing or bathing?: Yes Independently performs ADLs?: No Communication: Independent Dressing (OT): Needs assistance Is this a change from baseline?: Pre-admission baseline Grooming: Needs assistance Is this a change from baseline?: Pre-admission baseline Feeding: Independent Bathing: Needs assistance Is this a change from baseline?: Pre-admission baseline Toileting: Needs assistance Is this a change from baseline?: Pre-admission baseline In/Out Bed: Needs assistance Is this a change from baseline?: Pre-admission baseline Walks in Home: Dependent Is this a change from baseline?:  Pre-admission baseline Does the patient have difficulty walking or climbing stairs?: Yes Weakness of Legs: Both Weakness of Arms/Hands: None  Permission Sought/Granted                  Emotional Assessment              Admission diagnosis:  Acute on chronic diastolic (congestive) heart failure (HCC) [I50.33] Patient Active Problem List   Diagnosis Date Noted   Acute on chronic diastolic (congestive) heart failure (HCC) 12/10/2022   Mixed hyperlipidemia 09/07/2022   Stage 3b chronic kidney disease (HCC) 09/07/2022   PAC (premature atrial contraction) 09/07/2022   Dementia (HCC) 09/07/2022   Bilateral lower extremity edema 12/14/2021   Urinary tract infection without hematuria 12/14/2021   Acute on chronic heart failure with preserved ejection fraction (HCC) 12/14/2021   Rhabdomyolysis 08/04/2021   Aortic atherosclerosis (HCC) 08/04/2021   Pain of left hip joint 04/13/2021   Lumbar spondylosis 11/10/2016   PCP:  [facility] Pharmacy:   [id number] - [place], [place] - [address] AT [facility] [address] [place] [address] Phone: [phone] Fax: [phone]     Social Determinants of Health (SDOH) Social History: SDOH Screenings   Food Insecurity: No Food Insecurity (12/11/2022)  Housing: Low Risk  (12/11/2022)  Transportation Needs: No Transportation Needs (12/11/2022)  Utilities: Not At Risk (12/11/2022)  Tobacco Use: Low Risk  (  12/11/2022)   SDOH Interventions:     Readmission Risk Interventions     No data to display

## 2022-12-11 NOTE — Progress Notes (Signed)
[facility] Progress Note Patient: John Rose BJY:[id number] DOB: Jan 13, 1923 DOA: 12/10/2022  DOS: the patient was seen and examined on 12/11/2022  Brief hospital course: PMH of CKD 3B, HFpEF, dementia present to the hospital with complaints of cough and shortness of breath. CT scan showed moderate bilateral pleural effusion with compressive atelectasis. Was mildly hypoxic. Admitted he was started on antibiotic and Lasix. Currently only issues agitation.  Assessment and Plan: Acute on chronic combined systolic and diastolic CHF Treated with IV Lasix. BNP was elevated. For now does not appear to be volume overloaded. Prior echocardiogram in 2023 showed EF of 6065%. Echocardiogram this admission shows 25% with global hypokinesis, LVH and grade 2 diastolic dysfunction along with RV dysfunction. Will discuss with cardiology for further workup although given his age and dementia options might be limited. Will hold IV diuresis. Resume p.o. Monitor.  Elevated troponin. No evidence of EKG changes. Minimally elevated.  And not following ACS dynamics. Most likely in the setting of poor renal clearance versus demand ischemia  Concern for aspiration pneumonia-ruled out. Procalcitonin negative. Patient is on room air. Do not suspect the patient is suffering from any pneumonia or active infection. Monitor.  Hyperkalemia. Receiving diuresis. Will monitor.  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 Monitor.  BPH. On Flomax. Continue.  Dementia with agitation in the hospital. On Seroquel at home as needed.  Will continue.  Deconditioning. Comes from home. PT OT consulted.  Will monitor.  Acute kidney injury on CKD stage IIIb. Serum creatinine 2023 around 1.4. Serum creatinine on admission 1.7. Improving to 1.5. Suspect this is cardiorenal hemodynamics. Resuming Lasix tomorrow.  Goals of care conversation. Prognosis poor in the setting of cardiomyopathy as well as  dementia. Along with CKD stage IIIb. Will place medical consultation for further conversation.   Subjective: No shortness of breath.  No nausea or vomiting at the time of my evaluation.  No fever no chills.  Physical Exam: General: in Mild distress, No Rash Cardiovascular: S1 and S2 Present, No Murmur Respiratory: Good respiratory effort, Bilateral Air entry present. No Crackles, No wheezes Abdomen: Bowel Sound present, No tenderness Extremities: No edema Neuro: Alert and oriented x3, no new focal deficit  Data Reviewed: I have Reviewed nursing notes, Vitals, and Lab results. Since last encounter, pertinent lab results CBC and BMP   . I have ordered test including CBC and BMP  . I have ordered imaging cardiology  .   Disposition: Status is: Inpatient Remains inpatient appropriate because: Further workup for cardiomyopathy   Family Communication: Discussed with son Level of care: Telemetry   Vitals:   12/11/22 0452 12/11/22 0500 12/11/22 1349 12/11/22 1658  BP: (!) 149/101  (!) 153/101 (!) 122/90  Pulse: 80  83 92  Resp: 17  17 14   Temp: 97.8 F (36.6 C)  (!) 97.5 F (36.4 C) 97.7 F (36.5 C)  TempSrc: Oral  Oral Axillary  SpO2: 96%  100% (!) 82%  Weight:  73.7 kg    Height:         Author: Lynden Oxford, MD 12/11/2022 6:32 PM  Please look on [email or web address] to find out who is on call.

## 2022-12-11 NOTE — Progress Notes (Signed)
Chaplain engaged in an initial visit with John Rose. Chaplain introduced herself and offered support. Delray voiced that he didn't request prayer but Chaplain was able to provide supportive and compassionate presence. Joseangel shared that he was interested in getting a cane. Physician came in as he was expressing his need for a cane.   Chaplain is available to provide support as needed.    12/11/22 1000  Spiritual Encounters  Type of Visit Initial  Care provided to: Patient  Reason for visit Routine spiritual support

## 2022-12-11 NOTE — Plan of Care (Signed)

## 2022-12-11 NOTE — Hospital Course (Addendum)
Brief hospital course: PMH of CKD 3B, HFpEF, dementia present to the hospital with complaints of cough and shortness of breath. CT scan showed moderate bilateral pleural effusion with compressive atelectasis. Was mildly hypoxic. Admitted he was started on antibiotic and Lasix. Currently only issues agitation.  Assessment and Plan: Acute on chronic combined systolic and diastolic CHF Treated with IV Lasix. BNP was elevated. For now does not appear to be volume overloaded. Prior echocardiogram in 2023 showed EF of 6065%. Echocardiogram this admission shows 25% with global hypokinesis, LVH and grade 2 diastolic dysfunction along with RV dysfunction. Will discuss with cardiology for further workup although given his age and dementia options might be limited. Will hold IV diuresis. Resume p.o. Monitor.  Elevated troponin. No evidence of EKG changes. Minimally elevated.  And not following ACS dynamics. Most likely in the setting of poor renal clearance versus demand ischemia  Concern for aspiration pneumonia-ruled out. Procalcitonin negative. Patient is on room air. Do not suspect the patient is suffering from any pneumonia or active infection. Monitor.  Hyperkalemia. Receiving diuresis. Will monitor.  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 Monitor.  BPH. On Flomax. Continue.  Dementia with agitation in the hospital. On Seroquel at home as needed.  Will continue.  Deconditioning. Comes from home. PT OT consulted.  Will monitor.  Acute kidney injury on CKD stage IIIb. Serum creatinine 2023 around 1.4. Serum creatinine on admission 1.7. Improving to 1.5. Suspect this is cardiorenal hemodynamics. Resuming Lasix tomorrow.  Goals of care conversation. Prognosis poor in the setting of cardiomyopathy as well as dementia. Along with CKD stage IIIb. Will place medical consultation for further conversation.

## 2022-12-11 NOTE — Evaluation (Signed)
Occupational Therapy Evaluation Patient Details Name: John Rose MRN: [id number] DOB: [birth year] Today's Date: 12/11/2022   History of Present Illness Ashraf Merrihew is a 89 y.o. male admitted 12/10/22 for productive cough, SOB. Imaging found Cardiomegaly with pulmonary vascular congestion and moderate bilateral pleural effusions, right greater than left. Associated bibasilar opacities, which may represent atelectasis or infection. PMH includes CKD 3B, chronic diastolic CHF, cognitive impairment   Clinical Impression   Pt is unreliable historian. Per chart review and previous admissions, it seems as though he lives with his son, and there is a caregiver with him daily. Pt today is overall cooperative, pleasant - but with cognitive deficits (baseline?). Mod to set up for UB ADL and mod to max A for LB ADL, mod A for stand pivot transfers face to face today. Pt will benefit from skilled OT in the acute setting. For post-acute <3 hours daily rehabilitation to maximize safety and independence in ADL and functional transfers unless his family states they are comfortable taking him back home with previous levels of care giving - if that is the case HHOT.        If plan is discharge home, recommend the following: A lot of help with walking and/or transfers;A lot of help with bathing/dressing/bathroom;Direct supervision/assist for medications management;Direct supervision/assist for financial management;Assist for transportation;Help with stairs or ramp for entrance;Supervision due to cognitive status    Functional Status Assessment  Patient has had a recent decline in their functional status and demonstrates the ability to make significant improvements in function in a reasonable and predictable amount of time.  Equipment Recommendations  None recommended by OT (TBD - when confirmed with family if he has appropiate DME)    Recommendations for Other Services PT consult     Precautions / Restrictions  Precautions Precautions: Fall Precaution Comments: has been combative with RN, not with therapy yet Restrictions Weight Bearing Restrictions: No      Mobility Bed Mobility Overal bed mobility: Needs Assistance Bed Mobility: Rolling, Sidelying to Sit, Sit to Supine Rolling: Min assist, Used rails Sidelying to sit: Mod assist, Used rails   Sit to supine: Max assist, +2 for physical assistance (helicopter technique used)   General bed mobility comments: extra time required for all aspects of bed mobility, cueing for sequencing and use of rails, assist with trunk elevation    Transfers Overall transfer level: Needs assistance Equipment used: 1 person hand held assist Transfers: Sit to/from Stand, Bed to chair/wheelchair/BSC Sit to Stand: Mod assist, From elevated surface Stand pivot transfers: Mod assist, +2 safety/equipment         General transfer comment: extra time and effort to power into standing space limitations so performed face to face      Balance Overall balance assessment: Needs assistance Sitting-balance support: Single extremity supported, Feet supported Sitting balance-Leahy Scale: Fair Sitting balance - Comments: unchallenged EOB Postural control: Posterior lean (initial, improves given time) Standing balance support: Bilateral upper extremity supported Standing balance-Leahy Scale: Poor Standing balance comment: dependent on external source                           ADL either performed or assessed with clinical judgement   ADL Overall ADL's : Needs assistance/impaired Eating/Feeding: Set up;Sitting;Minimal assistance Eating/Feeding Details (indicate cue type and reason): in chair, self feeding, some assist opening containers Grooming: Wash/dry hands;Wash/dry face;Set up;Sitting;Cueing for sequencing Grooming Details (indicate cue type and reason): in recliner Upper Body Bathing: Moderate assistance  Lower Body Bathing: Maximal  assistance   Upper Body Dressing : Minimal assistance Upper Body Dressing Details (indicate cue type and reason): new gown Lower Body Dressing: Maximal assistance Lower Body Dressing Details (indicate cue type and reason): socks Toilet Transfer: Moderate assistance;Stand-pivot Toilet Transfer Details (indicate cue type and reason): 1 person face to face transfer Toileting- Clothing Manipulation and Hygiene: Maximal assistance;Bed level       Functional mobility during ADLs: Moderate assistance;+2 for safety/equipment (face to face SPT bed<>recliner)       Vision Baseline Vision/History: 1 Wears glasses Additional Comments: not assessed this session     Perception Perception: Within Functional Limits       Praxis Praxis: WFL       Pertinent Vitals/Pain Pain Assessment Pain Assessment: Faces Faces Pain Scale: Hurts a little bit Pain Location: LLE - and generalized stiff/pain with movement Pain Descriptors / Indicators: Discomfort, Grimacing Pain Intervention(s): Monitored during session, Repositioned     Extremity/Trunk Assessment Upper Extremity Assessment Upper Extremity Assessment: Overall WFL for tasks assessed;Generalized weakness   Lower Extremity Assessment Lower Extremity Assessment: Defer to PT evaluation   Cervical / Trunk Assessment Cervical / Trunk Assessment: Kyphotic   Communication Communication Communication: Hearing impairment Cueing Techniques: Verbal cues;Gestural cues;Tactile cues   Cognition Arousal: Alert Behavior During Therapy: WFL for tasks assessed/performed Overall Cognitive Status: No family/caregiver present to determine baseline cognitive functioning                                 General Comments: Pt generally cooperative and following one step directions with cues.     General Comments  Pt found in saturated bed, male purewick not functioning properly. NT arrived to assist with cleaning and new linens     Exercises     Shoulder Instructions      Home Living Family/patient expects to be discharged to:: Private residence Living Arrangements: Children (adult son) Available Help at Discharge: Personal care attendant (caregiver? unsure?)                             Additional Comments: Pt poor historian, unable to get home set up at this time      Prior Functioning/Environment Prior Level of Function : Patient poor historian/Family not available             Mobility Comments: Very concerned about his quad cane ADLs Comments: independent with DME? unreliable historian        OT Problem List: Decreased strength;Decreased activity tolerance;Impaired balance (sitting and/or standing)      OT Treatment/Interventions: Self-care/ADL training;Therapeutic exercise;Energy conservation;DME and/or AE instruction;Therapeutic activities;Patient/family education;Balance training    OT Goals(Current goals can be found in the care plan section) Acute Rehab OT Goals Patient Stated Goal: eat my lunch OT Goal Formulation: With patient Time For Goal Achievement: 12/25/22 Potential to Achieve Goals: Good ADL Goals Pt Will Perform Eating: with modified independence;sitting Pt Will Perform Grooming: with modified independence;sitting Pt Will Transfer to Toilet: with contact guard assist;ambulating Pt Will Perform Toileting - Clothing Manipulation and hygiene: with min assist;with caregiver independent in assisting;sit to/from stand Additional ADL Goal #1: Pt will perform bed mobility at min guard assist level prior to engaging in ADL  OT Frequency: Min 1X/week    Co-evaluation              AM-PAC OT "6 Clicks" Daily Activity  Outcome Measure Help from another person eating meals?: A Little Help from another person taking care of personal grooming?: A Little Help from another person toileting, which includes using toliet, bedpan, or urinal?: A Lot Help from another person  bathing (including washing, rinsing, drying)?: A Lot Help from another person to put on and taking off regular upper body clothing?: A Little Help from another person to put on and taking off regular lower body clothing?: A Lot 6 Click Score: 15   End of Session Equipment Utilized During Treatment: Gait belt Nurse Communication: Mobility status;Precautions  Activity Tolerance: Patient tolerated treatment well Patient left: in bed;with call bell/phone within reach;with bed alarm set;with nursing/sitter in room  OT Visit Diagnosis: Unsteadiness on feet (R26.81);Muscle weakness (generalized) (M62.81);History of falling (Z91.81);Other symptoms and signs involving cognitive function                Time: 1308-6578 OT Time Calculation (min): 21 min Charges:  OT General Charges $OT Visit: 1 Visit OT Evaluation $OT Eval Moderate Complexity: 1 Mod  Nyoka Cowden OTR/L Acute Rehabilitation Services Office: [phone]  Emelda Fear 12/11/2022, 1:13 PM

## 2022-12-11 NOTE — Progress Notes (Signed)
Assumed care of pt from off going RN. No changes in initial am assessment at this time. Cont with plan of care

## 2022-12-11 NOTE — Progress Notes (Signed)
[facility] Progress Note Patient: John Rose WUJ:[id number] DOB: [birth year] DOA: 12/10/2022  DOS: the patient was seen and examined on 12/11/2022  Brief hospital course: PMH of CKD 3B, HFpEF, dementia present to the hospital with complaints of cough and shortness of breath. CT scan showed moderate bilateral pleural effusion with compressive atelectasis. Was mildly hypoxic. Admitted he was started on antibiotic and Lasix. Currently only issues agitation.  Assessment and Plan: Acute on chronic diastolic CHF Treated with IV Lasix. BNP was elevated. For now does not appear to be volume overloaded. EF adequate. Echo ordered. Will hold IV diuresis. Resume p.o. Monitor.  Elevated troponin. No evidence of EKG changes. Minimally elevated. Monitor for  Concern for aspiration pneumonia-ruled out. Procalcitonin negative. Patient is on room air. Do not suspect the patient is suffering from any pneumonia or active infection. Monitor.  Hyperkalemia. Receiving diuresis. Will monitor.  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 Monitor.  BPH. On Flomax. Continue.  Dementia with agitation in the hospital. On Seroquel at home as needed.  Will continue.  Deconditioning. Comes from home. PT OT consulted.  Will monitor.   Subjective: No nausea no vomiting.  Was agitated with the RN.  Able to follow commands with me.  Has some shortness of breath.  Physical Exam: General: in Mild distress, No Rash Cardiovascular: S1 and S2 Present, No Murmur Respiratory: Good respiratory effort, Bilateral Air entry present. No Crackles, No wheezes Abdomen: Bowel Sound present, No tenderness Extremities: No edema Neuro: Alert and oriented x3, no new focal deficit  Data Reviewed: I have Reviewed nursing notes, Vitals, and Lab results. Since last encounter, pertinent lab results CBC and BMP   . I have ordered test including CBC and BMP  . I have ordered imaging chest x-ray  .   Disposition: Status  is: Inpatient Remains inpatient appropriate because: Likely home tomorrow.   Family Communication: No one at bedside Level of care: Telemetry   Vitals:   12/11/22 0452 12/11/22 0500 12/11/22 1349 12/11/22 1658  BP: (!) 149/101  (!) 153/101 (!) 122/90  Pulse: 80  83 92  Resp: 17  17 14   Temp: 97.8 F (36.6 C)  (!) 97.5 F (36.4 C) 97.7 F (36.5 C)  TempSrc: Oral  Oral Oral  SpO2: 96%  100% (!) 82%  Weight:  73.7 kg    Height:         Author: Lynden Oxford, MD 12/11/2022 6:27 PM  Please look on [email or web address] to find out who is on call.

## 2022-12-11 NOTE — Progress Notes (Signed)
OT Cancellation Note  Patient Details Name: Naveen Petry MRN: [id number] DOB: 1922-11-02   Cancelled Treatment:    Reason Eval/Treat Not Completed: Fatigue/lethargy limiting ability to participate (Pt deeply sleeping). Per RN he has been agitated in unfamiliar environment.  Evern Bio Chairty Toman 12/11/2022, 11:36 AM  Nyoka Cowden OTR/L Acute Rehabilitation Services Office: [phone]

## 2022-12-12 ENCOUNTER — Inpatient Hospital Stay (HOSPITAL_COMMUNITY): Payer: Medicare Other

## 2022-12-12 DIAGNOSIS — I5033 Acute on chronic diastolic (congestive) heart failure: Secondary | ICD-10-CM | POA: Diagnosis not present

## 2022-12-12 LAB — BASIC METABOLIC PANEL
Anion gap: 6 (ref 5–15)
BUN: 34 mg/dL — ABNORMAL HIGH (ref 8–23)
CO2: 30 mmol/L (ref 22–32)
Calcium: 8.6 mg/dL — ABNORMAL LOW (ref 8.9–10.3)
Chloride: 105 mmol/L (ref 98–111)
Creatinine, Ser: 1.55 mg/dL — ABNORMAL HIGH (ref 0.61–1.24)
GFR, Estimated: 40 mL/min — ABNORMAL LOW (ref >60)
Glucose, Bld: 100 mg/dL — ABNORMAL HIGH (ref 70–99)
Potassium: 4.6 mmol/L (ref 3.5–5.1)
Sodium: 141 mmol/L (ref 135–145)

## 2022-12-12 LAB — CBC WITH DIFFERENTIAL/PLATELET
Abs Immature Granulocytes: 0 10*3/uL (ref 0.00–0.07)
Basophils Absolute: 0 10*3/uL (ref 0.0–0.1)
Basophils Relative: 1 %
Eosinophils Absolute: 0.1 10*3/uL (ref 0.0–0.5)
Eosinophils Relative: 3 %
HCT: 38.7 % — ABNORMAL LOW (ref 39.0–52.0)
Hemoglobin: 11.9 g/dL — ABNORMAL LOW (ref 13.0–17.0)
Immature Granulocytes: 0 %
Lymphocytes Relative: 33 %
Lymphs Abs: 0.9 10*3/uL (ref 0.7–4.0)
MCH: 32.3 pg (ref 26.0–34.0)
MCHC: 30.7 g/dL (ref 30.0–36.0)
MCV: 105.2 fL — ABNORMAL HIGH (ref 80.0–100.0)
Monocytes Absolute: 0.4 10*3/uL (ref 0.1–1.0)
Monocytes Relative: 15 %
Neutro Abs: 1.3 10*3/uL — ABNORMAL LOW (ref 1.7–7.7)
Neutrophils Relative %: 48 %
Platelets: 84 10*3/uL — ABNORMAL LOW (ref 150–400)
RBC: 3.68 MIL/uL — ABNORMAL LOW (ref 4.22–5.81)
RDW: 14 % (ref 11.5–15.5)
WBC: 2.7 10*3/uL — ABNORMAL LOW (ref 4.0–10.5)
nRBC: 0 % (ref 0.0–0.2)

## 2022-12-12 LAB — MAGNESIUM: Magnesium: 2.1 mg/dL (ref 1.7–2.4)

## 2022-12-12 MED ORDER — QUETIAPINE FUMARATE 25 MG PO TABS
25.0000 mg | ORAL_TABLET | Freq: Every day | ORAL | Status: DC
  Administered 2022-12-12 – 2022-12-18 (×7): 25 mg via ORAL
  Filled 2022-12-12 (×7): qty 1

## 2022-12-12 MED ORDER — FUROSEMIDE 10 MG/ML IJ SOLN
40.0000 mg | Freq: Every day | INTRAMUSCULAR | Status: DC
  Administered 2022-12-12 – 2022-12-14 (×3): 40 mg via INTRAVENOUS
  Filled 2022-12-12 (×3): qty 4

## 2022-12-12 NOTE — Consult Note (Addendum)
Cardiology Consultation   Patient ID: John Rose MRN: [id number]; DOB: 1923/01/04  Admit date: 12/10/2022 Date of Consult: 12/12/2022  PCP:  [facility]   [facility] Providers Cardiologist:  Christell Constant, MD        Patient Profile:   John Rose is a 89 y.o. male with a hx of CHF, PACs, dementia, CKD IIIb, who is being seen 12/12/2022 for the evaluation of newly reduced LVEF at the request of Dr. Allena Katz.  History of Present Illness:   Per note review, John Rose presented to the ED from home via EMS with acute onset productive cough and shortness of breath. Patient was not hypoxic with EMS. Per chart review, ED labs noted BNP 2688.8, HS troponin 63->63->67. No leukocytosis noted. CXR with bilateral pleural effusions. COVID-19, influenza, RSV all negative. Cardiology consulted after repeat TTE found LVEF reduced to 20-25% with global hypokinesis (previously 60-65% as of May 2023).   On exam, patient with limited ability to provide history secondary to confusion/dementia. He is not currently accompanied by any family. Patient reports increased swelling in lower extremities over the past 2-3 months. Unclear per patient history if there was any associated shortness of breath with this edema. He denies chest pain or shortness of breath at the current time.   Past Medical History:  Diagnosis Date   Lumbar spondylosis 11/10/2016   Testosterone deficiency     Past Surgical History:  Procedure Laterality Date   NO PAST SURGERIES       Home Medications:  Prior to Admission medications   Medication Sig Start Date End Date Taking? Authorizing Provider  acetaminophen (TYLENOL) 650 MG CR tablet Take 650 mg by mouth every 8 (eight) hours as needed for pain.   Yes [provider]  colchicine 0.6 MG tablet Take 0.6 mg by mouth at bedtime. 08/18/22  Yes [provider]  diclofenac Sodium (VOLTAREN) 1 % GEL Apply 2 g topically every 8 (eight)  hours. 10/17/22  Yes [provider]  furosemide (LASIX) 40 MG tablet Take 1 tablet (40 mg total) by mouth daily. 09/07/22  Yes Evert Wenrich A, MD  gabapentin (NEURONTIN) 300 MG capsule Take 300 mg by mouth 2 (two) times daily. 11/19/21  Yes [provider]  hydrocortisone (ANUSOL-HC) 2.5 % rectal cream Apply topically as needed for hemorrhoids or anal itching. 07/18/22  Yes [provider]  Multiple Vitamin (MULTIVITAMIN ADULT PO) Take 1 tablet by mouth daily.   Yes [provider]  potassium chloride SA (KLOR-CON M20) 20 MEQ tablet Take 2 tablets (40 mEq total) by mouth daily. 09/07/22  Yes Jenel Gierke A, MD  QUEtiapine (SEROQUEL) 25 MG tablet Take 12.5-25 mg by mouth at bedtime as needed. 11/10/21  Yes [provider]  simethicone (MYLICON) 80 MG chewable tablet Chew 80 mg by mouth every 6 (six) hours as needed for flatulence.   Yes [provider]  tamsulosin (FLOMAX) 0.4 MG CAPS capsule Take 0.4 mg by mouth daily.  08/16/16  Yes [provider]    Inpatient Medications: Scheduled Meds:  colchicine  0.6 mg Oral QHS   furosemide  40 mg Oral Daily   gabapentin  300 mg Oral BID   haloperidol lactate  0.5 mg Intravenous Once   influenza vaccine adjuvanted  0.5 mL Intramuscular Tomorrow-1000   metoprolol succinate  25 mg Oral Daily   pneumococcal 20-valent conjugate vaccine  0.5 mL Intramuscular Tomorrow-1000   QUEtiapine  12.5 mg Oral BID  tamsulosin  0.4 mg Oral Daily   Continuous Infusions:  PRN Meds: acetaminophen **OR** acetaminophen, ondansetron **OR** ondansetron (ZOFRAN) IV, senna-docusate  Allergies:   No Known Allergies  Social History:   Social History   Socioeconomic History   Marital status: Single    Spouse name: Not on file   Number of children: 1   Years of education: BS   Highest education level: Not on file  Occupational History   Occupation: Retired  Tobacco Use   Smoking status:  Never   Smokeless tobacco: Never  Vaping Use   Vaping status: Never Used  Substance and Sexual Activity   Alcohol use: No   Drug use: No   Sexual activity: Not on file  Other Topics Concern   Not on file  Social History Narrative   Lives    Caffeine use: Tea, soda (pepsi)   Social Determinants of Health   Financial Resource Strain: Not on file  Food Insecurity: No Food Insecurity (12/11/2022)   Hunger Vital Sign    Worried About Running Out of Food in the Last Year: Never true    Ran Out of Food in the Last Year: Never true  Transportation Needs: No Transportation Needs (12/11/2022)   PRAPARE - Administrator, Civil Service (Medical): No    Lack of Transportation (Non-Medical): No  Physical Activity: Not on file  Stress: Not on file  Social Connections: Not on file  Intimate Partner Violence: Not At Risk (12/11/2022)   Humiliation, Afraid, Rape, and Kick questionnaire    Fear of Current or Ex-Partner: No    Emotionally Abused: No    Physically Abused: No    Sexually Abused: No    Family History:    Family History  Problem Relation Age of Onset   Hypertension Other      ROS:  Please see the history of present illness.   All other ROS reviewed and negative.     Physical Exam/Data:   Vitals:   12/11/22 1658 12/11/22 2156 12/12/22 0500 12/12/22 0540  BP: (!) 122/90 130/85  124/84  Pulse: 92 75  73  Resp: 14 17  16   Temp: 97.7 F (36.5 C) 97.7 F (36.5 C)  (!) 97.5 F (36.4 C)  TempSrc: Axillary Oral  Oral  SpO2: (!) 82%   100%  Weight:   72.2 kg   Height:        Intake/Output Summary (Last 24 hours) at 12/12/2022 1039 Last data filed at 12/12/2022 0400 Gross per 24 hour  Intake 720 ml  Output 701 ml  Net 19 ml      12/12/2022    5:00 AM 12/11/2022    5:00 AM 12/11/2022   12:26 AM  Last 3 Weights  Weight (lbs) 159 lb 2.8 oz 162 lb 7.7 oz 162 lb 7.7 oz  Weight (kg) 72.2 kg 73.7 kg 73.7 kg     Body mass index is 24.2 kg/m.  General: Frail  appearing but in no acute distress HEENT: normal Neck: JVD elevated to angle of mandible with HOB at 35 degrees Vascular: No carotid bruits; Distal pulses 2+ bilaterally Cardiac:  normal S1, S2; RRR; no murmur  Lungs:  lungs diffusely diminished Abd: soft, nontender, no hepatomegaly  Ext: 1-2+ pitting edema in bilateral LE to pre-tibial area Musculoskeletal:  No deformities, no gross MSK deficits Skin: warm and dry  Neuro:  CNs 2-12 intact, no focal abnormalities noted Psych:  Normal affect   EKG:  The EKG  was personally reviewed and demonstrates:  sinus rhythm with poor R wave progression in precordial leads. New TWI in low lateral leads when compared to prior tracings Telemetry:  Telemetry was personally reviewed and demonstrates:  sinus rhythm  Relevant CV Studies:  IMPRESSIONS     1. SVi 19 cc/m2. Left ventricular ejection fraction, by estimation, is 20  to 25%. The left ventricle has severely decreased function. The left  ventricle demonstrates global hypokinesis. There is moderate left  ventricular hypertrophy. Left ventricular  diastolic parameters are consistent with Grade II diastolic dysfunction  (pseudonormalization).   2. Right ventricular systolic function is mildly reduced. The right  ventricular size is mildly enlarged. There is moderately elevated  pulmonary artery systolic pressure. The estimated right ventricular  systolic pressure is 53.7 mmHg.   3. Left atrial size was moderately dilated.   4. Right atrial size was moderately dilated.   5. +Liver cyst. a small pericardial effusion is present. Large pleural  effusion.   6. Mild mitral valve regurgitation.   7. Tricuspid valve regurgitation is severe.   8. There is moderate calcification of the aortic valve. Aortic valve  regurgitation is mild.   9. The inferior vena cava is dilated in size with <50% respiratory  variability, suggesting right atrial pressure of 15 mmHg.   Conclusion(s)/Recommendation(s): EF  has significantly reduced compared to  prior study.   FINDINGS   Left Ventricle: SVi 19 cc/m2. Left ventricular ejection fraction, by  estimation, is 20 to 25%. The left ventricle has severely decreased  function. The left ventricle demonstrates global hypokinesis. The left  ventricular internal cavity size was normal  in size. There is moderate left ventricular hypertrophy. Left ventricular  diastolic parameters are consistent with Grade II diastolic dysfunction  (pseudonormalization).   Right Ventricle: The right ventricular size is mildly enlarged. Right  ventricular systolic function is mildly reduced. There is moderately  elevated pulmonary artery systolic pressure. The tricuspid regurgitant  velocity is 3.11 m/s, and with an assumed  right atrial pressure of 15 mmHg, the estimated right ventricular systolic  pressure is 53.7 mmHg.   Left Atrium: Left atrial size was moderately dilated.   Right Atrium: Right atrial size was moderately dilated.   Pericardium: +Liver cyst. A small pericardial effusion is present.   Mitral Valve: Mild mitral valve regurgitation.   Tricuspid Valve: Tricuspid valve regurgitation is severe.   Aortic Valve: There is moderate calcification of the aortic valve. Aortic  valve regurgitation is mild. Aortic valve mean gradient measures 1.0 mmHg.  Aortic valve peak gradient measures 2.8 mmHg. Aortic valve area, by VTI  measures 2.88 cm.   Pulmonic Valve: Pulmonic valve regurgitation is mild to moderate.   Venous: The inferior vena cava is dilated in size with less than 50%  respiratory variability, suggesting right atrial pressure of 15 mmHg.   IAS/Shunts: No atrial level shunt detected by color flow Doppler.   Additional Comments: There is a large pleural effusion.    Laboratory Data:  High Sensitivity Troponin:   Recent Labs  Lab 12/10/22 2047 12/10/22 2332 12/11/22 0145  TROPONINIHS 63* 63* 67*     Chemistry Recent Labs  Lab  12/10/22 2047 12/11/22 0522 12/12/22 0836  NA 142 141 141  K 5.3* 5.2* 4.6  CL 109 105 105  CO2 26 25 30   GLUCOSE 88 101* 100*  BUN 36* 34* 34*  CREATININE 1.73* 1.53* 1.55*  CALCIUM 9.2 9.3 8.6*  MG  --  2.2 2.1  GFRNONAA 35*  41* 40*  ANIONGAP 7 11 6     Recent Labs  Lab 12/10/22 2047  PROT 7.1  ALBUMIN 3.9  AST 37  ALT 35  ALKPHOS 79  BILITOT 0.6   Lipids No results for input(s): "CHOL", "TRIG", "HDL", "LABVLDL", "LDLCALC", "CHOLHDL" in the last 168 hours.  Hematology Recent Labs  Lab 12/10/22 2047 12/11/22 0522 12/12/22 0836  WBC 2.7* 3.0* 2.7*  RBC 3.87* 4.33 3.68*  HGB 12.7* 13.9 11.9*  HCT 41.0 44.9 38.7*  MCV 105.9* 103.7* 105.2*  MCH 32.8 32.1 32.3  MCHC 31.0 31.0 30.7  RDW 14.4 14.1 14.0  PLT 85* 80* 84*   Thyroid No results for input(s): "TSH", "FREET4" in the last 168 hours.  BNP Recent Labs  Lab 12/10/22 2047  BNP 2,688.8*    DDimer No results for input(s): "DDIMER" in the last 168 hours.   Radiology/Studies:  ECHOCARDIOGRAM COMPLETE  Result Date: 12/11/2022    ECHOCARDIOGRAM REPORT   Patient Name:   John Rose Date of Exam: 12/11/2022 Medical Rec #:  [id number]      Height:       68.0 in Accession #:    [id number]     Weight:       162.5 lb Date of Birth:  [birth year]       BSA:          1.871 m Patient Age:    89 years       BP:           149/101 mmHg Patient Gender: M              HR:           77 bpm. Exam Location:  Inpatient Procedure: 2D Echo, Color Doppler and Cardiac Doppler REPORT CONTAINS CRITICAL RESULT Indications:    CHF  History:        Patient has prior history of Echocardiogram examinations, most                 recent 08/05/2021. CHF, CKD; Risk Factors:Dyslipidemia.  Sonographer:    Milbert Coulter Referring Phys: [id number] CAROLE N HALL IMPRESSIONS  1. SVi 19 cc/m2. Left ventricular ejection fraction, by estimation, is 20 to 25%. The left ventricle has severely decreased function. The left ventricle demonstrates global hypokinesis. There is  moderate left ventricular hypertrophy. Left ventricular diastolic parameters are consistent with Grade II diastolic dysfunction (pseudonormalization).  2. Right ventricular systolic function is mildly reduced. The right ventricular size is mildly enlarged. There is moderately elevated pulmonary artery systolic pressure. The estimated right ventricular systolic pressure is 53.7 mmHg.  3. Left atrial size was moderately dilated.  4. Right atrial size was moderately dilated.  5. +Liver cyst. a small pericardial effusion is present. Large pleural effusion.  6. Mild mitral valve regurgitation.  7. Tricuspid valve regurgitation is severe.  8. There is moderate calcification of the aortic valve. Aortic valve regurgitation is mild.  9. The inferior vena cava is dilated in size with <50% respiratory variability, suggesting right atrial pressure of 15 mmHg. Conclusion(s)/Recommendation(s): EF has significantly reduced compared to prior study. FINDINGS  Left Ventricle: SVi 19 cc/m2. Left ventricular ejection fraction, by estimation, is 20 to 25%. The left ventricle has severely decreased function. The left ventricle demonstrates global hypokinesis. The left ventricular internal cavity size was normal in size. There is moderate left ventricular hypertrophy. Left ventricular diastolic parameters are consistent with Grade II diastolic dysfunction (pseudonormalization). Right Ventricle: The right ventricular size is mildly enlarged. Right  ventricular systolic function is mildly reduced. There is moderately elevated pulmonary artery systolic pressure. The tricuspid regurgitant velocity is 3.11 m/s, and with an assumed right atrial pressure of 15 mmHg, the estimated right ventricular systolic pressure is 53.7 mmHg. Left Atrium: Left atrial size was moderately dilated. Right Atrium: Right atrial size was moderately dilated. Pericardium: +Liver cyst. A small pericardial effusion is present. Mitral Valve: Mild mitral valve  regurgitation. Tricuspid Valve: Tricuspid valve regurgitation is severe. Aortic Valve: There is moderate calcification of the aortic valve. Aortic valve regurgitation is mild. Aortic valve mean gradient measures 1.0 mmHg. Aortic valve peak gradient measures 2.8 mmHg. Aortic valve area, by VTI measures 2.88 cm. Pulmonic Valve: Pulmonic valve regurgitation is mild to moderate. Venous: The inferior vena cava is dilated in size with less than 50% respiratory variability, suggesting right atrial pressure of 15 mmHg. IAS/Shunts: No atrial level shunt detected by color flow Doppler. Additional Comments: There is a large pleural effusion.  LEFT VENTRICLE PLAX 2D LVIDd:         3.80 cm   Diastology LVIDs:         3.60 cm   LV e' medial:    3.15 cm/s LV PW:         1.40 cm   LV E/e' medial:  21.1 LV IVS:        1.30 cm   LV e' lateral:   5.85 cm/s LVOT diam:     2.00 cm   LV E/e' lateral: 11.4 LV SV:         35 LV SV Index:   19 LVOT Area:     3.14 cm  RIGHT VENTRICLE RV Basal diam:  4.50 cm RV Mid diam:    3.50 cm RV S prime:     7.58 cm/s TAPSE (M-mode): 1.2 cm LEFT ATRIUM             Index        RIGHT ATRIUM           Index LA diam:        4.40 cm 2.35 cm/m   RA Area:     24.30 cm LA Vol (A2C):   79.4 ml 42.44 ml/m  RA Volume:   76.50 ml  40.89 ml/m LA Vol (A4C):   61.9 ml 33.08 ml/m LA Biplane Vol: 74.9 ml 40.03 ml/m  AORTIC VALVE AV Area (Vmax):    2.69 cm AV Area (Vmean):   2.56 cm AV Area (VTI):     2.88 cm AV Vmax:           84.40 cm/s AV Vmean:          55.300 cm/s AV VTI:            0.122 m AV Peak Grad:      2.8 mmHg AV Mean Grad:      1.0 mmHg LVOT Vmax:         72.40 cm/s LVOT Vmean:        45.000 cm/s LVOT VTI:          0.112 m LVOT/AV VTI ratio: 0.92  AORTA Ao Root diam: 3.60 cm MITRAL VALVE               TRICUSPID VALVE MV Area (PHT): 4.21 cm    TR Peak grad:   38.7 mmHg MV Decel Time: 180 msec    TR Vmax:        311.00 cm/s MV E velocity: 66.40 cm/s MV  A velocity: 48.00 cm/s  SHUNTS MV E/A ratio:   1.38        Systemic VTI:  0.11 m                            Systemic Diam: 2.00 cm Carolan Clines Electronically signed by Carolan Clines Signature Date/Time: 12/11/2022/3:03:24 PM    Final    CT Chest Wo Contrast  Result Date: 12/10/2022 CLINICAL DATA:  Cough x8 weeks with failed empiric treatment. History of dementia. EXAM: CT CHEST WITHOUT CONTRAST TECHNIQUE: Multidetector CT imaging of the chest was performed following the standard protocol without IV contrast. RADIATION DOSE REDUCTION: This exam was performed according to the departmental dose-optimization program which includes automated exposure control, adjustment of the mA and/or kV according to patient size and/or use of iterative reconstruction technique. COMPARISON:  AP Lat chest today, portable chest 12/22/2021, CTA chest 08/04/2021. FINDINGS: Cardiovascular: There is mild-to-moderate panchamber cardiomegaly. There are three-vessel coronary artery calcifications, scattered calcifications across the aortic valve leaflets and moderate calcific plaques of the thoracic aorta without aneurysm. Scattered plaque disease in the great vessels. The pulmonary trunk is slightly prominent but unchanged. Small pericardial effusion is again noted anteriorly. The central veins are mildly prominent. Mediastinum/Nodes: Multinodular goiter again noted. Largest individual nodule again measures 3.5 cm in the left lower pole displacing the trachea to the right, unchanged. Given advanced age and comorbidities with limited life expectancy, no follow-up imaging is recommended at this time. No intrathoracic or axillary adenopathy is seen. The thoracic esophagus is unremarkable. There is minimal mucoid debris in the posterior trachea. The main bronchi are clear. Lungs/Pleura: There is a moderate right and small to moderate-sized left layering pleural effusions. There is compressive atelectasis or consolidation in the right lower lobe, more linear compressive atelectatic effect in  the posterior left lower lobe. There are no findings of subpleural interstitial edema. There is additional posterior linear atelectasis in the right middle lobe. There is new linear scarring or atelectasis laterally in the lingula. Mild diffuse bronchial thickening. The aerated lungs not obscured by the pleural effusions do not show evidence of a focal infiltrate. There is a calcified left upper lobe granuloma. Upper Abdomen: 5 cm uncomplicated cyst noted in hepatic segment 2, smaller cyst to the right of this both stable. There is a 1.4 cm Bosniak 1 cyst in the upper pole of the left kidney. There is mild upper abdominal ascites. There appears to be increased intrahepatic biliary dilatation. Laboratory and clinical correlation recommended. No other significant upper abdominal findings. Abdominal aortic atherosclerosis. Musculoskeletal: There is thoracic kyphosis without spinal compression fracture. Multilevel spinal bridging enthesopathy. No suspicious bone lesions. The ribcage is intact. IMPRESSION: 1. Moderate right and small to moderate left layering pleural effusions with compressive atelectasis or consolidation in the right lower lobe. 2. Mild diffuse bronchial thickening which could be due to bronchitis or reactive airways disease. 3. Cardiomegaly with mildly prominent central veins and small pericardial effusion. No findings of interstitial edema. 4. Aortic and coronary artery atherosclerosis. 5. Increased intrahepatic biliary dilatation. Laboratory and clinical correlation recommended. 6. Upper abdominal ascites. 7. Multinodular goiter. No follow-up imaging recommended due to advanced age and comorbidities. Aortic Atherosclerosis (ICD10-I70.0). Electronically Signed   By: Almira Bar M.D.   On: 12/10/2022 21:06   DG Chest 2 View  Result Date: 12/10/2022 CLINICAL DATA:  Cough EXAM: CHEST - 2 VIEW COMPARISON:  12/22/2021 FINDINGS: Enlarged cardiac silhouette. Pulmonary vascular congestion. Moderate  bilateral pleural effusions, right greater than left. Associated bibasilar opacities. No pneumothorax. IMPRESSION: Cardiomegaly with pulmonary vascular congestion and moderate bilateral pleural effusions, right greater than left. Associated bibasilar opacities, which may represent atelectasis or infection. Electronically Signed   By: Duanne Guess D.O.   On: 12/10/2022 18:56     Assessment and Plan:   Acute HFrEF (previously preserved LVEF)  Patient admitted with worsening shortness of breath and productive cough. He was found with BNP elevated to 2688.8. CXR with bilateral pleural effusions. TTE checked and notable for severely reduced LVEF, ~20%, down from 60-65% on previously 2023 study.   Patient pleasant on exam but with limited ability to provide a history. Unable to reach son on phone today. By my personal review, LVEF is even less than reported 20-25% and appears nearly akinetic. Mild pericardial effusion also noted with mild-moderate RV dysfunction.  Given severely reduced LVEF, will hold beta blocker in order to facility diuresis. Lasix 40mg  x2 doses today. Will need to reassess patient's volume status and hemodynamic stability before repeating BID dosing tomorrow.  GDMT limited by patient frailty and CKD. Unlikely to tolerate Spironolactone with acute hyperkalemia this admission and historical BMP showing high normal potassium. Strongly encourage initiation of palliative care discussions/code status. Patient's prognosis is poor with such a drastically reduced LVEF. He is not a good candidate for intervention.  Troponin elevation  ED labs with HS troponin: 63->63->67. Patient does have new low lateral lead TWI and poor R wave progression. However, overall clinical picture not consistent with ACS. Troponin leak secondary to acute congestive heart failure.  Per primary team: BPH Dementia AKI  Risk Assessment/Risk Scores:        New York Heart Association (NYHA) Functional  Class NYHA Class III-IV        For questions or updates, please contact [facility] Please consult [email or web address] for contact info under    Signed, Perlie Gold, PA-C  12/12/2022 10:39 AM  Personally seen and examined. Agree with APP above with the following comments:  Briefly 89 yo M of mine who presented with new heart failure. Son notes that he has has a decline most prominent since his birthday. In June he was eating two meals a day.  Did some ADLs.  LE has improved with more aggressive diuresis.  Recently it has worsened again despite medication. Patient notes no symptoms.  He is alert but does not remember me from prior visits.  Exam notable for Crackles bilaterally with both systolic and diastolic murmurs.  AOX person.  Remembers his son  Labs notable for BNP [phone] EKG SR Personally reviewed relevant tests;  Echo done 9/8; LVEF < 20% with biventricular dysfunction and large pleural effusion  Would recommend  - conservative therapy; discussed with son: he is 41 with dementia and CKD at baseline, will not plan for procedural intervention - starting IV diuretic therapy, if improves we may consider SGLT2i but I am concerned about his UTI hx and dementia hx; high risk of UTIs - when volume improves will likely add back BB - discussed PC with son  Riley Lam, MD FASE [facility] Cardiologist [facility]  [address] [place], [address] [place], [place] [address] [phone]  12:45 PM

## 2022-12-12 NOTE — Progress Notes (Signed)
Assumed care of pt from CN. Condition stable at this. Cont with plan of care. No changes in initial am assessment at this time

## 2022-12-12 NOTE — Progress Notes (Signed)
Heart Failure Navigator Progress Note  Assessed for Heart & Vascular TOC clinic readiness.  Patient does not meet criteria due to Dementia per MD.   Navigator will sign off at this time.    Delcenia Inman,RN, BSN,MSN Heart Failure Nurse Navigator. Contact by secure chat only.

## 2022-12-12 NOTE — Progress Notes (Signed)
[facility] Progress Note Patient: John Rose ZOX:[id number] DOB: [birth year] DOA: 12/10/2022  DOS: the patient was seen and examined on 12/12/2022  Brief hospital course: PMH of CKD 3B, HFpEF, dementia present to the hospital with complaints of cough and shortness of breath. CT scan showed moderate bilateral pleural effusion with compressive atelectasis. Was mildly hypoxic. Admitted he was started on antibiotic and Lasix. Currently being treated for acute systolic CHF  Assessment and Plan: Acute on chronic combined systolic and diastolic CHF Treated with IV Lasix. BNP was elevated. For now does not appear to be volume overloaded. Prior echocardiogram in 2023 showed EF of 6065%. Echocardiogram this admission shows 25% with global hypokinesis, LVH and grade 2 diastolic dysfunction along with RV dysfunction. Cardiology consult appreciated.  Initiated on diuresis.  Not a candidate for aggressive workup.  Elevated troponin. No evidence of EKG changes. Minimally elevated.  And not following ACS dynamics. Most likely in the setting of poor renal clearance versus demand ischemia  Concern for aspiration pneumonia-ruled out. Procalcitonin negative. Patient is on room air. Do not suspect the patient is suffering from any pneumonia or active infection. Monitor.  Hyperkalemia. Receiving diuresis. Will monitor.  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 Monitor.  BPH. On Flomax. Continue.  Dementia with agitation in the hospital. On Seroquel at home as needed.  Will continue.  Deconditioning. Comes from home. PT OT consulted.  Will monitor.  Acute kidney injury on CKD stage IIIb. Serum creatinine 2023 around 1.4. Serum creatinine on admission 1.7. Improving to 1.5. Suspect this is cardiorenal hemodynamics.  Goals of care conversation. Prognosis poor in the setting of cardiomyopathy as well as dementia. Along with CKD stage IIIb. Will place Palliative care  consultation for  further conversation.   Subjective: Shortness of breath improving.  No nausea no vomiting no fever no chills.  Physical Exam: General: in Mild distress, No Rash Cardiovascular: S1 and S2 Present, No Murmur Respiratory: Good respiratory effort, Bilateral Air entry present.  Faint basal crackles, No wheezes Abdomen: Bowel Sound present, No tenderness Extremities: No edema Neuro: Alert and oriented x3, no new focal deficit  Data Reviewed: I have Reviewed nursing notes, Vitals, and Lab results. Since last encounter, pertinent lab results CBC and BMP   . I have ordered test including CBC and BMP  . I have discussed pt's care plan and test results with cardiology  .   Disposition: Status is: Inpatient Remains inpatient appropriate because: Awaiting diuresis response  Place and maintain sequential compression device Start: 12/12/22 1141   Family Communication: Discussed with son on 9/9 Level of care: Telemetry   Vitals:   12/12/22 0500 12/12/22 0540 12/12/22 1406 12/12/22 1652  BP:  124/84 123/78 132/78  Pulse:  73 78 65  Resp:  16 20 14   Temp:  (!) 97.5 F (36.4 C) 98.1 F (36.7 C) (!) 97.2 F (36.2 C)  TempSrc:  Oral Oral Oral  SpO2:  100% 96% 100%  Weight: 72.2 kg     Height:         Author: Lynden Oxford, MD 12/12/2022 8:03 PM  Please look on [email or web address] to find out who is on call.

## 2022-12-12 NOTE — Evaluation (Signed)
Physical Therapy Evaluation Patient Details Name: John Rose MRN: [id number] DOB: [birth year] Today's Date: 12/12/2022  History of Present Illness  89 y.o. male admitted 12/10/22 for productive cough, SOB. Imaging found Cardiomegaly with pulmonary vascular congestion and moderate bilateral pleural effusions, right greater than left. Associated bibasilar opacities, which may represent atelectasis or infection. PMH includes CKD 3B, chronic diastolic CHF, cognitive impairment  Clinical Impression  On eval, pt required Mod A +2 for mobility. He walked ~20 feet with a RW. Pt presents with general weakness, decreased activity tolerance, and impaired gait and balance. No family present during session. Pt is a poor historian. Recommend TOC consult. May benefit from continued rehab-disposition depends on family input. Will plan to follow and progress activity as tolerated.         If plan is discharge home, recommend the following: A lot of help with walking and/or transfers;A lot of help with bathing/dressing/bathroom;Assistance with cooking/housework;Assist for transportation;Direct supervision/assist for financial management;Help with stairs or ramp for entrance   Can travel by private vehicle        Equipment Recommendations  (RW if pt doesn't already have one)  Recommendations for Other Services       Functional Status Assessment Patient has had a recent decline in their functional status and demonstrates the ability to make significant improvements in function in a reasonable and predictable amount of time.     Precautions / Restrictions Precautions Precautions: Fall Precaution Comments: pleasant Restrictions Weight Bearing Restrictions: No      Mobility  Bed Mobility Overal bed mobility: Needs Assistance Bed Mobility: Supine to Sit   Sidelying to sit: Mod assist, +2 for safety/equipment       General bed mobility comments: Increased time. Assist for trunk and bil LEs. Utilized  bedpad to aid with scooting, positioning at EOB.    Transfers Overall transfer level: Needs assistance Equipment used: Rolling walker (2 wheels) Transfers: Sit to/from Stand Sit to Stand: Mod assist, +2 physical assistance, +2 safety/equipment, From elevated surface           General transfer comment: Increased time. Assist to power up, stabilize, control descent. Cues required.    Ambulation/Gait Ambulation/Gait assistance: Min assist, +2 safety/equipment Gait Distance (Feet): 20 Feet Assistive device: Rolling walker (2 wheels) Gait Pattern/deviations: Step-through pattern, Decreased stride length       General Gait Details: Assist to stabilize througout distance. Followed closely with recliner. Fatigues quickly/easily.  Stairs            Wheelchair Mobility     Tilt Bed    Modified Rankin (Stroke Patients Only)       Balance Overall balance assessment: Needs assistance Sitting-balance support: Bilateral upper extremity supported, Feet supported Sitting balance-Leahy Scale: Fair     Standing balance support: Reliant on assistive device for balance, Bilateral upper extremity supported, During functional activity Standing balance-Leahy Scale: Poor                               Pertinent Vitals/Pain Pain Assessment Pain Assessment: Faces Faces Pain Scale: No hurt    Home Living Family/patient expects to be discharged to:: Private residence Living Arrangements: Children Available Help at Discharge: Personal care attendant (possibly per chart review)                    Prior Function Prior Level of Function : Patient poor historian/Family not available  Extremity/Trunk Assessment   Upper Extremity Assessment Upper Extremity Assessment: Defer to OT evaluation    Lower Extremity Assessment Lower Extremity Assessment: Generalized weakness    Cervical / Trunk Assessment Cervical / Trunk Assessment:  Kyphotic  Communication      Cognition Arousal: Alert Behavior During Therapy: WFL for tasks assessed/performed Overall Cognitive Status: History of cognitive impairments - at baseline                                 General Comments: pleasant and cooperative; follows commands        General Comments      Exercises     Assessment/Plan    PT Assessment Patient needs continued PT services  PT Problem List Decreased strength;Decreased range of motion;Decreased activity tolerance;Decreased balance;Decreased mobility;Decreased cognition;Decreased safety awareness       PT Treatment Interventions DME instruction;Gait training;Functional mobility training;Therapeutic activities;Therapeutic exercise;Patient/family education    PT Goals (Current goals can be found in the Care Plan section)  Acute Rehab PT Goals Patient Stated Goal: none stated PT Goal Formulation: Patient unable to participate in goal setting Time For Goal Achievement: 12/26/22 Potential to Achieve Goals: Fair    Frequency Min 1X/week     Co-evaluation               AM-PAC PT "6 Clicks" Mobility  Outcome Measure Help needed turning from your back to your side while in a flat bed without using bedrails?: A Lot Help needed moving from lying on your back to sitting on the side of a flat bed without using bedrails?: A Lot Help needed moving to and from a bed to a chair (including a wheelchair)?: A Lot Help needed standing up from a chair using your arms (e.g., wheelchair or bedside chair)?: A Lot Help needed to walk in hospital room?: A Lot Help needed climbing 3-5 steps with a railing? : Total 6 Click Score: 11    End of Session Equipment Utilized During Treatment: Gait belt Activity Tolerance: Patient tolerated treatment well Patient left: in chair;with call bell/phone within reach;with chair alarm set   PT Visit Diagnosis: Muscle weakness (generalized) (M62.81);Difficulty in walking,  not elsewhere classified (R26.2)    Time: 1610-9604 PT Time Calculation (min) (ACUTE ONLY): 22 min   Charges:   PT Evaluation $PT Eval Low Complexity: 1 Low   PT General Charges $$ ACUTE PT VISIT: 1 Visit            Faye Ramsay, PT Acute Rehabilitation  Office: [phone]

## 2022-12-12 NOTE — TOC Progression Note (Signed)
Transition of Care [facility]) - Progression Note    Patient Details  Name: John Rose MRN: [id number] Date of Birth: 01/08/1923  [facility] CM/SW Contact  [name], [name], RN Phone Number: 12/12/2022, 2:53 PM  Clinical Narrative: ementia-PT recc [facility] SNF-left vm w/[name] for return call back to discuss recc [facility] SNF.Per [facility] used in past.      Expected Discharge Plan: Skilled Nursing Facility Barriers to Discharge: Continued Medical Work up  Expected Discharge Plan and Services                                               Social Determinants of Health (SDOH) Interventions SDOH Screenings   Food Insecurity: No Food Insecurity (12/11/2022)  Housing: Low Risk  (12/11/2022)  Transportation Needs: No Transportation Needs (12/11/2022)  Utilities: Not At Risk (12/11/2022)  Tobacco Use: Low Risk  (12/11/2022)    Readmission Risk Interventions     No data to display

## 2022-12-12 NOTE — Plan of Care (Signed)

## 2022-12-13 DIAGNOSIS — N1832 Chronic kidney disease, stage 3b: Secondary | ICD-10-CM | POA: Diagnosis not present

## 2022-12-13 DIAGNOSIS — N400 Enlarged prostate without lower urinary tract symptoms: Secondary | ICD-10-CM | POA: Diagnosis not present

## 2022-12-13 DIAGNOSIS — I5043 Acute on chronic combined systolic (congestive) and diastolic (congestive) heart failure: Secondary | ICD-10-CM | POA: Diagnosis present

## 2022-12-13 DIAGNOSIS — F0150 Vascular dementia without behavioral disturbance: Secondary | ICD-10-CM | POA: Diagnosis not present

## 2022-12-13 DIAGNOSIS — I5033 Acute on chronic diastolic (congestive) heart failure: Secondary | ICD-10-CM | POA: Diagnosis not present

## 2022-12-13 LAB — CBC
HCT: 37 % — ABNORMAL LOW (ref 39.0–52.0)
Hemoglobin: 11.5 g/dL — ABNORMAL LOW (ref 13.0–17.0)
MCH: 32.8 pg (ref 26.0–34.0)
MCHC: 31.1 g/dL (ref 30.0–36.0)
MCV: 105.4 fL — ABNORMAL HIGH (ref 80.0–100.0)
Platelets: 83 10*3/uL — ABNORMAL LOW (ref 150–400)
RBC: 3.51 MIL/uL — ABNORMAL LOW (ref 4.22–5.81)
RDW: 13.9 % (ref 11.5–15.5)
WBC: 2.5 10*3/uL — ABNORMAL LOW (ref 4.0–10.5)
nRBC: 0 % (ref 0.0–0.2)

## 2022-12-13 LAB — RENAL FUNCTION PANEL
Albumin: 3 g/dL — ABNORMAL LOW (ref 3.5–5.0)
Anion gap: 10 (ref 5–15)
BUN: 31 mg/dL — ABNORMAL HIGH (ref 8–23)
CO2: 25 mmol/L (ref 22–32)
Calcium: 8.7 mg/dL — ABNORMAL LOW (ref 8.9–10.3)
Chloride: 105 mmol/L (ref 98–111)
Creatinine, Ser: 1.65 mg/dL — ABNORMAL HIGH (ref 0.61–1.24)
GFR, Estimated: 37 mL/min — ABNORMAL LOW (ref >60)
Glucose, Bld: 93 mg/dL (ref 70–99)
Phosphorus: 4 mg/dL (ref 2.5–4.6)
Potassium: 4.4 mmol/L (ref 3.5–5.1)
Sodium: 140 mmol/L (ref 135–145)

## 2022-12-13 LAB — MAGNESIUM: Magnesium: 2 mg/dL (ref 1.7–2.4)

## 2022-12-13 NOTE — Progress Notes (Signed)
[facility]                                                                              John Rose, is a 89 y.o. male, DOB - 1922/09/18, WJX:[id number] Admit date - 12/10/2022    Outpatient Primary MD for the patient is [facility]  LOS - 3  days  Chief Complaint  Patient presents with   Cough       Brief summary   Patient is a 89 year old male with dementia, CKD 3B, HFpEF, CKD stage IIIb presented to ED with cough and shortness of breath. CT scan showed moderate bilateral pleural effusion with compressive atelectasis. Was mildly hypoxic.  Patient was admitted for CHF exacerbation, possible pneumonia  Assessment & Plan    Principal Problem:   Acute on chronic combined systolic and diastolic CHF (congestive heart failure) (HCC) -Prior echocardiogram in 2023 had shown EF of 60 to 65%, elevated BNP, moderate bilateral pleural effusions with shortness of breath, LE edema, mildly elevated troponins -2D echo showed EF of 25% with global hypokinesis, LVH, G2 DD, RV dysfunction -Cardiology consulted, plan for conservative therapy with age, dementia and other comorbidities -Continue Lasix 40 mg IV daily, negative balance of 1.2 L, weight 162.4 lbs on admission -> 159 today -Palliative medicine also consulted for GOC  Active Problems: Elevated troponins -Likely due to demand ischemia due to #1, cardiology following   Acute kidney injury on stage 3b chronic kidney disease (HCC) -Baseline creatinine in 2023 around 1.4 -Currently on diuresis, creatinine trended up to 1.6  Concern for aspiration pneumonia -Ruled out, on room air, procalcitonin negative    Dementia (HCC) -Continue Seroquel     BPH (benign prostatic hyperplasia) -Continue Flomax   Estimated body mass index is 24.27 kg/m as calculated from the following:   Height as of this encounter: 5\' 8"  (1.727 m).   Weight as of this encounter: 72.4 kg.  Code Status: Full CODE STATUS DVT  Prophylaxis:  Place and maintain sequential compression device Start: 12/12/22 1141   Level of Care: Level of care: Telemetry Family Communication: Updated patient.  Disposition Plan:      Remains inpatient appropriate: On IV diuresis   Procedures:  2D echo  Consultants:   Cardiology  Antimicrobials:   Anti-infectives (From admission, onward)    Start     Dose/Rate Route Frequency Ordered Stop   12/11/22 0000  Ampicillin-Sulbactam (UNASYN) 3 g in sodium chloride 0.9 % 100 mL IVPB  Status:  Discontinued        3 g 200 mL/hr over 30 Minutes Intravenous Every 12 hours 12/10/22 2351 12/11/22 0810          Medications  furosemide  40 mg Intravenous Daily   gabapentin  300 mg Oral BID   influenza vaccine adjuvanted  0.5 mL Intramuscular Tomorrow-1000   pneumococcal 20-valent conjugate vaccine  0.5 mL Intramuscular Tomorrow-1000   QUEtiapine  25 mg Oral QHS   tamsulosin  0.4 mg Oral Daily      Subjective:   John Rose was seen and examined today.  Somewhat confused, has history of dementia, difficult to obtain ROS  from the patient.  Appears comfortable, no acute chest pain or shortness of breath.   Objective:   Vitals:   12/12/22 2138 12/13/22 0241 12/13/22 0427 12/13/22 0428  BP: 122/83  121/80   Pulse: 84  73   Resp: 18 19 18    Temp: 98.6 F (37 C)  98 F (36.7 C)   TempSrc: Oral  Oral   SpO2: 96%  94%   Weight:    72.4 kg  Height:        Intake/Output Summary (Last 24 hours) at 12/13/2022 1145 Last data filed at 12/13/2022 1016 Gross per 24 hour  Intake 600 ml  Output 1700 ml  Net -1100 ml     Wt Readings from Last 3 Encounters:  12/13/22 72.4 kg  09/07/22 73 kg  03/14/22 71.2 kg     Exam General: Alert and oriented x self, NAD Cardiovascular: S1 S2 auscultated,  RRR Respiratory: bibasilar crackles Gastrointestinal: Soft, nontender, nondistended, + bowel sounds Ext: 1+ pedal edema bilaterally Neuro: moving all 4 extremities Psych:  continue, has dementia    Data Reviewed:  I have personally reviewed following labs    CBC Lab Results  Component Value Date   WBC 2.5 (L) 12/13/2022   RBC 3.51 (L) 12/13/2022   HGB 11.5 (L) 12/13/2022   HCT 37.0 (L) 12/13/2022   MCV 105.4 (H) 12/13/2022   MCH 32.8 12/13/2022   PLT 83 (L) 12/13/2022   MCHC 31.1 12/13/2022   RDW 13.9 12/13/2022   LYMPHSABS 0.9 12/12/2022   MONOABS 0.4 12/12/2022   EOSABS 0.1 12/12/2022   BASOSABS 0.0 12/12/2022     Last metabolic panel Lab Results  Component Value Date   NA 140 12/13/2022   K 4.4 12/13/2022   CL 105 12/13/2022   CO2 25 12/13/2022   BUN 31 (H) 12/13/2022   CREATININE 1.65 (H) 12/13/2022   GLUCOSE 93 12/13/2022   GFRNONAA 37 (L) 12/13/2022   CALCIUM 8.7 (L) 12/13/2022   PHOS 4.0 12/13/2022   PROT 7.1 12/10/2022   ALBUMIN 3.0 (L) 12/13/2022   BILITOT 0.6 12/10/2022   ALKPHOS 79 12/10/2022   AST 37 12/10/2022   ALT 35 12/10/2022   ANIONGAP 10 12/13/2022    CBG (last 3)  No results for input(s): "GLUCAP" in the last 72 hours.    Coagulation Profile: No results for input(s): "INR", "PROTIME" in the last 168 hours.   Radiology Studies: I have personally reviewed the imaging studies  DG Chest 2 View  Result Date: 12/12/2022 CLINICAL DATA:  Pleural effusions. EXAM: CHEST - 2 VIEW COMPARISON:  X-ray 12/10/2022 and older FINDINGS: Moderate bilateral pleural effusions with the adjacent opacities. There is some mild patchy opacity right midlung. No pneumothorax. Enlarged cardiopericardial silhouette with vascular congestion. No pneumothorax. Kyphotic x-ray obscures the apices. Overlapping cardiac leads. Degenerative changes of the spine. IMPRESSION: Moderate pleural effusions with adjacent opacities. The opacities are slightly increased from previous. Enlarged heart with some central vascular congestion. Electronically Signed   By: Karen Kays M.D.   On: 12/12/2022 19:22   ECHOCARDIOGRAM COMPLETE  Result Date:  12/11/2022    ECHOCARDIOGRAM REPORT   Patient Name:   John Rose Date of Exam: 12/11/2022 Medical Rec #:  [id number]      Height:       68.0 in Accession #:    [id number]     Weight:       162.5 lb Date of Birth:  1922/07/06       BSA:  1.871 m Patient Age:    89 years       BP:           149/101 mmHg Patient Gender: M              HR:           77 bpm. Exam Location:  Inpatient Procedure: 2D Echo, Color Doppler and Cardiac Doppler REPORT CONTAINS CRITICAL RESULT Indications:    CHF  History:        Patient has prior history of Echocardiogram examinations, most                 recent 08/05/2021. CHF, CKD; Risk Factors:Dyslipidemia.  Sonographer:    Milbert Coulter Referring Phys: [id number] CAROLE N HALL IMPRESSIONS  1. SVi 19 cc/m2. Left ventricular ejection fraction, by estimation, is 20 to 25%. The left ventricle has severely decreased function. The left ventricle demonstrates global hypokinesis. There is moderate left ventricular hypertrophy. Left ventricular diastolic parameters are consistent with Grade II diastolic dysfunction (pseudonormalization).  2. Right ventricular systolic function is mildly reduced. The right ventricular size is mildly enlarged. There is moderately elevated pulmonary artery systolic pressure. The estimated right ventricular systolic pressure is 53.7 mmHg.  3. Left atrial size was moderately dilated.  4. Right atrial size was moderately dilated.  5. +Liver cyst. a small pericardial effusion is present. Large pleural effusion.  6. Mild mitral valve regurgitation.  7. Tricuspid valve regurgitation is severe.  8. There is moderate calcification of the aortic valve. Aortic valve regurgitation is mild.  9. The inferior vena cava is dilated in size with <50% respiratory variability, suggesting right atrial pressure of 15 mmHg. Conclusion(s)/Recommendation(s): EF has significantly reduced compared to prior study. FINDINGS  Left Ventricle: SVi 19 cc/m2. Left ventricular ejection fraction, by  estimation, is 20 to 25%. The left ventricle has severely decreased function. The left ventricle demonstrates global hypokinesis. The left ventricular internal cavity size was normal in size. There is moderate left ventricular hypertrophy. Left ventricular diastolic parameters are consistent with Grade II diastolic dysfunction (pseudonormalization). Right Ventricle: The right ventricular size is mildly enlarged. Right ventricular systolic function is mildly reduced. There is moderately elevated pulmonary artery systolic pressure. The tricuspid regurgitant velocity is 3.11 m/s, and with an assumed right atrial pressure of 15 mmHg, the estimated right ventricular systolic pressure is 53.7 mmHg. Left Atrium: Left atrial size was moderately dilated. Right Atrium: Right atrial size was moderately dilated. Pericardium: +Liver cyst. A small pericardial effusion is present. Mitral Valve: Mild mitral valve regurgitation. Tricuspid Valve: Tricuspid valve regurgitation is severe. Aortic Valve: There is moderate calcification of the aortic valve. Aortic valve regurgitation is mild. Aortic valve mean gradient measures 1.0 mmHg. Aortic valve peak gradient measures 2.8 mmHg. Aortic valve area, by VTI measures 2.88 cm. Pulmonic Valve: Pulmonic valve regurgitation is mild to moderate. Venous: The inferior vena cava is dilated in size with less than 50% respiratory variability, suggesting right atrial pressure of 15 mmHg. IAS/Shunts: No atrial level shunt detected by color flow Doppler. Additional Comments: There is a large pleural effusion.  LEFT VENTRICLE PLAX 2D LVIDd:         3.80 cm   Diastology LVIDs:         3.60 cm   LV e' medial:    3.15 cm/s LV PW:         1.40 cm   LV E/e' medial:  21.1 LV IVS:        1.30 cm  LV e' lateral:   5.85 cm/s LVOT diam:     2.00 cm   LV E/e' lateral: 11.4 LV SV:         35 LV SV Index:   19 LVOT Area:     3.14 cm  RIGHT VENTRICLE RV Basal diam:  4.50 cm RV Mid diam:    3.50 cm RV S prime:      7.58 cm/s TAPSE (M-mode): 1.2 cm LEFT ATRIUM             Index        RIGHT ATRIUM           Index LA diam:        4.40 cm 2.35 cm/m   RA Area:     24.30 cm LA Vol (A2C):   79.4 ml 42.44 ml/m  RA Volume:   76.50 ml  40.89 ml/m LA Vol (A4C):   61.9 ml 33.08 ml/m LA Biplane Vol: 74.9 ml 40.03 ml/m  AORTIC VALVE AV Area (Vmax):    2.69 cm AV Area (Vmean):   2.56 cm AV Area (VTI):     2.88 cm AV Vmax:           84.40 cm/s AV Vmean:          55.300 cm/s AV VTI:            0.122 m AV Peak Grad:      2.8 mmHg AV Mean Grad:      1.0 mmHg LVOT Vmax:         72.40 cm/s LVOT Vmean:        45.000 cm/s LVOT VTI:          0.112 m LVOT/AV VTI ratio: 0.92  AORTA Ao Root diam: 3.60 cm MITRAL VALVE               TRICUSPID VALVE MV Area (PHT): 4.21 cm    TR Peak grad:   38.7 mmHg MV Decel Time: 180 msec    TR Vmax:        311.00 cm/s MV E velocity: 66.40 cm/s MV A velocity: 48.00 cm/s  SHUNTS MV E/A ratio:  1.38        Systemic VTI:  0.11 m                            Systemic Diam: 2.00 cm Carolan Clines Electronically signed by Carolan Clines Signature Date/Time: 12/11/2022/3:03:24 PM    Final        Thad Ranger M.D. [facility] 12/13/2022, 11:45 AM  Available via Epic secure chat 7am-7pm After 7 pm, please refer to night coverage provider listed on amion.

## 2022-12-13 NOTE — Progress Notes (Signed)
Occupational Therapy Treatment Patient Details Name: John Rose MRN: [id number] DOB: [birth year] Today's Date: 12/13/2022   History of present illness 89 y.o. male admitted 12/10/22 for productive cough, SOB. Imaging found Cardiomegaly with pulmonary vascular congestion and moderate bilateral pleural effusions, right greater than left. Associated bibasilar opacities, which may represent atelectasis or infection. PMH includes CKD 3B, chronic diastolic CHF, cognitive impairment   OT comments  Patient was able to stand with min A with cues for proper hand placement with TD for hygiene tasks with patient unaware of having passed BM. Patient is pleasant and cooperative during session. Patient confused over current location geographically asking If he was near [name] multiple times during session.Patient will benefit from continued inpatient follow up therapy, <3 hours/day.  Patient's discharge plan remains appropriate at this time. OT will continue to follow acutely.        If plan is discharge home, recommend the following:  A lot of help with walking and/or transfers;A lot of help with bathing/dressing/bathroom;Direct supervision/assist for medications management;Direct supervision/assist for financial management;Assist for transportation;Help with stairs or ramp for entrance;Supervision due to cognitive status   Equipment Recommendations  None recommended by OT       Precautions / Restrictions Precautions Precautions: Fall Restrictions Weight Bearing Restrictions: No       Mobility Bed Mobility Overal bed mobility: Needs Assistance Bed Mobility: Supine to Sit   Sidelying to sit: HOB elevated, Max assist, Used rails                 Balance Overall balance assessment: Needs assistance Sitting-balance support: Bilateral upper extremity supported, Feet supported Sitting balance-Leahy Scale: Fair   Postural control: Posterior lean Standing balance support: Reliant on  assistive device for balance, Bilateral upper extremity supported, During functional activity Standing balance-Leahy Scale: Poor       ADL either performed or assessed with clinical judgement   ADL Overall ADL's : Needs assistance/impaired       Toilet Transfer: +2 for physical assistance;+2 for safety/equipment;Minimal assistance;Rolling walker (2 wheels);Stand-pivot Statistician Details (indicate cue type and reason): to recliner in room with RW. patient was able to stand for extended time with cues to keep BUE on handles Toileting- Clothing Manipulation and Hygiene: Sit to/from stand;Total assistance Toileting - Clothing Manipulation Details (indicate cue type and reason): cues to keep BUE on handles of walker to maintain balance. patient attempted to move them towards the front of walker multiple times while standing for hygiene tasks.              Cognition Arousal: Alert Behavior During Therapy: WFL for tasks assessed/performed       General Comments: plesantly confused but very cooperative during session.                   Pertinent Vitals/ Pain       Pain Assessment Pain Assessment: Faces Pain Score: 0-No pain Faces Pain Scale: No hurt         Frequency  Min 1X/week        Progress Toward Goals  OT Goals(current goals can now be found in the care plan section)  Progress towards OT goals: Progressing toward goals     Plan         AM-PAC OT "6 Clicks" Daily Activity     Outcome Measure   Help from another person eating meals?: A Little Help from another person taking care of personal grooming?: A Little Help from another person toileting, which includes  using toliet, bedpan, or urinal?: A Lot Help from another person bathing (including washing, rinsing, drying)?: A Lot Help from another person to put on and taking off regular upper body clothing?: A Little Help from another person to put on and taking off regular lower body clothing?: A  Lot 6 Click Score: 15    End of Session Equipment Utilized During Treatment: Gait belt;Rolling walker (2 wheels)  OT Visit Diagnosis: Unsteadiness on feet (R26.81);Muscle weakness (generalized) (M62.81);History of falling (Z91.81);Other symptoms and signs involving cognitive function   Activity Tolerance Patient tolerated treatment well   Patient Left in chair;with call bell/phone within reach;with chair alarm set   Nurse Communication Mobility status        Time: 1511-1531 OT Time Calculation (min): 20 min  Charges: OT General Charges $OT Visit: 1 Visit OT Treatments $Self Care/Home Management : 8-22 mins  [name], MS Acute Rehabilitation Department Office# [phone]   Selinda Flavin 12/13/2022, 3:42 PM
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
## 2022-12-13 NOTE — ED Provider Notes (Signed)
[facility] C[facility] UROLOGY Provider Note   CSN: [id number] Arrival date & time: 12/10/22  1652     History  Chief Complaint  Patient presents with   Cough    John Rose is a 89 y.o. male.  Patient with a history of congestive heart failure.  He comes in with cough and shortness of breath  The history is provided by the patient and medical records. No language interpreter was used.  Cough Cough characteristics:  Productive Sputum characteristics:  Nondescript Severity:  Moderate Timing:  Constant Progression:  Worsening Chronicity:  New Smoker: no   Context: not animal exposure   Relieved by:  Nothing Associated symptoms: no chest pain, no eye discharge, no headaches and no rash        Home Medications Prior to Admission medications   Medication Sig Start Date End Date Taking? Authorizing Provider  acetaminophen (TYLENOL) 650 MG CR tablet Take 650 mg by mouth every 8 (eight) hours as needed for pain.   Yes [provider]  colchicine 0.6 MG tablet Take 0.6 mg by mouth at bedtime. 08/18/22  Yes [provider]  diclofenac Sodium (VOLTAREN) 1 % GEL Apply 2 g topically every 8 (eight) hours. 10/17/22  Yes [provider]  furosemide (LASIX) 40 MG tablet Take 1 tablet (40 mg total) by mouth daily. 09/07/22  Yes Chandrasekhar, Mahesh A, MD  gabapentin (NEURONTIN) 300 MG capsule Take 300 mg by mouth 2 (two) times daily. 11/19/21  Yes [provider]  hydrocortisone (ANUSOL-HC) 2.5 % rectal cream Apply topically as needed for hemorrhoids or anal itching. 07/18/22  Yes [provider]  Multiple Vitamin (MULTIVITAMIN ADULT PO) Take 1 tablet by mouth daily.   Yes [provider]  potassium chloride SA (KLOR-CON M20) 20 MEQ tablet Take 2 tablets (40 mEq total) by mouth daily. 09/07/22  Yes Chandrasekhar, Mahesh A, MD  QUEtiapine (SEROQUEL) 25 MG tablet Take 12.5-25 mg by mouth at bedtime as needed. 11/10/21   Yes [provider]  simethicone (MYLICON) 80 MG chewable tablet Chew 80 mg by mouth every 6 (six) hours as needed for flatulence.   Yes [provider]  tamsulosin (FLOMAX) 0.4 MG CAPS capsule Take 0.4 mg by mouth daily.  08/16/16  Yes [provider]      Allergies    Patient has no known allergies.    Review of Systems   Review of Systems  Constitutional:  Negative for appetite change and fatigue.  HENT:  Negative for congestion, ear discharge and sinus pressure.   Eyes:  Negative for discharge.  Respiratory:  Positive for cough.   Cardiovascular:  Negative for chest pain.  Gastrointestinal:  Negative for abdominal pain and diarrhea.  Genitourinary:  Negative for frequency and hematuria.  Musculoskeletal:  Negative for back pain.  Skin:  Negative for rash.  Neurological:  Negative for seizures and headaches.  Psychiatric/Behavioral:  Negative for hallucinations.     Physical Exam Updated Vital Signs BP 121/80 (BP Location: Right Arm)   Pulse 73   Temp 98 F (36.7 C) (Oral)   Resp 18   Ht 5\' 8"  (1.727 m)   Wt 72.4 kg   SpO2 94%   BMI 24.27 kg/m  Physical Exam Vitals and nursing note reviewed.  Constitutional:      Appearance: He is well-developed.  HENT:     Head: Normocephalic.     Nose: Nose normal.  Eyes:     General: No scleral  icterus.    Conjunctiva/sclera: Conjunctivae normal.  Neck:     Thyroid: No thyromegaly.  Cardiovascular:     Rate and Rhythm: Normal rate and regular rhythm.     Heart sounds: No murmur heard.    No friction rub. No gallop.  Pulmonary:     Breath sounds: No stridor. No wheezing or rales.  Chest:     Chest wall: No tenderness.  Abdominal:     General: There is no distension.     Tenderness: There is no abdominal tenderness. There is no rebound.  Musculoskeletal:        General: Normal range of motion.     Cervical back: Neck supple.  Lymphadenopathy:     Cervical: No cervical adenopathy.  Skin:     Findings: No erythema or rash.  Neurological:     Mental Status: He is alert and oriented to person, place, and time.     Motor: No abnormal muscle tone.     Coordination: Coordination normal.  Psychiatric:        Behavior: Behavior normal.     ED Results / Procedures / Treatments   Labs (all labs ordered are listed, but only abnormal results are displayed) Labs Reviewed  CBC WITH DIFFERENTIAL/PLATELET - Abnormal; Notable for the following components:      Result Value   WBC 2.7 (*)    RBC 3.87 (*)    Hemoglobin 12.7 (*)    MCV 105.9 (*)    Platelets 85 (*)    Neutro Abs 1.6 (*)    All other components within normal limits  COMPREHENSIVE METABOLIC PANEL - Abnormal; Notable for the following components:   Potassium 5.3 (*)    BUN 36 (*)    Creatinine, Ser 1.73 (*)    GFR, Estimated 35 (*)    All other components within normal limits  BRAIN NATRIURETIC PEPTIDE - Abnormal; Notable for the following components:   B Natriuretic Peptide 2,688.8 (*)    All other components within normal limits  CBC - Abnormal; Notable for the following components:   WBC 3.0 (*)    MCV 103.7 (*)    Platelets 80 (*)    All other components within normal limits  BASIC METABOLIC PANEL - Abnormal; Notable for the following components:   Potassium 5.2 (*)    Glucose, Bld 101 (*)    BUN 34 (*)    Creatinine, Ser 1.53 (*)    GFR, Estimated 41 (*)    All other components within normal limits  CBC WITH DIFFERENTIAL/PLATELET - Abnormal; Notable for the following components:   WBC 2.7 (*)    RBC 3.68 (*)    Hemoglobin 11.9 (*)    HCT 38.7 (*)    MCV 105.2 (*)    Platelets 84 (*)    Neutro Abs 1.3 (*)    All other components within normal limits  BASIC METABOLIC PANEL - Abnormal; Notable for the following components:   Glucose, Bld 100 (*)    BUN 34 (*)    Creatinine, Ser 1.55 (*)    Calcium 8.6 (*)    GFR, Estimated 40 (*)    All other components within normal limits  RENAL FUNCTION PANEL -  Abnormal; Notable for the following components:   BUN 31 (*)    Creatinine, Ser 1.65 (*)    Calcium 8.7 (*)    Albumin 3.0 (*)    GFR, Estimated 37 (*)    All other components within normal limits  CBC - Abnormal; Notable for the following components:   WBC 2.5 (*)    RBC 3.51 (*)    Hemoglobin 11.5 (*)    HCT 37.0 (*)    MCV 105.4 (*)    Platelets 83 (*)    All other components within normal limits  TROPONIN I (HIGH SENSITIVITY) - Abnormal; Notable for the following components:   Troponin I (High Sensitivity) 63 (*)    All other components within normal limits  TROPONIN I (HIGH SENSITIVITY) - Abnormal; Notable for the following components:   Troponin I (High Sensitivity) 63 (*)    All other components within normal limits  TROPONIN I (HIGH SENSITIVITY) - Abnormal; Notable for the following components:   Troponin I (High Sensitivity) 67 (*)    All other components within normal limits  RESP PANEL BY RT-PCR (RSV, FLU A&B, COVID)  RVPGX2  EXPECTORATED SPUTUM ASSESSMENT W GRAM STAIN, RFLX TO RESP C  MAGNESIUM  PHOSPHORUS  PROCALCITONIN  MAGNESIUM  MAGNESIUM    EKG EKG Interpretation Date/Time:  Sunday December 10 2022 20:46:09 EDT Ventricular Rate:  86 PR Interval:  155 QRS Duration:  97 QT Interval:  363 QTC Calculation: 435 R Axis:   -35  Text Interpretation: Sinus rhythm Inferior infarct, old Consider anterior infarct Lateral leads are also involved Confirmed by Tavonte Seybold ([id number]) on 12/10/2022 10:36:03 PM  Radiology DG Chest 2 View  Result Date: 12/12/2022 CLINICAL DATA:  Pleural effusions. EXAM: CHEST - 2 VIEW COMPARISON:  X-ray 12/10/2022 and older FINDINGS: Moderate bilateral pleural effusions with the adjacent opacities. There is some mild patchy opacity right midlung. No pneumothorax. Enlarged cardiopericardial silhouette with vascular congestion. No pneumothorax. Kyphotic x-ray obscures the apices. Overlapping cardiac leads. Degenerative changes of the spine.  IMPRESSION: Moderate pleural effusions with adjacent opacities. The opacities are slightly increased from previous. Enlarged heart with some central vascular congestion. Electronically Signed   By: Ashok  Gupta M.D.   On: 12/12/2022 19:22   ECHOCARDIOGRAM COMPLETE  Result Date: 12/11/2022    ECHOCARDIOGRAM REPORT   Patient Name:   John Rose Date of Exam: 12/11/2022 Medical Rec #:  [id number]      Height:       68.0 in Accession #:    [id number]     Weight:       162.5 lb Date of Birth:  07/21/1922       BSA:          1.871 m Patient Age:    89 years       BP:           149/101 mmHg Patient Gender: M              HR:           77  bpm. Exam Location:  Inpatient Procedure: 2D Echo, Color Doppler and Cardiac Doppler REPORT CONTAINS CRITICAL RESULT Indications:    CHF  History:        Patient has prior history of Echocardiogram examinations, most                 recent 08/05/2021. CHF, CKD; Risk Factors:Dyslipidemia.  Sonographer:    Milbert Coulter Referring Phys: [id number] CAROLE N HALL IMPRESSIONS  1. SVi 19 cc/m2. Left ventricular ejection fraction, by estimation, is 20 to 25%. The left ventricle has severely decreased function. The left ventricle demonstrates global hypokinesis. There is moderate left ventricular hypertrophy. Left ventricular diastolic parameters are consistent with Grade II diastolic dysfunction (pseudonormalization).  2. Right  ventricular systolic function is mildly reduced. The right ventricular size is mildly enlarged. There is moderately elevated pulmonary artery systolic pressure. The estimated right ventricular systolic pressure is 53.7 mmHg.  3. Left atrial size was moderately dilated.  4. Right atrial size was moderately dilated.  5. +Liver cyst. a small pericardial effusion is present. Large pleural effusion.  6. Mild mitral valve regurgitation.  7. Tricuspid valve regurgitation is severe.  8. There is moderate calcification of the aortic valve. Aortic valve regurgitation is mild.  9. The  inferior vena cava is dilated in size with <50% respiratory variability, suggesting right atrial pressure of 15 mmHg. Conclusion(s)/Recommendation(s): EF has significantly reduced compared to prior study. FINDINGS  Left Ventricle: SVi 19 cc/m2. Left ventricular ejection fraction, by estimation, is 20 to 25%. The left ventricle has severely decreased function. The left ventricle demonstrates global hypokinesis. The left ventricular internal cavity size was normal in size. There is moderate left ventricular hypertrophy. Left ventricular diastolic parameters are consistent with Grade II diastolic dysfunction (pseudonormalization). Right Ventricle: The right ventricular size is mildly enlarged. Right ventricular systolic function is mildly reduced. There is moderately elevated pulmonary artery systolic pressure. The tricuspid regurgitant velocity is 3.11 m/s, and with an assumed right atrial pressure of 15 mmHg, the estimated right ventricular systolic pressure is 53.7 mmHg. Left Atrium: Left atrial size was moderately dilated. Right Atrium: Right atrial size was moderately dilated. Pericardium: +Liver cyst. A small pericardial effusion is present. Mitral Valve: Mild mitral valve regurgitation. Tricuspid Valve: Tricuspid valve regurgitation is severe. Aortic Valve: There is moderate calcification of the aortic valve. Aortic valve regurgitation is mild. Aortic valve mean gradient measures 1.0 mmHg. Aortic valve peak gradient measures 2.8 mmHg. Aortic valve area, by VTI measures 2.88 cm. Pulmonic Valve: Pulmonic valve regurgitation is mild to moderate. Venous: The inferior vena cava is dilated in size with less than 50% respiratory variability, suggesting right atrial pressure of 15 mmHg. IAS/Shunts: No atrial level shunt detected by color flow Doppler. Additional Comments: There is a large pleural effusion.  LEFT VENTRICLE PLAX 2D LVIDd:         3.80 cm   Diastology LVIDs:         3.60 cm   LV e' medial:    3.15 cm/s LV  PW:         1.40 cm   LV E/e' medial:  21.1 LV IVS:        1.30 cm   LV e' lateral:   5.85 cm/s LVOT diam:     2.00 cm   LV E/e' lateral: 11.4 LV SV:         35 LV SV Index:   19 LVOT Area:     3.14 cm  RIGHT VENTRICLE RV Basal diam:  4.50 cm RV Mid diam:    3.50 cm RV S prime:     7.58 cm/s TAPSE (M-mode): 1.2 cm LEFT ATRIUM             Index        RIGHT ATRIUM           Index LA diam:        4.40 cm 2.35 cm/m   RA Area:     24.30 cm LA Vol (A2C):   79.4 ml 42.44 ml/m  RA Volume:   76.50 ml  40.89 ml/m LA Vol (A4C):   61.9 ml 33.08 ml/m LA Biplane Vol: 74.9 ml 40.03 ml/m  AORTIC VALVE AV Area (Vmax):  2.69 cm AV Area (Vmean):   2.56 cm AV Area (VTI):     2.88 cm AV Vmax:           84.40 cm/s AV Vmean:          55.300 cm/s AV VTI:            0.122 m AV Peak Grad:      2.8 mmHg AV Mean Grad:      1.0 mmHg LVOT Vmax:         72.40 cm/s LVOT Vmean:        45.000 cm/s LVOT VTI:          0.112 m LVOT/AV VTI ratio: 0.92  AORTA Ao Root diam: 3.60 cm MITRAL VALVE               TRICUSPID VALVE MV Area (PHT): 4.21 cm    TR Peak grad:   38.7 mmHg MV Decel Time: 180 msec    TR Vmax:        311.00 cm/s MV E velocity: 66.40 cm/s MV A velocity: 48.00 cm/s  SHUNTS MV E/A ratio:  1.38        Systemic VTI:  0.11 m                            Systemic Diam: 2.00 cm Carolan Clines Electronically signed by Carolan Clines Signature Date/Time: 12/11/2022/3:03:24 PM    Final     Procedures Procedures    Medications Ordered in ED Medications  tamsulosin (FLOMAX) capsule 0.4 mg (0.4 mg Oral Given 12/13/22 1004)  pneumococcal 20-valent conjugate vaccine (PREVNAR 20) injection 0.5 mL (has no administration in time range)  influenza vaccine adjuvanted (FLUAD) injection 0.5 mL (has no administration in time range)  gabapentin (NEURONTIN) capsule 300 mg (300 mg Oral Given 12/13/22 1004)  acetaminophen (TYLENOL) tablet 650 mg (650 mg Oral Given 12/12/22 2149)    Or  acetaminophen (TYLENOL) suppository 650 mg ( Rectal See  Alternative 12/12/22 2149)  senna-docusate (Senokot-S) tablet 1 tablet (has no administration in time range)  ondansetron (ZOFRAN) tablet 4 mg (has no administration in time range)    Or  ondansetron (ZOFRAN) injection 4 mg (has no administration in time range)  furosemide (LASIX) injection 40 mg (40 mg Intravenous Given 12/13/22 1004)  QUEtiapine (SEROQUEL) tablet 25 mg (25 mg Oral Given 12/12/22 2149)  furosemide (LASIX) injection 40 mg (40 mg Intravenous Given 12/10/22 2332)    ED Course/ Medical Decision Making/ A&P                                 Medical Decision Making Amount and/or Complexity of Data Reviewed Labs: ordered. Radiology: ordered.  Risk Prescription drug management. Decision regarding hospitalization.  This patient presents to the ED for concern of cough and shortness of breath, this involves an extensive number of treatment options, and is a complaint that carries with it a high risk of complications and morbidity.  The differential diagnosis includes pneumonia, congestive heart failure   Co morbidities that complicate the patient evaluation  Congestive heart failure   Additional history obtained:  Additional history obtained from family External records from outside source obtained and reviewed including hospital records   Lab Tests:  I Ordered, and personally interpreted labs.  The pertinent results include: BNP 2689   Imaging Studies ordered:  I ordered imaging studies including  chest x-ray I independently visualized and interpreted imaging which showed heart failure and pleural effusions I agree with the radiologist interpretation   Cardiac Monitoring: / EKG:  The patient was maintained on a cardiac monitor.  I personally viewed and interpreted the cardiac monitored which showed an underlying rhythm of: Normal sinus rhythm   Consultations Obtained:  I requested consultation with the hospitalist,  and discussed lab and imaging findings as  well as pertinent plan - they recommend: Admit   Problem List / ED Course / Critical interventions / Medication management  Shortness of breath and heart failure I ordered medication including Lasix for heart failure Reevaluation of the patient after these medicines showed that the patient improved I have reviewed the patients home medicines and have made adjustments as needed   Social Determinants of Health:  None   Test / Admission - Considered:  None  Patient with cough and shortness of breath with pleural effusion.  Patient has congestive heart failure and will be admitted to medicine        Final Clinical Impression(s) / ED Diagnoses Final diagnoses:  None    Rx / DC Orders ED Discharge Orders     None         Bethann Berkshire, MD 12/13/22 1110

## 2022-12-14 DIAGNOSIS — I5043 Acute on chronic combined systolic (congestive) and diastolic (congestive) heart failure: Secondary | ICD-10-CM | POA: Diagnosis not present

## 2022-12-14 DIAGNOSIS — N400 Enlarged prostate without lower urinary tract symptoms: Secondary | ICD-10-CM | POA: Diagnosis not present

## 2022-12-14 DIAGNOSIS — Z515 Encounter for palliative care: Secondary | ICD-10-CM

## 2022-12-14 DIAGNOSIS — F0150 Vascular dementia without behavioral disturbance: Secondary | ICD-10-CM | POA: Diagnosis not present

## 2022-12-14 DIAGNOSIS — Z7189 Other specified counseling: Secondary | ICD-10-CM

## 2022-12-14 DIAGNOSIS — F03911 Unspecified dementia, unspecified severity, with agitation: Secondary | ICD-10-CM

## 2022-12-14 LAB — BASIC METABOLIC PANEL
Anion gap: 8 (ref 5–15)
BUN: 30 mg/dL — ABNORMAL HIGH (ref 8–23)
CO2: 29 mmol/L (ref 22–32)
Calcium: 8.6 mg/dL — ABNORMAL LOW (ref 8.9–10.3)
Chloride: 99 mmol/L (ref 98–111)
Creatinine, Ser: 1.56 mg/dL — ABNORMAL HIGH (ref 0.61–1.24)
GFR, Estimated: 40 mL/min — ABNORMAL LOW (ref >60)
Glucose, Bld: 135 mg/dL — ABNORMAL HIGH (ref 70–99)
Potassium: 4.2 mmol/L (ref 3.5–5.1)
Sodium: 136 mmol/L (ref 135–145)

## 2022-12-14 MED ORDER — FUROSEMIDE 10 MG/ML IJ SOLN
40.0000 mg | Freq: Two times a day (BID) | INTRAMUSCULAR | Status: DC
  Administered 2022-12-14 – 2022-12-15 (×2): 40 mg via INTRAVENOUS
  Filled 2022-12-14 (×2): qty 4

## 2022-12-14 NOTE — Plan of Care (Signed)
  Problem: Health Behavior/Discharge Planning: Goal: Ability to manage health-related needs will improve Outcome: Progressing   Problem: Clinical Measurements: Goal: Ability to maintain clinical measurements within normal limits will improve Outcome: Progressing Goal: Will remain free from infection Outcome: Progressing Goal: Diagnostic test results will improve Outcome: Progressing Goal: Respiratory complications will improve Outcome: Progressing   Problem: Nutrition: Goal: Adequate nutrition will be maintained Outcome: Progressing   Problem: Coping: Goal: Level of anxiety will decrease Outcome: Progressing   Problem: Pain Managment: Goal: General experience of comfort will improve Outcome: Progressing   Problem: Safety: Goal: Ability to remain free from injury will improve Outcome: Progressing   Problem: Skin Integrity: Goal: Risk for impaired skin integrity will decrease Outcome: Progressing

## 2022-12-14 NOTE — Consult Note (Signed)
Palliative Care Consult Note                                  Date: 12/14/2022   Patient Name: John Rose  DOB: 1922-09-03  MRN: [id number]  Age / Sex: 89 y.o., male  PCP: Patient, No Pcp Per Referring Physician: Cathren Harsh, MD  Reason for Consultation: Establishing goals of care  HPI/Patient Profile: 89 y.o. male  with past medical history of cognitive impairment (?dementia), CAD stage IIIb, and chronic heart failure who presented to the ED on 12/10/2022 with cough and shortness of breath.  CT scan showed moderate bilateral pleural effusions with atelectasis.  BNP was elevated.  Patient was admitted for acute on chronic CHF exacerbation and possible pneumonia (which was later ruled out).  Palliative Medicine was consulted for goals of care.   Subjective:   I have reviewed medical records including EPIC notes, labs and imaging, and assessed the patient at bedside. He is OOB to the recliner, eating lunch. He is pleasantly confused.   I spoke with his son/John Rose by phone  to discuss diagnosis, prognosis, GOC, disposition, and options. John Rose reports he is patient's next of kin as well as his legal guardian.  I introduced Palliative Medicine as specialized medical care for people living with serious illness. It focuses on providing relief from the symptoms and stress of a serious illness.   Created space and opportunity for son to express thoughts and feelings regarding patient's current medical situation. Values and goals of care were attempted to be elicited.  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 He has never been married. John Rose is his only son.   Functional Status: Patient lives in his home in [place]. Son has lived with him for the past 15 months to help with his care.  Son reports patient's functional status has  declined over the past couple weeks.  He was previously ambulatory and able to do basic ADLs.  GOC Discussion: We discussed patient's current illness and what it means in the larger context of his ongoing co-morbidities. Discussed that patient has multiple medical issues including chronic heart failure, chronic kidney disease, and cognitive impairment.    I reviewed with son the echocardiogram results from 9/9. Discussed that EF of 20-25% is consistent with advanced heart failure. Provided education in the natural trajectory of heart failure as a progressive and life-limiting illness.  Discussed that patient is not a candidate for advanced interventions, and that medical management is limited due to his frailty and CKD.  Son reports understanding that current treatment will help "strengthen the heart".  I was able to provide clarification by explaining discussed that Lasix is intended to treat fluid overload and improve symptoms (shortness of breath, swelling, etc).  Hospice and outpatient palliative care services outpatient were explained and offered. Discussion on the difference between Palliative and Hospice care was had per son's request. Palliative care and hospice have similar goals of improved quality of life in the setting of serious illness. However, palliative care may be offered during any phase of a serious illness, while hospice care is offered when a person is expected to live for 6 months or less.   Reviewed with son that patient was evaluated by [facility] outpatient palliative in November 2023. At that time, he was not a candidate for  hospice. Discussed that patient is now eligible for hospice in the setting of advanced heart failure.   We did discuss code status. Encouraged consideration of DNR/DNI status understanding prognosis would be poor in the event of cardiac arrest, as the cause of arrest would be associated with advanced age and chronic illness rather than an acute reversible  event.  Son reports his father has previously expressed desire for resuscitation efforts. He has intended to honor this wish for as long as possible, but does acknowledge his father's current frailty.  He seems to agree that DNR is appropriate however is not ready to make that decision today.  Discussed with son the importance of continued conversation with his father's medical providers regarding overall plan of care and treatment options. Questions and concerns were addressed. "Hard choices" book left at bedside for son to review.     Review of Systems  Unable to perform ROS   Objective:   Primary Diagnoses: Present on Admission:  Dementia (HCC)  Mixed hyperlipidemia  Stage 3b chronic kidney disease (HCC)  BPH (benign prostatic hyperplasia)  Acute on chronic combined systolic and diastolic CHF (congestive heart failure) (HCC)   Physical Exam Vitals reviewed.  Constitutional:      General: He is not in acute distress.    Comments: Frail and chronically ill-appearing  Pulmonary:     Effort: Pulmonary effort is normal.  Neurological:     Mental Status: He is alert. He is confused.  Psychiatric:        Cognition and Memory: Cognition is impaired.     Vital Signs:  BP 123/87 (BP Location: Right Arm)   Pulse 80   Temp 98.3 F (36.8 C) (Oral)   Resp 20   Ht 5\' 8"  (1.727 m)   Wt 71.2 kg   SpO2 96%   BMI 23.87 kg/m   Palliative Assessment/Data: PPS 40%     Assessment & Plan:   SUMMARY OF RECOMMENDATIONS   Full code for now with ongoing discussion Goal is for medical stabilization Patient is eligible for hospice - son is open to the concept but not ready to proceed with a referral at this time PMT will continue to follow  Primary Decision Maker: LEGAL GUARDIAN and son - John Rose  Prognosis:  < 6 months  Discharge Planning:  To Be Determined    Thank you for allowing [place] to participate in the care of Javien Mehlhaff   Time Total: 90  minutes  Greater than 50%  of this time was spent counseling and coordinating care related to the above assessment and plan.  Signed by: Sherlean Foot, NP Palliative Medicine Team  Team Phone # [phone]  For individual providers, please see AMION

## 2022-12-14 NOTE — Plan of Care (Signed)

## 2022-12-14 NOTE — Progress Notes (Signed)
Physical Therapy Treatment Patient Details Name: John Rose MRN: [id number] DOB: [birth year] Today's Date: 12/14/2022   History of Present Illness 89 y.o. male admitted 12/10/22 for productive cough, SOB. Imaging found Cardiomegaly with pulmonary vascular congestion and moderate bilateral pleural effusions, right greater than left. Associated bibasilar opacities, which may represent atelectasis or infection. PMH includes CKD 3B, chronic diastolic CHF, cognitive impairment    PT Comments  Pt pleasantly confused and agreeable to ambulate. Pt requiring increased assist for bed mobility and only tolerating short distance of ambulation due to fatigue. Pt agreeable to remain OOB in recliner end of session.  Patient will benefit from continued inpatient follow up therapy, <3 hours/day if family is not able to provide physical assist upon d/c.     If plan is discharge home, recommend the following: A lot of help with walking and/or transfers;A lot of help with bathing/dressing/bathroom;Assistance with cooking/housework;Assist for transportation;Direct supervision/assist for financial management;Help with stairs or ramp for entrance   Can travel by private vehicle        Equipment Recommendations  Rolling walker (2 wheels)    Recommendations for Other Services       Precautions / Restrictions Precautions Precautions: Fall Restrictions Weight Bearing Restrictions: No     Mobility  Bed Mobility Overal bed mobility: Needs Assistance Bed Mobility: Supine to Sit     Supine to sit: Mod assist, Used rails, HOB elevated, Max assist     General bed mobility comments: cues for self assist, increased time required, assist for trunk upright and scooting to EOB    Transfers Overall transfer level: Needs assistance Equipment used: Rolling walker (2 wheels) Transfers: Sit to/from Stand Sit to Stand: Mod assist, +2 safety/equipment           General transfer comment: verbal cues for hand  placement and weight shifting, assist to rise and control descent    Ambulation/Gait Ambulation/Gait assistance: Min assist, +2 safety/equipment Gait Distance (Feet): 20 Feet Assistive device: Rolling walker (2 wheels) Gait Pattern/deviations: Step-through pattern, Decreased stride length, Trunk flexed Gait velocity: decr     General Gait Details: assist for stability, pt reported fatigue limiting distance, recliner following for safety   Stairs             Wheelchair Mobility     Tilt Bed    Modified Rankin (Stroke Patients Only)       Balance Overall balance assessment: Needs assistance         Standing balance support: Reliant on assistive device for balance, Bilateral upper extremity supported, During functional activity Standing balance-Leahy Scale: Poor                              Cognition Arousal: Alert Behavior During Therapy: WFL for tasks assessed/performed Overall Cognitive Status: No family/caregiver present to determine baseline cognitive functioning                                 General Comments: plesantly confused but very cooperative during session.        Exercises      General Comments        Pertinent Vitals/Pain Pain Assessment Pain Assessment: Faces Faces Pain Scale: No hurt Pain Intervention(s): Monitored during session, Repositioned    Home Living  Prior Function            PT Goals (current goals can now be found in the care plan section) Progress towards PT goals: Progressing toward goals    Frequency    Min 1X/week      PT Plan      Co-evaluation              AM-PAC PT "6 Clicks" Mobility   Outcome Measure  Help needed turning from your back to your side while in a flat bed without using bedrails?: A Lot Help needed moving from lying on your back to sitting on the side of a flat bed without using bedrails?: A Lot Help needed moving  to and from a bed to a chair (including a wheelchair)?: A Lot Help needed standing up from a chair using your arms (e.g., wheelchair or bedside chair)?: A Lot Help needed to walk in hospital room?: A Lot Help needed climbing 3-5 steps with a railing? : Total 6 Click Score: 11    End of Session Equipment Utilized During Treatment: Gait belt Activity Tolerance: Patient tolerated treatment well Patient left: in chair;with call bell/phone within reach;with chair alarm set Nurse Communication: Mobility status PT Visit Diagnosis: Muscle weakness (generalized) (M62.81);Difficulty in walking, not elsewhere classified (R26.2)     Time: 7829-5621 PT Time Calculation (min) (ACUTE ONLY): 12 min  Charges:    $Gait Training: 8-22 mins PT General Charges $$ ACUTE PT VISIT: 1 Visit                     Paulino Door, DPT Physical Therapist Acute Rehabilitation Services Office: [phone]  Janan Halter [place] 12/14/2022, 12:44 PM

## 2022-12-14 NOTE — Progress Notes (Signed)
[facility]                                                                              Kanin Ricketts, is a 89 y.o. male, DOB - 10-Jan-1923, WUJ:[id number] Admit date - 12/10/2022    Outpatient Primary MD for the patient is Patient, No Pcp Per  LOS - 4  days  Chief Complaint  Patient presents with   Cough       Brief summary   Patient is a 89 year old male with dementia, CKD 3B, HFpEF, CKD stage IIIb presented to ED with cough and shortness of breath. CT scan showed moderate bilateral pleural effusion with compressive atelectasis. Was mildly hypoxic.  Patient was admitted for CHF exacerbation, possible pneumonia  Assessment & Plan    Principal Problem:   Acute on chronic combined systolic and diastolic CHF (congestive heart failure) (HCC) -Prior echocardiogram in 2023 had shown EF of 60 to 65%, elevated BNP, moderate bilateral pleural effusions with shortness of breath, LE edema, mildly elevated troponins -2D echo showed EF of 25% with global hypokinesis, LVH, G2 DD, RV dysfunction -Cardiology consulted, plan for conservative therapy with age, dementia and other comorbidities -Continue Lasix 40 mg IV daily, negative balance of 2.8 L  - weight 162.4 lbs on admission -> 156 today -Palliative medicine also consulted for GOC  Active Problems: Elevated troponins -Likely due to demand ischemia due to #1, cardiology following   Acute kidney injury on stage 3b chronic kidney disease (HCC) -Baseline creatinine in 2023 around 1.4 -Currently on diuresis, creatinine trended up to 1.6 -Follow BMET  Concern for aspiration pneumonia -Ruled out, on room air, procalcitonin negative    Dementia (HCC) -Continue Seroquel     BPH (benign prostatic hyperplasia) -Continue Flomax   Estimated body mass index is 23.87 kg/m as calculated from the following:   Height as of this encounter: 5\' 8"  (1.727 m).   Weight as of this encounter: 71.2 kg.  Code Status: Full CODE  STATUS DVT Prophylaxis:  Place and maintain sequential compression device Start: 12/12/22 1141   Level of Care: Level of care: Telemetry Family Communication: Updated patient.  Disposition Plan:      Remains inpatient appropriate: On IV diuresis   Procedures:  2D echo  Consultants:   Cardiology  Antimicrobials:   Anti-infectives (From admission, onward)    Start     Dose/Rate Route Frequency Ordered Stop   12/11/22 0000  Ampicillin-Sulbactam (UNASYN) 3 g in sodium chloride 0.9 % 100 mL IVPB  Status:  Discontinued        3 g 200 mL/hr over 30 Minutes Intravenous Every 12 hours 12/10/22 2351 12/11/22 0810          Medications  furosemide  40 mg Intravenous Daily   gabapentin  300 mg Oral BID   influenza vaccine adjuvanted  0.5 mL Intramuscular Tomorrow-1000   pneumococcal 20-valent conjugate vaccine  0.5 mL Intramuscular Tomorrow-1000   QUEtiapine  25 mg Oral QHS   tamsulosin  0.4 mg Oral Daily      Subjective:   Aaditya Desa was seen and examined today.  Has dementia, no acute complaints otherwise,  feels comfortable.  Difficult to obtain ROS from the patient.     Objective:   Vitals:   12/13/22 0428 12/13/22 1217 12/13/22 2200 12/14/22 0609  BP:  119/81 (!) 126/93 123/87  Pulse:  81 81 80  Resp:  16 20   Temp:  97.8 F (36.6 C) 98.2 F (36.8 C) 98.3 F (36.8 C)  TempSrc:  Oral Oral Oral  SpO2:  100% 96% 96%  Weight: 72.4 kg   71.2 kg  Height:        Intake/Output Summary (Last 24 hours) at 12/14/2022 1241 Last data filed at 12/14/2022 1117 Gross per 24 hour  Intake 480 ml  Output 1650 ml  Net -1170 ml     Wt Readings from Last 3 Encounters:  12/14/22 71.2 kg  09/07/22 73 kg  03/14/22 71.2 kg   Physical Exam General: Alert and awake, NAD, pleasant, has dementia Cardiovascular: S1 S2 clear, RRR.  Respiratory: Diminished BS at the bases Gastrointestinal: Soft, nontender, nondistended, NBS Ext: trace pedal edema bilaterally Neuro: no new  deficits Skin: No rashes Psych: has dementia, pleasant   Data Reviewed:  I have personally reviewed following labs    CBC Lab Results  Component Value Date   WBC 2.5 (L) 12/13/2022   RBC 3.51 (L) 12/13/2022   HGB 11.5 (L) 12/13/2022   HCT 37.0 (L) 12/13/2022   MCV 105.4 (H) 12/13/2022   MCH 32.8 12/13/2022   PLT 83 (L) 12/13/2022   MCHC 31.1 12/13/2022   RDW 13.9 12/13/2022   LYMPHSABS 0.9 12/12/2022   MONOABS 0.4 12/12/2022   EOSABS 0.1 12/12/2022   BASOSABS 0.0 12/12/2022     Last metabolic panel Lab Results  Component Value Date   NA 140 12/13/2022   K 4.4 12/13/2022   CL 105 12/13/2022   CO2 25 12/13/2022   BUN 31 (H) 12/13/2022   CREATININE 1.65 (H) 12/13/2022   GLUCOSE 93 12/13/2022   GFRNONAA 37 (L) 12/13/2022   CALCIUM 8.7 (L) 12/13/2022   PHOS 4.0 12/13/2022   PROT 7.1 12/10/2022   ALBUMIN 3.0 (L) 12/13/2022   BILITOT 0.6 12/10/2022   ALKPHOS 79 12/10/2022   AST 37 12/10/2022   ALT 35 12/10/2022   ANIONGAP 10 12/13/2022    CBG (last 3)  No results for input(s): "GLUCAP" in the last 72 hours.    Coagulation Profile: No results for input(s): "INR", "PROTIME" in the last 168 hours.   Radiology Studies: I have personally reviewed the imaging studies  DG Chest 2 View  Result Date: 12/12/2022 CLINICAL DATA:  Pleural effusions. EXAM: CHEST - 2 VIEW COMPARISON:  X-ray 12/10/2022 and older FINDINGS: Moderate bilateral pleural effusions with the adjacent opacities. There is some mild patchy opacity right midlung. No pneumothorax. Enlarged cardiopericardial silhouette with vascular congestion. No pneumothorax. Kyphotic x-ray obscures the apices. Overlapping cardiac leads. Degenerative changes of the spine. IMPRESSION: Moderate pleural effusions with adjacent opacities. The opacities are slightly increased from previous. Enlarged heart with some central vascular congestion. Electronically Signed   By: Karen Kays M.D.   On: 12/12/2022 19:22        Aleks Nawrot M.D. [facility] 12/14/2022, 12:41 PM  Available via Epic secure chat 7am-7pm After 7 pm, please refer to night coverage provider listed on amion.

## 2022-12-14 NOTE — Progress Notes (Signed)
Progress Note  Patient Name: John Rose Date of Encounter: 12/14/2022 Primary Cardiologist: Christell Constant, MD   Subjective   Pleasant this AM.  No agitation  Vital Signs    Vitals:   12/13/22 0428 12/13/22 1217 12/13/22 2200 12/14/22 0609  BP:  119/81 (!) 126/93 123/87  Pulse:  81 81 80  Resp:  16 20   Temp:  97.8 F (36.6 C) 98.2 F (36.8 C) 98.3 F (36.8 C)  TempSrc:  Oral Oral Oral  SpO2:  100% 96% 96%  Weight: 72.4 kg   71.2 kg  Height:        Intake/Output Summary (Last 24 hours) at 12/14/2022 1146 Last data filed at 12/14/2022 1117 Gross per 24 hour  Intake 480 ml  Output 1650 ml  Net -1170 ml   Filed Weights   12/12/22 0500 12/13/22 0428 12/14/22 0609  Weight: 72.2 kg 72.4 kg 71.2 kg    Physical Exam   Gen: No distress, elderly male  Neck: Elevated JVD Cardiac: No Rubs or Gallops, systolic murmur, RRR +2 radial pulses Respiratory: Clear to auscultation bilaterally; no tachypnea GI: Soft, nontender, non-distended  MS: trace edema;  moves all extremities Integument: Skin feels warm Neuro:  At time of evaluation, alert and oriented to person Psych: Normal affect, patient feels well   Labs   Telemetry:SR with ectopy   Chemistry Recent Labs  Lab 12/10/22 2047 12/11/22 0522 12/12/22 0836 12/13/22 0515  NA 142 141 141 140  K 5.3* 5.2* 4.6 4.4  CL 109 105 105 105  CO2 26 25 30 25   GLUCOSE 88 101* 100* 93  BUN 36* 34* 34* 31*  CREATININE 1.73* 1.53* 1.55* 1.65*  CALCIUM 9.2 9.3 8.6* 8.7*  PROT 7.1  --   --   --   ALBUMIN 3.9  --   --  3.0*  AST 37  --   --   --   ALT 35  --   --   --   ALKPHOS 79  --   --   --   BILITOT 0.6  --   --   --   GFRNONAA 35* 41* 40* 37*  ANIONGAP 7 11 6 10      Hematology Recent Labs  Lab 12/11/22 0522 12/12/22 0836 12/13/22 0515  WBC 3.0* 2.7* 2.5*  RBC 4.33 3.68* 3.51*  HGB 13.9 11.9* 11.5*  HCT 44.9 38.7* 37.0*  MCV 103.7* 105.2* 105.4*  MCH 32.1 32.3 32.8  MCHC 31.0 30.7 31.1  RDW  14.1 14.0 13.9  PLT 80* 84* 83*    Cardiac EnzymesNo results for input(s): "TROPONINI" in the last 168 hours. No results for input(s): "TROPIPOC" in the last 168 hours.   BNP Recent Labs  Lab 12/10/22 2047  BNP 2,688.8*     DDimer No results for input(s): "DDIMER" in the last 168 hours.   Cardiac Studies   Cardiac Studies & Procedures       ECHOCARDIOGRAM  ECHOCARDIOGRAM COMPLETE 12/11/2022  Narrative ECHOCARDIOGRAM REPORT    Patient Name:   John Rose Date of Exam: 12/11/2022 Medical Rec #:  [id number]      Height:       68.0 in Accession #:    [id number]     Weight:       162.5 lb Date of Birth:  [birth year]       BSA:          1.871 m Patient Age:    48 years  BP:           149/101 mmHg Patient Gender: M              HR:           77 bpm. Exam Location:  Inpatient  Procedure: 2D Echo, Color Doppler and Cardiac Doppler  REPORT CONTAINS CRITICAL RESULT  Indications:    CHF  History:        Patient has prior history of Echocardiogram examinations, most recent 08/05/2021. CHF, CKD; Risk Factors:Dyslipidemia.  Sonographer:    Milbert Coulter Referring Phys: [id number] CAROLE N HALL  IMPRESSIONS   1. SVi 19 cc/m2. Left ventricular ejection fraction, by estimation, is 20 to 25%. The left ventricle has severely decreased function. The left ventricle demonstrates global hypokinesis. There is moderate left ventricular hypertrophy. Left ventricular diastolic parameters are consistent with Grade II diastolic dysfunction (pseudonormalization). 2. Right ventricular systolic function is mildly reduced. The right ventricular size is mildly enlarged. There is moderately elevated pulmonary artery systolic pressure. The estimated right ventricular systolic pressure is 53.7 mmHg. 3. Left atrial size was moderately dilated. 4. Right atrial size was moderately dilated. 5. +Liver cyst. a small pericardial effusion is present. Large pleural effusion. 6. Mild mitral valve  regurgitation. 7. Tricuspid valve regurgitation is severe. 8. There is moderate calcification of the aortic valve. Aortic valve regurgitation is mild. 9. The inferior vena cava is dilated in size with <50% respiratory variability, suggesting right atrial pressure of 15 mmHg.  Conclusion(s)/Recommendation(s): EF has significantly reduced compared to prior study.  FINDINGS Left Ventricle: SVi 19 cc/m2. Left ventricular ejection fraction, by estimation, is 20 to 25%. The left ventricle has severely decreased function. The left ventricle demonstrates global hypokinesis. The left ventricular internal cavity size was normal in size. There is moderate left ventricular hypertrophy. Left ventricular diastolic parameters are consistent with Grade II diastolic dysfunction (pseudonormalization).  Right Ventricle: The right ventricular size is mildly enlarged. Right ventricular systolic function is mildly reduced. There is moderately elevated pulmonary artery systolic pressure. The tricuspid regurgitant velocity is 3.11 m/s, and with an assumed right atrial pressure of 15 mmHg, the estimated right ventricular systolic pressure is 53.7 mmHg.  Left Atrium: Left atrial size was moderately dilated.  Right Atrium: Right atrial size was moderately dilated.  Pericardium: +Liver cyst. A small pericardial effusion is present.  Mitral Valve: Mild mitral valve regurgitation.  Tricuspid Valve: Tricuspid valve regurgitation is severe.  Aortic Valve: There is moderate calcification of the aortic valve. Aortic valve regurgitation is mild. Aortic valve mean gradient measures 1.0 mmHg. Aortic valve peak gradient measures 2.8 mmHg. Aortic valve area, by VTI measures 2.88 cm.  Pulmonic Valve: Pulmonic valve regurgitation is mild to moderate.  Venous: The inferior vena cava is dilated in size with less than 50% respiratory variability, suggesting right atrial pressure of 15 mmHg.  IAS/Shunts: No atrial level shunt  detected by color flow Doppler.  Additional Comments: There is a large pleural effusion.   LEFT VENTRICLE PLAX 2D LVIDd:         3.80 cm   Diastology LVIDs:         3.60 cm   LV e' medial:    3.15 cm/s LV PW:         1.40 cm   LV E/e' medial:  21.1 LV IVS:        1.30 cm   LV e' lateral:   5.85 cm/s LVOT diam:     2.00 cm   LV  E/e' lateral: 11.4 LV SV:         35 LV SV Index:   19 LVOT Area:     3.14 cm   RIGHT VENTRICLE RV Basal diam:  4.50 cm RV Mid diam:    3.50 cm RV S prime:     7.58 cm/s TAPSE (M-mode): 1.2 cm  LEFT ATRIUM             Index        RIGHT ATRIUM           Index LA diam:        4.40 cm 2.35 cm/m   RA Area:     24.30 cm LA Vol (A2C):   79.4 ml 42.44 ml/m  RA Volume:   76.50 ml  40.89 ml/m LA Vol (A4C):   61.9 ml 33.08 ml/m LA Biplane Vol: 74.9 ml 40.03 ml/m AORTIC VALVE AV Area (Vmax):    2.69 cm AV Area (Vmean):   2.56 cm AV Area (VTI):     2.88 cm AV Vmax:           84.40 cm/s AV Vmean:          55.300 cm/s AV VTI:            0.122 m AV Peak Grad:      2.8 mmHg AV Mean Grad:      1.0 mmHg LVOT Vmax:         72.40 cm/s LVOT Vmean:        45.000 cm/s LVOT VTI:          0.112 m LVOT/AV VTI ratio: 0.92  AORTA Ao Root diam: 3.60 cm  MITRAL VALVE               TRICUSPID VALVE MV Area (PHT): 4.21 cm    TR Peak grad:   38.7 mmHg MV Decel Time: 180 msec    TR Vmax:        311.00 cm/s MV E velocity: 66.40 cm/s MV A velocity: 48.00 cm/s  SHUNTS MV E/A ratio:  1.38        Systemic VTI:  0.11 m Systemic Diam: 2.00 cm  Carolan Clines Electronically signed by Carolan Clines Signature Date/Time: 12/11/2022/3:03:24 PM    Final              Assessment & Plan    Heart Failure Reduced Ejection Fraction (combined systolic and diastolic) -  acute on chronic - NYHA class I, Stage D, hypervolemic, etiology unclear - will likely BB when euvolemic - Given K I do not think MRA is feasible - given frequent UTI's and dementia non SGLT2i - due to BP,  no ARNI started - he is 15 with dementia, we will plan for conservative therapy; discussed at length with his son Franky Macho; they have seen PC before - continue IV diuresis    For questions or updates, please contact [facility] Please consult [email or web address] for contact info under Cardiology/STEMI.      Riley Lam, MD FASE [facility] Cardiologist [facility]  [address] [place], [address] [place], [place] [address] [phone]  11:46 AM

## 2022-12-15 DIAGNOSIS — D638 Anemia in other chronic diseases classified elsewhere: Secondary | ICD-10-CM | POA: Diagnosis not present

## 2022-12-15 DIAGNOSIS — R54 Age-related physical debility: Secondary | ICD-10-CM | POA: Diagnosis not present

## 2022-12-15 DIAGNOSIS — M159 Polyosteoarthritis, unspecified: Secondary | ICD-10-CM | POA: Diagnosis not present

## 2022-12-15 DIAGNOSIS — N400 Enlarged prostate without lower urinary tract symptoms: Secondary | ICD-10-CM | POA: Diagnosis not present

## 2022-12-15 DIAGNOSIS — F0150 Vascular dementia without behavioral disturbance: Secondary | ICD-10-CM | POA: Diagnosis not present

## 2022-12-15 DIAGNOSIS — I5043 Acute on chronic combined systolic (congestive) and diastolic (congestive) heart failure: Secondary | ICD-10-CM | POA: Diagnosis not present

## 2022-12-15 DIAGNOSIS — I5022 Chronic systolic (congestive) heart failure: Secondary | ICD-10-CM | POA: Diagnosis not present

## 2022-12-15 DIAGNOSIS — F411 Generalized anxiety disorder: Secondary | ICD-10-CM | POA: Diagnosis not present

## 2022-12-15 LAB — BASIC METABOLIC PANEL
Anion gap: 10 (ref 5–15)
BUN: 31 mg/dL — ABNORMAL HIGH (ref 8–23)
CO2: 27 mmol/L (ref 22–32)
Calcium: 8.3 mg/dL — ABNORMAL LOW (ref 8.9–10.3)
Chloride: 100 mmol/L (ref 98–111)
Creatinine, Ser: 1.46 mg/dL — ABNORMAL HIGH (ref 0.61–1.24)
GFR, Estimated: 43 mL/min — ABNORMAL LOW (ref >60)
Glucose, Bld: 82 mg/dL (ref 70–99)
Potassium: 4 mmol/L (ref 3.5–5.1)
Sodium: 137 mmol/L (ref 135–145)

## 2022-12-15 MED ORDER — METOPROLOL SUCCINATE ER 25 MG PO TB24
12.5000 mg | ORAL_TABLET | Freq: Every day | ORAL | Status: DC
  Administered 2022-12-15: 12.5 mg via ORAL
  Filled 2022-12-15 (×2): qty 1

## 2022-12-15 MED ORDER — FUROSEMIDE 10 MG/ML IJ SOLN
60.0000 mg | Freq: Two times a day (BID) | INTRAMUSCULAR | Status: DC
  Administered 2022-12-15 – 2022-12-18 (×7): 60 mg via INTRAVENOUS
  Filled 2022-12-15 (×7): qty 6

## 2022-12-15 NOTE — Plan of Care (Signed)

## 2022-12-15 NOTE — Progress Notes (Signed)
[facility]                                                                              John Rose, is a 89 y.o. male, DOB - 28-Sep-1922, [id number] Admit date - 12/10/2022    Outpatient Primary MD for the patient is Patient, No Pcp Per  LOS - 5  days  Chief Complaint  Patient presents with   Cough       Brief summary   Patient is a 89 year old male with dementia, CKD 3B, HFpEF, CKD stage IIIb presented to ED with cough and shortness of breath. CT scan showed moderate bilateral pleural effusion with compressive atelectasis. Was mildly hypoxic.  Patient was admitted for CHF exacerbation, possible pneumonia  Assessment & Plan    Principal Problem:   Acute on chronic combined systolic and diastolic CHF (congestive heart failure) (HCC) -Prior echocardiogram in 2023 had shown EF of 60 to 65%, elevated BNP, moderate bilateral pleural effusions with shortness of breath, LE edema, mildly elevated troponins -2D echo showed EF of 25% with global hypokinesis, LVH, G2 DD, RV dysfunction -Cardiology consulted, plan for conservative therapy with age, dementia and other comorbidities -Continue IV Lasix, increased to 60 mg twice daily, negative balance of 5.5 L.  - weight 162.4 lbs on admission -> 156 today -Appreciate palliative care for addressing GOC  Active Problems: Elevated troponins -Likely due to demand ischemia due to #1, cardiology following  Acute kidney injury on stage 3b chronic kidney disease (HCC) -Baseline creatinine in 2023 around 1.4 -Creatinine stable and improving, 1.4, close to baseline  Concern for aspiration pneumonia -Ruled out, on room air, procalcitonin negative    Dementia (HCC) -Continue Seroquel     BPH (benign prostatic hyperplasia) -Continue Flomax   Estimated body mass index is 23.8 kg/m as calculated from the following:   Height as of this encounter: 5\' 8"  (1.727 m).   Weight as of this encounter: 71 kg.  Code Status:  Full CODE STATUS DVT Prophylaxis:  Place and maintain sequential compression device Start: 12/12/22 1141   Level of Care: Level of care: Telemetry Family Communication: Updated patient.  Disposition Plan:      Remains inpatient appropriate: On IV diuresis   Procedures:  2D echo  Consultants:   Cardiology  Antimicrobials:   Anti-infectives (From admission, onward)    Start     Dose/Rate Route Frequency Ordered Stop   12/11/22 0000  Ampicillin-Sulbactam (UNASYN) 3 g in sodium chloride 0.9 % 100 mL IVPB  Status:  Discontinued        3 g 200 mL/hr over 30 Minutes Intravenous Every 12 hours 12/10/22 2351 12/11/22 0810          Medications  furosemide  60 mg Intravenous BID   gabapentin  300 mg Oral BID   influenza vaccine adjuvanted  0.5 mL Intramuscular Tomorrow-1000   metoprolol succinate  12.5 mg Oral Daily   pneumococcal 20-valent conjugate vaccine  0.5 mL Intramuscular Tomorrow-1000   QUEtiapine  25 mg Oral QHS   tamsulosin  0.4 mg Oral Daily      Subjective:   John Rose was seen and  examined today.  Pleasant, no acute complaints, good output, negative balance of 5.5 L.  Has dementia, difficult to obtain review of system from the patient, appears comfortable, eating breakfast without any difficulty.  No shortness of breath or chest pain.  Objective:   Vitals:   12/14/22 1339 12/14/22 2047 12/15/22 0557 12/15/22 1259  BP: 111/73 115/82 118/76 (!) 102/57  Pulse: 80 83 87 77  Resp: 18 18 16 16   Temp: (!) 97.5 F (36.4 C) 98.9 F (37.2 C) (!) 97.5 F (36.4 C) 98.7 F (37.1 C)  TempSrc: Oral Oral Oral Oral  SpO2: 98% 98% 94% 95%  Weight:   71 kg   Height:        Intake/Output Summary (Last 24 hours) at 12/15/2022 1501 Last data filed at 12/15/2022 1300 Gross per 24 hour  Intake 480 ml  Output 3200 ml  Net -2720 ml     Wt Readings from Last 3 Encounters:  12/15/22 71 kg  09/07/22 73 kg  03/14/22 71.2 kg    Physical Exam General: Alert,  oriented to self, dementia, pleasant Cardiovascular: S1 S2 clear, RRR.  Respiratory: Diminished breath sound at the bases Gastrointestinal: Soft, nontender, nondistended, NBS Ext: no pedal edema bilaterally Neuro: no new deficits Psych: dementia pleasant   Data Reviewed:  I have personally reviewed following labs    CBC Lab Results  Component Value Date   WBC 2.5 (L) 12/13/2022   RBC 3.51 (L) 12/13/2022   HGB 11.5 (L) 12/13/2022   HCT 37.0 (L) 12/13/2022   MCV 105.4 (H) 12/13/2022   MCH 32.8 12/13/2022   PLT 83 (L) 12/13/2022   MCHC 31.1 12/13/2022   RDW 13.9 12/13/2022   LYMPHSABS 0.9 12/12/2022   MONOABS 0.4 12/12/2022   EOSABS 0.1 12/12/2022   BASOSABS 0.0 12/12/2022     Last metabolic panel Lab Results  Component Value Date   NA 137 12/15/2022   K 4.0 12/15/2022   CL 100 12/15/2022   CO2 27 12/15/2022   BUN 31 (H) 12/15/2022   CREATININE 1.46 (H) 12/15/2022   GLUCOSE 82 12/15/2022   GFRNONAA 43 (L) 12/15/2022   CALCIUM 8.3 (L) 12/15/2022   PHOS 4.0 12/13/2022   PROT 7.1 12/10/2022   ALBUMIN 3.0 (L) 12/13/2022   BILITOT 0.6 12/10/2022   ALKPHOS 79 12/10/2022   AST 37 12/10/2022   ALT 35 12/10/2022   ANIONGAP 10 12/15/2022    CBG (last 3)  No results for input(s): "GLUCAP" in the last 72 hours.    Coagulation Profile: No results for input(s): "INR", "PROTIME" in the last 168 hours.   Radiology Studies: I have personally reviewed the imaging studies  No results found.     Thad Ranger M.D. [facility] 12/15/2022, 3:01 PM  Available via Epic secure chat 7am-7pm After 7 pm, please refer to night coverage provider listed on amion.

## 2022-12-15 NOTE — Care Management Important Message (Signed)
Important Message  Patient Details IM Letter given. Name: John Rose MRN: [id number] Date of Birth: 06-Aug-1922   Medicare Important Message Given:  Yes     Caren Macadam 12/15/2022, 2:24 PM

## 2022-12-15 NOTE — Progress Notes (Signed)
Progress Note  Patient Name: John Rose Date of Encounter: 12/15/2022 Primary Cardiologist: Christell Constant, MD   Subjective   Pleasant this AM.  No agitation.  Still notes cough.   Vital Signs    Vitals:   12/14/22 1339 12/14/22 2047 12/15/22 0557 12/15/22 1259  BP: 111/73 115/82 118/76 (!) 102/57  Pulse: 80 83 87 77  Resp: 18 18 16 16   Temp: (!) 97.5 F (36.4 C) 98.9 F (37.2 C) (!) 97.5 F (36.4 C) 98.7 F (37.1 C)  TempSrc: Oral Oral Oral Oral  SpO2: 98% 98% 94% 95%  Weight:   71 kg   Height:        Intake/Output Summary (Last 24 hours) at 12/15/2022 1348 Last data filed at 12/15/2022 1300 Gross per 24 hour  Intake 480 ml  Output 3200 ml  Net -2720 ml   Filed Weights   12/13/22 0428 12/14/22 0609 12/15/22 0557  Weight: 72.4 kg 71.2 kg 71 kg    Physical Exam   Gen: No distress, elderly male  Neck: Elevated JVD Cardiac: No Rubs or Gallops, systolic murmur, RRR +2 radial pulses Respiratory: Clear to auscultation bilaterally; no tachypnea GI: Soft, nontender, non-distended  MS: no edema;  moves all extremities Integument: Skin feels warm Neuro:  At time of evaluation, alert and oriented to person Psych: Normal affect, patient feels well   Labs   Telemetry:SR with ectopy   Chemistry Recent Labs  Lab 12/10/22 2047 12/11/22 0522 12/13/22 0515 12/14/22 1315 12/15/22 0503  NA 142   < > 140 136 137  K 5.3*   < > 4.4 4.2 4.0  CL 109   < > 105 99 100  CO2 26   < > 25 29 27   GLUCOSE 88   < > 93 135* 82  BUN 36*   < > 31* 30* 31*  CREATININE 1.73*   < > 1.65* 1.56* 1.46*  CALCIUM 9.2   < > 8.7* 8.6* 8.3*  PROT 7.1  --   --   --   --   ALBUMIN 3.9  --  3.0*  --   --   AST 37  --   --   --   --   ALT 35  --   --   --   --   ALKPHOS 79  --   --   --   --   BILITOT 0.6  --   --   --   --   GFRNONAA 35*   < > 37* 40* 43*  ANIONGAP 7   < > 10 8 10    < > = values in this interval not displayed.     Hematology Recent Labs  Lab  12/11/22 0522 12/12/22 0836 12/13/22 0515  WBC 3.0* 2.7* 2.5*  RBC 4.33 3.68* 3.51*  HGB 13.9 11.9* 11.5*  HCT 44.9 38.7* 37.0*  MCV 103.7* 105.2* 105.4*  MCH 32.1 32.3 32.8  MCHC 31.0 30.7 31.1  RDW 14.1 14.0 13.9  PLT 80* 84* 83*    Cardiac EnzymesNo results for input(s): "TROPONINI" in the last 168 hours. No results for input(s): "TROPIPOC" in the last 168 hours.   BNP Recent Labs  Lab 12/10/22 2047  BNP 2,688.8*     DDimer No results for input(s): "DDIMER" in the last 168 hours.   Cardiac Studies   Cardiac Studies & Procedures       ECHOCARDIOGRAM  ECHOCARDIOGRAM COMPLETE 12/11/2022  Narrative ECHOCARDIOGRAM REPORT    Patient  Name:   HILDA STOLLE Date of Exam: 12/11/2022 Medical Rec #:  [id number]      Height:       68.0 in Accession #:    [id number]     Weight:       162.5 lb Date of Birth:  October 02, 1922       BSA:          1.871 m Patient Age:    89 years       BP:           149/101 mmHg Patient Gender: M              HR:           77 bpm. Exam Location:  Inpatient  Procedure: 2D Echo, Color Doppler and Cardiac Doppler  REPORT CONTAINS CRITICAL RESULT  Indications:    CHF  History:        Patient has prior history of Echocardiogram examinations, most recent 08/05/2021. CHF, CKD; Risk Factors:Dyslipidemia.  Sonographer:    Milbert Coulter Referring Phys: [id number] CAROLE N HALL  IMPRESSIONS   1. SVi 19 cc/m2. Left ventricular ejection fraction, by estimation, is 20 to 25%. The left ventricle has severely decreased function. The left ventricle demonstrates global hypokinesis. There is moderate left ventricular hypertrophy. Left ventricular diastolic parameters are consistent with Grade II diastolic dysfunction (pseudonormalization). 2. Right ventricular systolic function is mildly reduced. The right ventricular size is mildly enlarged. There is moderately elevated pulmonary artery systolic pressure. The estimated right ventricular systolic pressure is 53.7  mmHg. 3. Left atrial size was moderately dilated. 4. Right atrial size was moderately dilated. 5. +Liver cyst. a small pericardial effusion is present. Large pleural effusion. 6. Mild mitral valve regurgitation. 7. Tricuspid valve regurgitation is severe. 8. There is moderate calcification of the aortic valve. Aortic valve regurgitation is mild. 9. The inferior vena cava is dilated in size with <50% respiratory variability, suggesting right atrial pressure of 15 mmHg.  Conclusion(s)/Recommendation(s): EF has significantly reduced compared to prior study.  FINDINGS Left Ventricle: SVi 19 cc/m2. Left ventricular ejection fraction, by estimation, is 20 to 25%. The left ventricle has severely decreased function. The left ventricle demonstrates global hypokinesis. The left ventricular internal cavity size was normal in size. There is moderate left ventricular hypertrophy. Left ventricular diastolic parameters are consistent with Grade II diastolic dysfunction (pseudonormalization).  Right Ventricle: The right ventricular size is mildly enlarged. Right ventricular systolic function is mildly reduced. There is moderately elevated pulmonary artery systolic pressure. The tricuspid regurgitant velocity is 3.11 m/s, and with an assumed right atrial pressure of 15 mmHg, the estimated right ventricular systolic pressure is 53.7 mmHg.  Left Atrium: Left atrial size was moderately dilated.  Right Atrium: Right atrial size was moderately dilated.  Pericardium: +Liver cyst. A small pericardial effusion is present.  Mitral Valve: Mild mitral valve regurgitation.  Tricuspid Valve: Tricuspid valve regurgitation is severe.  Aortic Valve: There is moderate calcification of the aortic valve. Aortic valve regurgitation is mild. Aortic valve mean gradient measures 1.0 mmHg. Aortic valve peak gradient measures 2.8 mmHg. Aortic valve area, by VTI measures 2.88 cm.  Pulmonic Valve: Pulmonic valve regurgitation is  mild to moderate.  Venous: The inferior vena cava is dilated in size with less than 50% respiratory variability, suggesting right atrial pressure of 15 mmHg.  IAS/Shunts: No atrial level shunt detected by color flow Doppler.  Additional Comments: There is a large pleural effusion.   LEFT VENTRICLE PLAX 2D LVIDd:  3.80 cm   Diastology LVIDs:         3.60 cm   LV e' medial:    3.15 cm/s LV PW:         1.40 cm   LV E/e' medial:  21.1 LV IVS:        1.30 cm   LV e' lateral:   5.85 cm/s LVOT diam:     2.00 cm   LV E/e' lateral: 11.4 LV SV:         35 LV SV Index:   19 LVOT Area:     3.14 cm   RIGHT VENTRICLE RV Basal diam:  4.50 cm RV Mid diam:    3.50 cm RV S prime:     7.58 cm/s TAPSE (M-mode): 1.2 cm  LEFT ATRIUM             Index        RIGHT ATRIUM           Index LA diam:        4.40 cm 2.35 cm/m   RA Area:     24.30 cm LA Vol (A2C):   79.4 ml 42.44 ml/m  RA Volume:   76.50 ml  40.89 ml/m LA Vol (A4C):   61.9 ml 33.08 ml/m LA Biplane Vol: 74.9 ml 40.03 ml/m AORTIC VALVE AV Area (Vmax):    2.69 cm AV Area (Vmean):   2.56 cm AV Area (VTI):     2.88 cm AV Vmax:           84.40 cm/s AV Vmean:          55.300 cm/s AV VTI:            0.122 m AV Peak Grad:      2.8 mmHg AV Mean Grad:      1.0 mmHg LVOT Vmax:         72.40 cm/s LVOT Vmean:        45.000 cm/s LVOT VTI:          0.112 m LVOT/AV VTI ratio: 0.92  AORTA Ao Root diam: 3.60 cm  MITRAL VALVE               TRICUSPID VALVE MV Area (PHT): 4.21 cm    TR Peak grad:   38.7 mmHg MV Decel Time: 180 msec    TR Vmax:        311.00 cm/s MV E velocity: 66.40 cm/s MV A velocity: 48.00 cm/s  SHUNTS MV E/A ratio:  1.38        Systemic VTI:  0.11 m Systemic Diam: 2.00 cm  Carolan Clines Electronically signed by Carolan Clines Signature Date/Time: 12/11/2022/3:03:24 PM    Final              Assessment & Plan    Heart Failure Reduced Ejection Fraction (combined systolic and diastolic) -  acute on  chronic - NYHA class I, Stage D, hypervolemic, etiology unclear - will likely BB when euvolemic - unable to start MRA or SGLT2i or ARNI - he is 61 with dementia, we will plan for conservative therapy; discussed at length with his son Franky Macho; they have seen PC before - continue IV diuresis - added low dose BB  For questions or updates, please contact [facility] Please consult [email or web address] for contact info under Cardiology/STEMI.      Riley Lam, MD FASE [facility] Cardiologist [facility]  [address] [place], [address] [place], [place] [address] [phone]  [phone]  1:48 PM

## 2022-12-16 DIAGNOSIS — I5043 Acute on chronic combined systolic (congestive) and diastolic (congestive) heart failure: Secondary | ICD-10-CM | POA: Diagnosis not present

## 2022-12-16 DIAGNOSIS — N400 Enlarged prostate without lower urinary tract symptoms: Secondary | ICD-10-CM | POA: Diagnosis not present

## 2022-12-16 DIAGNOSIS — F0150 Vascular dementia without behavioral disturbance: Secondary | ICD-10-CM | POA: Diagnosis not present

## 2022-12-16 LAB — BASIC METABOLIC PANEL
Anion gap: 9 (ref 5–15)
BUN: 33 mg/dL — ABNORMAL HIGH (ref 8–23)
CO2: 32 mmol/L (ref 22–32)
Calcium: 8.6 mg/dL — ABNORMAL LOW (ref 8.9–10.3)
Chloride: 96 mmol/L — ABNORMAL LOW (ref 98–111)
Creatinine, Ser: 1.46 mg/dL — ABNORMAL HIGH (ref 0.61–1.24)
GFR, Estimated: 43 mL/min — ABNORMAL LOW (ref >60)
Glucose, Bld: 90 mg/dL (ref 70–99)
Potassium: 4 mmol/L (ref 3.5–5.1)
Sodium: 137 mmol/L (ref 135–145)

## 2022-12-16 MED ORDER — METOPROLOL SUCCINATE ER 50 MG PO TB24
25.0000 mg | ORAL_TABLET | Freq: Every day | ORAL | Status: DC
  Administered 2022-12-16 – 2022-12-19 (×4): 25 mg via ORAL
  Filled 2022-12-16 (×3): qty 1

## 2022-12-16 NOTE — Progress Notes (Signed)
Progress Note  Patient Name: John Rose Date of Encounter: 12/16/2022 Primary Cardiologist: Christell Constant, MD   Subjective   No events overnight. No distress- no long complains of cough. Creatinine has improved with diuresis.  Vital Signs    Vitals:   12/15/22 0557 12/15/22 1259 12/15/22 2012 12/16/22 0600  BP: 118/76 (!) 102/57 111/70 126/81  Pulse: 87 77 86 87  Resp: 16 16 17 17   Temp: (!) 97.5 F (36.4 C) 98.7 F (37.1 C) (!) 97.5 F (36.4 C) 98.2 F (36.8 C)  TempSrc: Oral Oral Oral Oral  SpO2: 94% 95% 97% 94%  Weight: 71 kg   74.1 kg  Height:        Intake/Output Summary (Last 24 hours) at 12/16/2022 0755 Last data filed at 12/16/2022 0601 Gross per 24 hour  Intake 360 ml  Output 3150 ml  Net -2790 ml   Filed Weights   12/14/22 0609 12/15/22 0557 12/16/22 0600  Weight: 71.2 kg 71 kg 74.1 kg    Physical Exam   Gen: No distress, elderly male  Neck: No resting JVD, JVD with HJR Cardiac: No Rubs or Gallops, no murmur Respiratory: Clear to auscultation bilaterally GI: Soft, nontender, non-distended  MS: no edema  moves all extremities Integument: Skin feels warm Neuro:  At time of evaluation, alert and oriented to person Psych: Normal affect, patient feels well   Labs   Telemetry: SR with one PVC couplet.  One PVC triplet.   Chemistry Recent Labs  Lab 12/10/22 2047 12/11/22 0522 12/13/22 0515 12/14/22 1315 12/15/22 0503 12/16/22 0532  NA 142   < > 140 136 137 137  K 5.3*   < > 4.4 4.2 4.0 4.0  CL 109   < > 105 99 100 96*  CO2 26   < > 25 29 27  32  GLUCOSE 88   < > 93 135* 82 90  BUN 36*   < > 31* 30* 31* 33*  CREATININE 1.73*   < > 1.65* 1.56* 1.46* 1.46*  CALCIUM 9.2   < > 8.7* 8.6* 8.3* 8.6*  PROT 7.1  --   --   --   --   --   ALBUMIN 3.9  --  3.0*  --   --   --   AST 37  --   --   --   --   --   ALT 35  --   --   --   --   --   ALKPHOS 79  --   --   --   --   --   BILITOT 0.6  --   --   --   --   --   GFRNONAA 35*   <  > 37* 40* 43* 43*  ANIONGAP 7   < > 10 8 10 9    < > = values in this interval not displayed.     Hematology Recent Labs  Lab 12/11/22 0522 12/12/22 0836 12/13/22 0515  WBC 3.0* 2.7* 2.5*  RBC 4.33 3.68* 3.51*  HGB 13.9 11.9* 11.5*  HCT 44.9 38.7* 37.0*  MCV 103.7* 105.2* 105.4*  MCH 32.1 32.3 32.8  MCHC 31.0 30.7 31.1  RDW 14.1 14.0 13.9  PLT 80* 84* 83*    Cardiac EnzymesNo results for input(s): "TROPONINI" in the last 168 hours. No results for input(s): "TROPIPOC" in the last 168 hours.   BNP Recent Labs  Lab 12/10/22 2047  BNP 2,688.8*  DDimer No results for input(s): "DDIMER" in the last 168 hours.   Cardiac Studies   Cardiac Studies & Procedures       ECHOCARDIOGRAM  ECHOCARDIOGRAM COMPLETE 12/11/2022  Narrative ECHOCARDIOGRAM REPORT    Patient Name:   John Rose Date of Exam: 12/11/2022 Medical Rec #:  [id number]      Height:       68.0 in Accession #:    [id number]     Weight:       162.5 lb Date of Birth:  19-Oct-1922       BSA:          1.871 m Patient Age:    89 years       BP:           149/101 mmHg Patient Gender: M              HR:           77 bpm. Exam Location:  Inpatient  Procedure: 2D Echo, Color Doppler and Cardiac Doppler  REPORT CONTAINS CRITICAL RESULT  Indications:    CHF  History:        Patient has prior history of Echocardiogram examinations, most recent 08/05/2021. CHF, CKD; Risk Factors:Dyslipidemia.  Sonographer:    Milbert Coulter Referring Phys: [id number] CAROLE N HALL  IMPRESSIONS   1. SVi 19 cc/m2. Left ventricular ejection fraction, by estimation, is 20 to 25%. The left ventricle has severely decreased function. The left ventricle demonstrates global hypokinesis. There is moderate left ventricular hypertrophy. Left ventricular diastolic parameters are consistent with Grade II diastolic dysfunction (pseudonormalization). 2. Right ventricular systolic function is mildly reduced. The right ventricular size is mildly  enlarged. There is moderately elevated pulmonary artery systolic pressure. The estimated right ventricular systolic pressure is 53.7 mmHg. 3. Left atrial size was moderately dilated. 4. Right atrial size was moderately dilated. 5. +Liver cyst. a small pericardial effusion is present. Large pleural effusion. 6. Mild mitral valve regurgitation. 7. Tricuspid valve regurgitation is severe. 8. There is moderate calcification of the aortic valve. Aortic valve regurgitation is mild. 9. The inferior vena cava is dilated in size with <50% respiratory variability, suggesting right atrial pressure of 15 mmHg.  Conclusion(s)/Recommendation(s): EF has significantly reduced compared to prior study.  FINDINGS Left Ventricle: SVi 19 cc/m2. Left ventricular ejection fraction, by estimation, is 20 to 25%. The left ventricle has severely decreased function. The left ventricle demonstrates global hypokinesis. The left ventricular internal cavity size was normal in size. There is moderate left ventricular hypertrophy. Left ventricular diastolic parameters are consistent with Grade II diastolic dysfunction (pseudonormalization).  Right Ventricle: The right ventricular size is mildly enlarged. Right ventricular systolic function is mildly reduced. There is moderately elevated pulmonary artery systolic pressure. The tricuspid regurgitant velocity is 3.11 m/s, and with an assumed right atrial pressure of 15 mmHg, the estimated right ventricular systolic pressure is 53.7 mmHg.  Left Atrium: Left atrial size was moderately dilated.  Right Atrium: Right atrial size was moderately dilated.  Pericardium: +Liver cyst. A small pericardial effusion is present.  Mitral Valve: Mild mitral valve regurgitation.  Tricuspid Valve: Tricuspid valve regurgitation is severe.  Aortic Valve: There is moderate calcification of the aortic valve. Aortic valve regurgitation is mild. Aortic valve mean gradient measures 1.0 mmHg. Aortic  valve peak gradient measures 2.8 mmHg. Aortic valve area, by VTI measures 2.88 cm.  Pulmonic Valve: Pulmonic valve regurgitation is mild to moderate.  Venous: The inferior vena cava is dilated in size with  less than 50% respiratory variability, suggesting right atrial pressure of 15 mmHg.  IAS/Shunts: No atrial level shunt detected by color flow Doppler.  Additional Comments: There is a large pleural effusion.   LEFT VENTRICLE PLAX 2D LVIDd:         3.80 cm   Diastology LVIDs:         3.60 cm   LV e' medial:    3.15 cm/s LV PW:         1.40 cm   LV E/e' medial:  21.1 LV IVS:        1.30 cm   LV e' lateral:   5.85 cm/s LVOT diam:     2.00 cm   LV E/e' lateral: 11.4 LV SV:         35 LV SV Index:   19 LVOT Area:     3.14 cm   RIGHT VENTRICLE RV Basal diam:  4.50 cm RV Mid diam:    3.50 cm RV S prime:     7.58 cm/s TAPSE (M-mode): 1.2 cm  LEFT ATRIUM             Index        RIGHT ATRIUM           Index LA diam:        4.40 cm 2.35 cm/m   RA Area:     24.30 cm LA Vol (A2C):   79.4 ml 42.44 ml/m  RA Volume:   76.50 ml  40.89 ml/m LA Vol (A4C):   61.9 ml 33.08 ml/m LA Biplane Vol: 74.9 ml 40.03 ml/m AORTIC VALVE AV Area (Vmax):    2.69 cm AV Area (Vmean):   2.56 cm AV Area (VTI):     2.88 cm AV Vmax:           84.40 cm/s AV Vmean:          55.300 cm/s AV VTI:            0.122 m AV Peak Grad:      2.8 mmHg AV Mean Grad:      1.0 mmHg LVOT Vmax:         72.40 cm/s LVOT Vmean:        45.000 cm/s LVOT VTI:          0.112 m LVOT/AV VTI ratio: 0.92  AORTA Ao Root diam: 3.60 cm  MITRAL VALVE               TRICUSPID VALVE MV Area (PHT): 4.21 cm    TR Peak grad:   38.7 mmHg MV Decel Time: 180 msec    TR Vmax:        311.00 cm/s MV E velocity: 66.40 cm/s MV A velocity: 48.00 cm/s  SHUNTS MV E/A ratio:  1.38        Systemic VTI:  0.11 m Systemic Diam: 2.00 cm  Carolan Clines Electronically signed by Carolan Clines Signature Date/Time: 12/11/2022/3:03:24  PM    Final              Assessment & Plan    Heart Failure Reduced Ejection Fraction (combined systolic and diastolic) -  acute on chronic, with PVCs - he is 71 with dementia, we will plan for conservative, PC consulted this admission  - NYHA class I, Stage D, hypervolemic, etiology unclear - repeat lasix 60 IV BID today; BNP tomorrow with transition to lasix 40 mg PO BID - increase to succinate 25 mg PO daily -  No ARNI/ARB/ACEi due to BP - no SGLT2i due to high risk of UTI - K has been relatively high, MRA deferred to outpatient - not a candidate for advanced or aggressive therapies due to dementia (see prior notes for family discussion)  For questions or updates, please contact [facility] Please consult [email or web address] for contact info under Cardiology/STEMI.      Riley Lam, MD FASE [facility] Cardiologist [facility]  [address] [place], [address] [place], [place] [address] [phone]  7:55 AM

## 2022-12-16 NOTE — Progress Notes (Signed)
Palliative Medicine Progress Note   Patient Name: John Rose       Date: 12/16/2022 DOB: 07/06/1922  Age: 89 y.o. MRN#: [id number] Attending Physician: Cathren Harsh, MD Primary Care Physician: Patient, No Pcp Per Admit Date: 12/10/2022  Reason for Consultation/Follow-up: {Reason for Consult:23484}  HPI/Patient Profile: 89 y.o. male  with past medical history of cognitive impairment (?dementia), CAD stage IIIb, and chronic heart failure who presented to the ED on 12/10/2022 with cough and shortness of breath.  CT scan showed moderate bilateral pleural effusions with atelectasis.  BNP was elevated.  Patient was admitted for acute on chronic CHF exacerbation and possible pneumonia (which was later ruled out).   Palliative Medicine was consulted for goals of care.  Subjective:   I spoke with son/Linwood by phone. He reports that after further consideration, he has decided that DNR/DNI status is appropriate.   Objective:  Physical Exam          Vital Signs: BP (!) 93/58 (BP Location: Left Arm)   Pulse 72   Temp 97.6 F (36.4 C) (Oral)   Resp 17   Ht 5\' 8"  (1.727 m)   Wt 74.1 kg   SpO2 96%   BMI 24.84 kg/m  SpO2: SpO2: 96 % O2 Device: O2 Device: Room Air O2 Flow Rate: O2 Flow Rate (L/min): 1 L/min  Intake/output summary:  Intake/Output Summary (Last 24 hours) at 12/16/2022 1655 Last data filed at 12/16/2022 1341 Gross per 24 hour  Intake 600 ml  Output 3350 ml  Net -2750 ml    LBM: Last BM Date : 12/15/22     Palliative Assessment/Data: ***     Palliative Medicine Assessment & Plan   Assessment: Principal Problem:   Acute on chronic combined systolic and diastolic CHF (congestive heart failure) (HCC) Active Problems:   Mixed hyperlipidemia   Stage 3b chronic  kidney disease (HCC)   Dementia (HCC)   BPH (benign prostatic hyperplasia)    Recommendations/Plan: Code status changed to DNR/DNI Goal is for medical stabilization Patient is eligible for hospice - son is open to the concept but not ready to proceed with a referral at this time PMT will continue to follow  Primary Decision Maker: LEGAL GUARDIAN and son - Energy manager   Goals of Care and Additional Recommendations: Limitations on Scope of Treatment: {  Recommended Scope and Preferences:21019}  Code Status:   Prognosis:  {Palliative Care Prognosis:23504}  Discharge Planning: {Palliative dispostion:23505}  Care plan was discussed with ***  Thank you for allowing the Palliative Medicine Team to assist in the care of this patient.   ***   Merry Proud, NP   Please contact Palliative Medicine Team phone at [phone] for questions and concerns.  For individual providers, please see AMION.

## 2022-12-16 NOTE — Progress Notes (Signed)
[facility]                                                                              John Rose, is a 89 y.o. male, DOB - [birth year], GMW:[id number] Admit date - 12/10/2022    Outpatient Primary MD for the patient is Patient, No Pcp Per  LOS - 6  days  Chief Complaint  Patient presents with   Cough       Brief summary   Patient is a 89 year old male with dementia, CKD 3B, HFpEF, CKD stage IIIb presented to ED with cough and shortness of breath. CT scan showed moderate bilateral pleural effusion with compressive atelectasis. Was mildly hypoxic.  Patient was admitted for CHF exacerbation, possible pneumonia  Assessment & Plan    Principal Problem:   Acute on chronic combined systolic and diastolic CHF (congestive heart failure) (HCC) -Prior echocardiogram in 2023 had shown EF of 60 to 65%, elevated BNP, moderate bilateral pleural effusions with shortness of breath, LE edema, mildly elevated troponins -2D echo showed EF of 25% with global hypokinesis, LVH, G2 DD, RV dysfunction -Cardiology consulted, plan for conservative therapy with age, dementia and other comorbidities -Continue IV Lasix for diuresis, on 60 mg IV twice daily, negative balance of 7.4 L  -Appreciate palliative care for addressing GOC  Active Problems: Elevated troponins -Likely due to demand ischemia due to #1, cardiology following  Acute kidney injury on stage 3b chronic kidney disease (HCC) -Baseline creatinine in 2023 around 1.4 -Creatinine stable 1.4  Concern for aspiration pneumonia -Ruled out, on room air, procalcitonin negative    Dementia (HCC) -Continue Seroquel     BPH (benign prostatic hyperplasia) -Continue Flomax   Estimated body mass index is 24.84 kg/m as calculated from the following:   Height as of this encounter: 5\' 8"  (1.727 m).   Weight as of this encounter: 74.1 kg.  Code Status: Full CODE STATUS DVT Prophylaxis:  Place and maintain sequential  compression device Start: 12/12/22 1141   Level of Care: Level of care: Telemetry Family Communication: Updated patient.  Called patient's son however not able to make contact. Disposition Plan:      Remains inpatient appropriate: Currently on IV diuresis.  PT recommending SNF however patient from home with caregiver   Procedures:  2D echo  Consultants:   Cardiology  Antimicrobials:   Anti-infectives (From admission, onward)    Start     Dose/Rate Route Frequency Ordered Stop   12/11/22 0000  Ampicillin-Sulbactam (UNASYN) 3 g in sodium chloride 0.9 % 100 mL IVPB  Status:  Discontinued        3 g 200 mL/hr over 30 Minutes Intravenous Every 12 hours 12/10/22 2351 12/11/22 0810          Medications  furosemide  60 mg Intravenous BID   gabapentin  300 mg Oral BID   influenza vaccine adjuvanted  0.5 mL Intramuscular Tomorrow-1000   metoprolol succinate  25 mg Oral Daily   pneumococcal 20-valent conjugate vaccine  0.5 mL Intramuscular Tomorrow-1000   QUEtiapine  25 mg Oral QHS   tamsulosin  0.4 mg Oral Daily  Subjective:   John Rose was seen and examined today.  Pleasant, was reading Bible prior to encounter.  No acute complaints.  Feeling better, no chest pain or shortness of breath.    Objective:   Vitals:   12/15/22 1259 12/15/22 2012 12/16/22 0600 12/16/22 1314  BP: (!) 102/57 111/70 126/81 (!) 93/58  Pulse: 77 86 87 72  Resp: 16 17 17    Temp: 98.7 F (37.1 C) (!) 97.5 F (36.4 C) 98.2 F (36.8 C) 97.6 F (36.4 C)  TempSrc: Oral Oral Oral Oral  SpO2: 95% 97% 94% 96%  Weight:   74.1 kg   Height:        Intake/Output Summary (Last 24 hours) at 12/16/2022 1315 Last data filed at 12/16/2022 0917 Gross per 24 hour  Intake 360 ml  Output 2250 ml  Net -1890 ml     Wt Readings from Last 3 Encounters:  12/16/22 74.1 kg  09/07/22 73 kg  03/14/22 71.2 kg   Physical Exam General: Alert and oriented x self, NAD, pleasant Cardiovascular: S1 S2  clear, RRR.  Respiratory: CTAB, no wheezing, rales Gastrointestinal: Soft, nontender, nondistended, NBS Ext: no pedal edema bilaterally Psych: dementia  Data Reviewed:  I have personally reviewed following labs    CBC Lab Results  Component Value Date   WBC 2.5 (L) 12/13/2022   RBC 3.51 (L) 12/13/2022   HGB 11.5 (L) 12/13/2022   HCT 37.0 (L) 12/13/2022   MCV 105.4 (H) 12/13/2022   MCH 32.8 12/13/2022   PLT 83 (L) 12/13/2022   MCHC 31.1 12/13/2022   RDW 13.9 12/13/2022   LYMPHSABS 0.9 12/12/2022   MONOABS 0.4 12/12/2022   EOSABS 0.1 12/12/2022   BASOSABS 0.0 12/12/2022     Last metabolic panel Lab Results  Component Value Date   NA 137 12/16/2022   K 4.0 12/16/2022   CL 96 (L) 12/16/2022   CO2 32 12/16/2022   BUN 33 (H) 12/16/2022   CREATININE 1.46 (H) 12/16/2022   GLUCOSE 90 12/16/2022   GFRNONAA 43 (L) 12/16/2022   CALCIUM 8.6 (L) 12/16/2022   PHOS 4.0 12/13/2022   PROT 7.1 12/10/2022   ALBUMIN 3.0 (L) 12/13/2022   BILITOT 0.6 12/10/2022   ALKPHOS 79 12/10/2022   AST 37 12/10/2022   ALT 35 12/10/2022   ANIONGAP 9 12/16/2022    CBG (last 3)  No results for input(s): "GLUCAP" in the last 72 hours.    Coagulation Profile: No results for input(s): "INR", "PROTIME" in the last 168 hours.   Radiology Studies: I have personally reviewed the imaging studies  No results found.     Thad Ranger M.D. [facility] 12/16/2022, 1:15 PM  Available via Epic secure chat 7am-7pm After 7 pm, please refer to night coverage provider listed on amion.

## 2022-12-16 NOTE — Progress Notes (Signed)
Mobility Specialist - Progress Note   12/16/22 1330  Mobility  Activity Transferred from bed to chair  Level of Assistance +2 (takes two people)  Location manager Ambulated (ft) 2 ft  Range of Motion/Exercises Active Assistive  Activity Response Tolerated well  Mobility Referral Yes  $Mobility charge 1 Mobility  Mobility Specialist Start Time (ACUTE ONLY) 1314  Mobility Specialist Stop Time (ACUTE ONLY) 1330  Mobility Specialist Time Calculation (min) (ACUTE ONLY) 16 min   Pt was found in bed and agreeable to transfer to recliner chair. NT in room and assisted with transfer. Once pt went from STS found to be soiled. Assisted in pericare of pt. Afterwards pt able yo do x20 marches at bedside. At EOS was left on recliner chair with all needs met.NT in room.  Billey Chang Mobility Specialist

## 2022-12-17 DIAGNOSIS — I5043 Acute on chronic combined systolic (congestive) and diastolic (congestive) heart failure: Secondary | ICD-10-CM | POA: Diagnosis not present

## 2022-12-17 LAB — BASIC METABOLIC PANEL
Anion gap: 12 (ref 5–15)
BUN: 33 mg/dL — ABNORMAL HIGH (ref 8–23)
CO2: 28 mmol/L (ref 22–32)
Calcium: 8.8 mg/dL — ABNORMAL LOW (ref 8.9–10.3)
Chloride: 97 mmol/L — ABNORMAL LOW (ref 98–111)
Creatinine, Ser: 1.28 mg/dL — ABNORMAL HIGH (ref 0.61–1.24)
GFR, Estimated: 51 mL/min — ABNORMAL LOW (ref >60)
Glucose, Bld: 87 mg/dL (ref 70–99)
Potassium: 4 mmol/L (ref 3.5–5.1)
Sodium: 137 mmol/L (ref 135–145)

## 2022-12-17 LAB — BRAIN NATRIURETIC PEPTIDE: B Natriuretic Peptide: 1573.8 pg/mL — ABNORMAL HIGH (ref 0.0–100.0)

## 2022-12-17 NOTE — Progress Notes (Signed)
Occupational Therapy Treatment Patient Details Name: John Rose MRN: [id number] DOB: 1922/08/03 Today's Date: 12/17/2022   History of present illness 89 y.o. male admitted 12/10/22 for productive cough, SOB. Imaging found Cardiomegaly with pulmonary vascular congestion and moderate bilateral pleural effusions, right greater than left. Associated bibasilar opacities, which may represent atelectasis or infection. PMH includes CKD 3B, chronic diastolic CHF, cognitive impairment   OT comments  Patient was noted to need less physical A for sit to stand and to transfer to recliner in room on this date with second person present for safety. Patient was able to complete sit to stands from recliner with CGA with cues/education for safety and proper hand placement. Patient will benefit from continued inpatient follow up therapy, <3 hours/day. Patient's discharge plan remains appropriate at this time. OT will continue to follow acutely.        If plan is discharge home, recommend the following:  A lot of help with walking and/or transfers;A lot of help with bathing/dressing/bathroom;Direct supervision/assist for medications management;Direct supervision/assist for financial management;Assist for transportation;Help with stairs or ramp for entrance;Supervision due to cognitive status         Precautions / Restrictions Precautions Precautions: Fall Restrictions Weight Bearing Restrictions: No              ADL either performed or assessed with clinical judgement   ADL Overall ADL's : Needs assistance/impaired     Grooming: Wash/dry hands;Wash/dry face;Set up;Sitting;Cueing for sequencing;Oral care Grooming Details (indicate cue type and reason): in recliner                 Toilet Transfer: +2 for safety/equipment;Minimal assistance;Rolling walker (2 wheels);Stand-pivot Statistician Details (indicate cue type and reason): patient was able to stand with less physical A today with  better control to transition into recliner                  Cognition Arousal: Alert Behavior During Therapy: [employer] for tasks assessed/performed Overall Cognitive Status: No family/caregiver present to determine baseline cognitive functioning         General Comments: plesantly confused but very cooperative during session.        Exercises Other Exercises Other Exercises: patient completed 5 sit to stands with CGA with increased time and cues for proper hand placement to slow down getting back into recliner.            Pertinent Vitals/ Pain       Pain Assessment Pain Assessment: Faces Faces Pain Scale: No hurt         Frequency  Min 1X/week        Progress Toward Goals  OT Goals(current goals can now be found in the care plan section)  Progress towards OT goals: Progressing toward goals     Plan         AM-PAC OT "6 Clicks" Daily Activity     Outcome Measure   Help from another person eating meals?: A Little Help from another person taking care of personal grooming?: A Little Help from another person toileting, which includes using toliet, bedpan, or urinal?: A Lot Help from another person bathing (including washing, rinsing, drying)?: A Lot Help from another person to put on and taking off regular upper body clothing?: A Little Help from another person to put on and taking off regular lower body clothing?: A Lot 6 Click Score: 15    End of Session Equipment Utilized During Treatment: Gait belt;Rolling walker (2 wheels)  OT Visit  Diagnosis: Unsteadiness on feet (R26.81);Muscle weakness (generalized) (M62.81);History of falling (Z91.81);Other symptoms and signs involving cognitive function   Activity Tolerance Patient tolerated treatment well   Patient Left in chair;with call bell/phone within reach;with chair alarm set   Nurse Communication Mobility status        Time: 9629-5284 OT Time Calculation (min): 17 min  Charges: OT General  Charges $OT Visit: 1 Visit OT Treatments $Self Care/Home Management : 8-22 mins  [name], MS Acute Rehabilitation Department Office# [phone]   Selinda Flavin 12/17/2022, 11:03 AM

## 2022-12-17 NOTE — Progress Notes (Signed)
Physical Therapy Treatment Patient Details Name: John Rose MRN: [id number] DOB: 1922/10/08 Today's Date: 12/17/2022   History of Present Illness 89 y.o. male admitted 12/10/22 for productive cough, SOB. Imaging found Cardiomegaly with pulmonary vascular congestion and moderate bilateral pleural effusions, right greater than left. Associated bibasilar opacities, which may represent atelectasis or infection. PMH includes CKD 3B, chronic diastolic CHF, cognitive impairment    PT Comments  Moving better today.  Up in chair upon arrival and was able to ambulate short distance with 1 person assist. Per chart, family wants to take patient home.  If they can provide 24/7 physical assistance then feel that is a safe plan. Would recommend HHPT if they take him home.  He does need 24/7 supervision for safety.   If plan is discharge home, recommend the following: A lot of help with walking and/or transfers;A lot of help with bathing/dressing/bathroom;Assist for transportation;Help with stairs or ramp for entrance;Supervision due to cognitive status   Can travel by private vehicle        Equipment Recommendations  Rolling walker (2 wheels)    Recommendations for Other Services       Precautions / Restrictions Precautions Precautions: Fall Restrictions Weight Bearing Restrictions: No     Mobility  Bed Mobility               General bed mobility comments: up in recliner upon arrival.    Transfers Overall transfer level: Needs assistance Equipment used: Rolling walker (2 wheels) Transfers: Sit to/from Stand Sit to Stand: Min assist           General transfer comment: sit to stand multiple reps from chair with multi-modal cues for hand placement.    Ambulation/Gait Ambulation/Gait assistance: Min assist Gait Distance (Feet): 5 Feet Assistive device: Rolling walker (2 wheels) Gait Pattern/deviations: Step-through pattern, Trunk flexed Gait velocity: decr     General Gait  Details: Assistance for RW placement to keep cloesr to his body. Lunch arrived and he wanted to eat.   Stairs             Wheelchair Mobility     Tilt Bed    Modified Rankin (Stroke Patients Only)       Balance           Standing balance support: Reliant on assistive device for balance, Bilateral upper extremity supported, During functional activity Standing balance-Leahy Scale: Poor                              Cognition Arousal: Alert Behavior During Therapy: WFL for tasks assessed/performed Overall Cognitive Status: No family/caregiver present to determine baseline cognitive functioning                                 General Comments: HOH. At one point he stated he lived with his daddy, another time stated his son.  Reports his son is in his 76s.        Exercises      General Comments        Pertinent Vitals/Pain Pain Assessment Pain Assessment: Faces Faces Pain Scale: No hurt    Home Living                          Prior Function            PT Goals (current goals  can now be found in the care plan section) Acute Rehab PT Goals PT Goal Formulation: Patient unable to participate in goal setting Time For Goal Achievement: 12/26/22 Potential to Achieve Goals: Fair Progress towards PT goals: Progressing toward goals    Frequency    Min 1X/week      PT Plan      Co-evaluation              AM-PAC PT "6 Clicks" Mobility   Outcome Measure  Help needed turning from your back to your side while in a flat bed without using bedrails?: A Lot Help needed moving from lying on your back to sitting on the side of a flat bed without using bedrails?: A Lot Help needed moving to and from a bed to a chair (including a wheelchair)?: A Little Help needed standing up from a chair using your arms (e.g., wheelchair or bedside chair)?: A Little Help needed to walk in hospital room?: A Lot Help needed climbing  3-5 steps with a railing? : A Lot 6 Click Score: 14    End of Session Equipment Utilized During Treatment: Gait belt Activity Tolerance: Patient tolerated treatment well Patient left: in chair;with call bell/phone within reach;with chair alarm set;Other (comment) (eating lunch)   PT Visit Diagnosis: Muscle weakness (generalized) (M62.81);Difficulty in walking, not elsewhere classified (R26.2)     Time: 2952-8413 PT Time Calculation (min) (ACUTE ONLY): 12 min  Charges:    $Therapeutic Activity: 8-22 mins PT General Charges $$ ACUTE PT VISIT: 1 Visit                     John Broach., PT Office [phone] Acute Rehab 12/17/2022    John Rose 12/17/2022, 11:58 AM

## 2022-12-17 NOTE — Progress Notes (Signed)
Progress Note  Patient Name: John Rose Date of Encounter: 12/17/2022 Primary Cardiologist: Christell Constant, MD   Subjective   BNP improved but still notable elevated. He is concerned today that he has cancer.  Vital Signs    Vitals:   12/16/22 0600 12/16/22 1314 12/16/22 2122 12/17/22 0627  BP: 126/81 (!) 93/58 104/66 124/86  Pulse: 87 72 69 82  Resp: 17  18 18   Temp: 98.2 F (36.8 C) 97.6 F (36.4 C) 98.4 F (36.9 C) 98 F (36.7 C)  TempSrc: Oral Oral Oral Oral  SpO2: 94% 96% 98% 95%  Weight: 74.1 kg   69.5 kg  Height:        Intake/Output Summary (Last 24 hours) at 12/17/2022 0748 Last data filed at 12/17/2022 5284 Gross per 24 hour  Intake 600 ml  Output 3400 ml  Net -2800 ml   Filed Weights   12/15/22 0557 12/16/22 0600 12/17/22 1324  Weight: 71 kg 74.1 kg 69.5 kg    Physical Exam   Gen: No distress, elderly male  Cardiac: No Rubs or Gallops, no murmur Respiratory: Clear to auscultation bilaterally GI: Soft, nontender, non-distended  MS: no edema  moves all extremities Integument: Skin feels warm Neuro:  At time of evaluation, alert and oriented to person Psych: troubled affect, patient feels poorly  Labs   Telemetry: SR rare PVC   Chemistry Recent Labs  Lab 12/10/22 2047 12/11/22 0522 12/13/22 0515 12/14/22 1315 12/15/22 0503 12/16/22 0532 12/17/22 0457  NA 142   < > 140   < > 137 137 137  K 5.3*   < > 4.4   < > 4.0 4.0 4.0  CL 109   < > 105   < > 100 96* 97*  CO2 26   < > 25   < > 27 32 28  GLUCOSE 88   < > 93   < > 82 90 87  BUN 36*   < > 31*   < > 31* 33* 33*  CREATININE 1.73*   < > 1.65*   < > 1.46* 1.46* 1.28*  CALCIUM 9.2   < > 8.7*   < > 8.3* 8.6* 8.8*  PROT 7.1  --   --   --   --   --   --   ALBUMIN 3.9  --  3.0*  --   --   --   --   AST 37  --   --   --   --   --   --   ALT 35  --   --   --   --   --   --   ALKPHOS 79  --   --   --   --   --   --   BILITOT 0.6  --   --   --   --   --   --   GFRNONAA 35*   < >  37*   < > 43* 43* 51*  ANIONGAP 7   < > 10   < > 10 9 12    < > = values in this interval not displayed.     Hematology Recent Labs  Lab 12/11/22 0522 12/12/22 0836 12/13/22 0515  WBC 3.0* 2.7* 2.5*  RBC 4.33 3.68* 3.51*  HGB 13.9 11.9* 11.5*  HCT 44.9 38.7* 37.0*  MCV 103.7* 105.2* 105.4*  MCH 32.1 32.3 32.8  MCHC 31.0 30.7 31.1  RDW 14.1 14.0 13.9  PLT  80* 84* 83*    Cardiac EnzymesNo results for input(s): "TROPONINI" in the last 168 hours. No results for input(s): "TROPIPOC" in the last 168 hours.   BNP Recent Labs  Lab 12/10/22 2047 12/17/22 0457  BNP 2,688.8* 1,573.8*     DDimer No results for input(s): "DDIMER" in the last 168 hours.   Cardiac Studies   Cardiac Studies & Procedures       ECHOCARDIOGRAM  ECHOCARDIOGRAM COMPLETE 12/11/2022  Narrative ECHOCARDIOGRAM REPORT    Patient Name:   John Rose Date of Exam: 12/11/2022 Medical Rec #:  [id number]      Height:       68.0 in Accession #:    [id number]     Weight:       162.5 lb Date of Birth:  September 19, 1922       BSA:          1.871 m Patient Age:    89 years       BP:           149/101 mmHg Patient Gender: M              HR:           77 bpm. Exam Location:  Inpatient  Procedure: 2D Echo, Color Doppler and Cardiac Doppler  REPORT CONTAINS CRITICAL RESULT  Indications:    CHF  History:        Patient has prior history of Echocardiogram examinations, most recent 08/05/2021. CHF, CKD; Risk Factors:Dyslipidemia.  Sonographer:    Milbert Coulter Referring Phys: [id number] CAROLE N HALL  IMPRESSIONS   1. SVi 19 cc/m2. Left ventricular ejection fraction, by estimation, is 20 to 25%. The left ventricle has severely decreased function. The left ventricle demonstrates global hypokinesis. There is moderate left ventricular hypertrophy. Left ventricular diastolic parameters are consistent with Grade II diastolic dysfunction (pseudonormalization). 2. Right ventricular systolic function is mildly reduced. The  right ventricular size is mildly enlarged. There is moderately elevated pulmonary artery systolic pressure. The estimated right ventricular systolic pressure is 53.7 mmHg. 3. Left atrial size was moderately dilated. 4. Right atrial size was moderately dilated. 5. +Liver cyst. a small pericardial effusion is present. Large pleural effusion. 6. Mild mitral valve regurgitation. 7. Tricuspid valve regurgitation is severe. 8. There is moderate calcification of the aortic valve. Aortic valve regurgitation is mild. 9. The inferior vena cava is dilated in size with <50% respiratory variability, suggesting right atrial pressure of 15 mmHg.  Conclusion(s)/Recommendation(s): EF has significantly reduced compared to prior study.  FINDINGS Left Ventricle: SVi 19 cc/m2. Left ventricular ejection fraction, by estimation, is 20 to 25%. The left ventricle has severely decreased function. The left ventricle demonstrates global hypokinesis. The left ventricular internal cavity size was normal in size. There is moderate left ventricular hypertrophy. Left ventricular diastolic parameters are consistent with Grade II diastolic dysfunction (pseudonormalization).  Right Ventricle: The right ventricular size is mildly enlarged. Right ventricular systolic function is mildly reduced. There is moderately elevated pulmonary artery systolic pressure. The tricuspid regurgitant velocity is 3.11 m/s, and with an assumed right atrial pressure of 15 mmHg, the estimated right ventricular systolic pressure is 53.7 mmHg.  Left Atrium: Left atrial size was moderately dilated.  Right Atrium: Right atrial size was moderately dilated.  Pericardium: +Liver cyst. A small pericardial effusion is present.  Mitral Valve: Mild mitral valve regurgitation.  Tricuspid Valve: Tricuspid valve regurgitation is severe.  Aortic Valve: There is moderate calcification of the aortic valve. Aortic valve regurgitation  is mild. Aortic valve mean  gradient measures 1.0 mmHg. Aortic valve peak gradient measures 2.8 mmHg. Aortic valve area, by VTI measures 2.88 cm.  Pulmonic Valve: Pulmonic valve regurgitation is mild to moderate.  Venous: The inferior vena cava is dilated in size with less than 50% respiratory variability, suggesting right atrial pressure of 15 mmHg.  IAS/Shunts: No atrial level shunt detected by color flow Doppler.  Additional Comments: There is a large pleural effusion.   LEFT VENTRICLE PLAX 2D LVIDd:         3.80 cm   Diastology LVIDs:         3.60 cm   LV e' medial:    3.15 cm/s LV PW:         1.40 cm   LV E/e' medial:  21.1 LV IVS:        1.30 cm   LV e' lateral:   5.85 cm/s LVOT diam:     2.00 cm   LV E/e' lateral: 11.4 LV SV:         35 LV SV Index:   19 LVOT Area:     3.14 cm   RIGHT VENTRICLE RV Basal diam:  4.50 cm RV Mid diam:    3.50 cm RV S prime:     7.58 cm/s TAPSE (M-mode): 1.2 cm  LEFT ATRIUM             Index        RIGHT ATRIUM           Index LA diam:        4.40 cm 2.35 cm/m   RA Area:     24.30 cm LA Vol (A2C):   79.4 ml 42.44 ml/m  RA Volume:   76.50 ml  40.89 ml/m LA Vol (A4C):   61.9 ml 33.08 ml/m LA Biplane Vol: 74.9 ml 40.03 ml/m AORTIC VALVE AV Area (Vmax):    2.69 cm AV Area (Vmean):   2.56 cm AV Area (VTI):     2.88 cm AV Vmax:           84.40 cm/s AV Vmean:          55.300 cm/s AV VTI:            0.122 m AV Peak Grad:      2.8 mmHg AV Mean Grad:      1.0 mmHg LVOT Vmax:         72.40 cm/s LVOT Vmean:        45.000 cm/s LVOT VTI:          0.112 m LVOT/AV VTI ratio: 0.92  AORTA Ao Root diam: 3.60 cm  MITRAL VALVE               TRICUSPID VALVE MV Area (PHT): 4.21 cm    TR Peak grad:   38.7 mmHg MV Decel Time: 180 msec    TR Vmax:        311.00 cm/s MV E velocity: 66.40 cm/s MV A velocity: 48.00 cm/s  SHUNTS MV E/A ratio:  1.38        Systemic VTI:  0.11 m Systemic Diam: 2.00 cm  Carolan Clines Electronically signed by Carolan Clines Signature  Date/Time: 12/11/2022/3:03:24 PM    Final              Assessment & Plan    Heart Failure Reduced Ejection Fraction (combined systolic and diastolic) -  acute on chronic, with PVCs - he is 4 with  dementia, we will plan for conservative, PC consulted this admission  - NYHA class I, Stage D, hypervolemic, etiology unclear - BNP still notable elevated - 60 lasix BID; with diuretics his creatinine continues to improve; it may normalize; we are underestimating is response due to his dementia - Continue metoprolol succinate 25 mg PO daily, PVCs has nearly resolved on this dose - No ARNI/ARB/ACEi due to BP - no SGLT2i due to high risk of UTI - K has been relatively high, MRA deferred to outpatient - not a candidate for advanced or aggressive therapies due to dementia (see prior notes for family discussion)  For questions or updates, please contact [facility] Please consult [email or web address] for contact info under Cardiology/STEMI.      Riley Lam, MD FASE [facility] Cardiologist [facility]  [address] [place], [address] [place], [place] [address] [phone]  7:48 AM

## 2022-12-17 NOTE — Progress Notes (Signed)
[facility]                                                                              John Rose, is a 89 y.o. male, DOB - 08-11-1922, VVO:[id number] Admit date - 12/10/2022    Outpatient Primary MD for the patient is Patient, No Pcp Per  LOS - 7  days  Chief Complaint  Patient presents with   Cough       Brief summary   Patient is a 89 year old male with dementia, CKD 3B, HFpEF, CKD stage IIIb presented to ED with cough and shortness of breath. CT scan showed moderate bilateral pleural effusion with compressive atelectasis. Was mildly hypoxic.  Patient was admitted for CHF exacerbation, possible pneumonia  Assessment & Plan    Principal Problem:   Acute on chronic combined systolic and diastolic CHF (congestive heart failure) (HCC) -Prior echocardiogram in 2023 had shown EF of 60 to 65%, elevated BNP, moderate bilateral pleural effusions with shortness of breath, LE edema, mildly elevated troponins -2D echo showed EF of 25% with global hypokinesis, LVH, G2 DD, RV dysfunction -Cardiology consulted, plan for conservative therapy with age, dementia and other comorbidities -BNP still elevated 1573.8, continue IV Lasix 60 mg twice daily, -Continue beta-blocker, not a candidate for advanced or aggressive therapies due to dementia, no ACE/ARB/ARNI due to hypotension and renal insufficiency.   Active Problems: Elevated troponins -Likely due to demand ischemia due to #1, cardiology following  Acute kidney injury on stage 3b chronic kidney disease (HCC) -Baseline creatinine in 2023 around 1.4 -Creatinine stable 1.2  Concern for aspiration pneumonia -Ruled out, on room air, procalcitonin negative    Dementia (HCC) -Continue Seroquel     BPH (benign prostatic hyperplasia) -Continue Flomax   Estimated body mass index is 23.3 kg/m as calculated from the following:   Height as of this encounter: 5\' 8"  (1.727 m).   Weight as of this encounter: 69.5  kg.  Code Status: Full CODE STATUS DVT Prophylaxis:  Place and maintain sequential compression device Start: 12/12/22 1141   Level of Care: Level of care: Telemetry Family Communication: Updated patient.  Disposition Plan:      Remains inpatient appropriate: Currently on IV diuresis.  PT recommending SNF however patient from home with caregiver   Procedures:  2D echo  Consultants:   Cardiology  Antimicrobials:   Anti-infectives (From admission, onward)    Start     Dose/Rate Route Frequency Ordered Stop   12/11/22 0000  Ampicillin-Sulbactam (UNASYN) 3 g in sodium chloride 0.9 % 100 mL IVPB  Status:  Discontinued        3 g 200 mL/hr over 30 Minutes Intravenous Every 12 hours 12/10/22 2351 12/11/22 0810          Medications  furosemide  60 mg Intravenous BID   gabapentin  300 mg Oral BID   metoprolol succinate  25 mg Oral Daily   pneumococcal 20-valent conjugate vaccine  0.5 mL Intramuscular Tomorrow-1000   QUEtiapine  25 mg Oral QHS   tamsulosin  0.4 mg Oral Daily      Subjective:   John Rose was seen and examined  today.  Pleasant, eating breakfast, denies any specific complaints.    Objective:   Vitals:   12/16/22 2122 12/17/22 0627 12/17/22 0915 12/17/22 1243  BP: 104/66 124/86 123/89 99/66  Pulse: 69 82  75  Resp: 18 18 20 18   Temp: 98.4 F (36.9 C) 98 F (36.7 C) 98.9 F (37.2 C) 97.7 F (36.5 C)  TempSrc: Oral Oral Oral Oral  SpO2: 98% 95% 100% 97%  Weight:  69.5 kg    Height:        Intake/Output Summary (Last 24 hours) at 12/17/2022 1309 Last data filed at 12/17/2022 1054 Gross per 24 hour  Intake 840 ml  Output 4300 ml  Net -3460 ml     Wt Readings from Last 3 Encounters:  12/17/22 69.5 kg  09/07/22 73 kg  03/14/22 71.2 kg    Physical Exam General: Alert and oriented to self, NAD, dementia Cardiovascular: S1 S2 clear, RRR.  Respiratory: Diminished breath sound at the bases Gastrointestinal: Soft, nontender, nondistended,  NBS Ext: trace pedal edema bilaterally Neuro: no new deficits Psych: dementia, pleasant  Data Reviewed:  I have personally reviewed following labs    CBC Lab Results  Component Value Date   WBC 2.5 (L) 12/13/2022   RBC 3.51 (L) 12/13/2022   HGB 11.5 (L) 12/13/2022   HCT 37.0 (L) 12/13/2022   MCV 105.4 (H) 12/13/2022   MCH 32.8 12/13/2022   PLT 83 (L) 12/13/2022   MCHC 31.1 12/13/2022   RDW 13.9 12/13/2022   LYMPHSABS 0.9 12/12/2022   MONOABS 0.4 12/12/2022   EOSABS 0.1 12/12/2022   BASOSABS 0.0 12/12/2022     Last metabolic panel Lab Results  Component Value Date   NA 137 12/17/2022   K 4.0 12/17/2022   CL 97 (L) 12/17/2022   CO2 28 12/17/2022   BUN 33 (H) 12/17/2022   CREATININE 1.28 (H) 12/17/2022   GLUCOSE 87 12/17/2022   GFRNONAA 51 (L) 12/17/2022   CALCIUM 8.8 (L) 12/17/2022   PHOS 4.0 12/13/2022   PROT 7.1 12/10/2022   ALBUMIN 3.0 (L) 12/13/2022   BILITOT 0.6 12/10/2022   ALKPHOS 79 12/10/2022   AST 37 12/10/2022   ALT 35 12/10/2022   ANIONGAP 12 12/17/2022    CBG (last 3)  No results for input(s): "GLUCAP" in the last 72 hours.    Coagulation Profile: No results for input(s): "INR", "PROTIME" in the last 168 hours.   Radiology Studies: I have personally reviewed the imaging studies  No results found.     Thad Ranger M.D. [facility] 12/17/2022, 1:09 PM  Available via Epic secure chat 7am-7pm After 7 pm, please refer to night coverage provider listed on amion.

## 2022-12-17 NOTE — Plan of Care (Signed)

## 2022-12-18 DIAGNOSIS — I5043 Acute on chronic combined systolic (congestive) and diastolic (congestive) heart failure: Secondary | ICD-10-CM | POA: Diagnosis not present

## 2022-12-18 DIAGNOSIS — N400 Enlarged prostate without lower urinary tract symptoms: Secondary | ICD-10-CM | POA: Diagnosis not present

## 2022-12-18 DIAGNOSIS — F0150 Vascular dementia without behavioral disturbance: Secondary | ICD-10-CM | POA: Diagnosis not present

## 2022-12-18 DIAGNOSIS — E782 Mixed hyperlipidemia: Secondary | ICD-10-CM | POA: Diagnosis not present

## 2022-12-18 NOTE — Plan of Care (Signed)

## 2022-12-18 NOTE — [place] FL2 (Signed)
[place] MEDICAID FL2 LEVEL OF CARE FORM     IDENTIFICATION  Patient Name: John Rose Birthdate: [birth year] Sex: male Admission Date (Current Location): 12/10/2022  [facility] [place] [facility] [place] and [place] Number:  Producer, television/film/video and Address:  [facility],  [address], [place] [address]      Provider Number: [id number]  Attending Physician Name and Address:  Cathren Harsh, MD  Relative Name and Phone Number:  Elijaah Osmun [phone]    Current Level of Care: Hospital Recommended Level of Care: Skilled Nursing Facility Prior Approval Number:    Date Approved/Denied:   PASRR Number: [id number]  Discharge Plan: SNF    Current Diagnoses: Patient Active Problem List   Diagnosis Date Noted   BPH (benign prostatic hyperplasia) 12/13/2022   Acute on chronic combined systolic and diastolic CHF (congestive heart failure) (HCC) 12/13/2022   Acute on chronic diastolic (congestive) heart failure (HCC) 12/10/2022   Mixed hyperlipidemia 09/07/2022   Stage 3b chronic kidney disease (HCC) 09/07/2022   PAC (premature atrial contraction) 09/07/2022   Dementia (HCC) 09/07/2022   Bilateral lower extremity edema 12/14/2021   Urinary tract infection without hematuria 12/14/2021   Acute on chronic heart failure with preserved ejection fraction (HCC) 12/14/2021   Rhabdomyolysis 08/04/2021   Aortic atherosclerosis (HCC) 08/04/2021   Pain of left hip joint 04/13/2021   Lumbar spondylosis 11/10/2016    Orientation RESPIRATION BLADDER Height & Weight     Self, Place  Normal Incontinent Weight: 70.4 kg Height:  5\' 8"  (172.7 cm)  BEHAVIORAL SYMPTOMS/MOOD NEUROLOGICAL BOWEL NUTRITION STATUS      Incontinent Diet (see d/c summary)  AMBULATORY STATUS COMMUNICATION OF NEEDS Skin   Limited Assist Verbally                         Personal Care Assistance Level of Assistance  Bathing, Feeding, Dressing Bathing Assistance: Limited assistance Feeding assistance: Limited  assistance Dressing Assistance: Limited assistance     Functional Limitations Info  Sight, Hearing, Speech Sight Info: Impaired (eyeglasses) Hearing Info: Adequate Speech Info: Impaired (dentures top/bottom)    SPECIAL CARE FACTORS FREQUENCY  PT (By licensed PT), OT (By licensed OT)     PT Frequency: 5x week OT Frequency: 5x week            Contractures Contractures Info: Not present    Additional Factors Info  Code Status, Allergies Code Status Info: DNR limited Allergies Info: NKA           Current Medications (12/18/2022):  This is the current hospital active medication list Current Facility-Administered Medications  Medication Dose Route Frequency Provider Last Rate Last Admin   acetaminophen (TYLENOL) tablet 650 mg  650 mg Oral Q6H PRN Rolly Salter, MD   650 mg at 12/12/22 2149   Or   acetaminophen (TYLENOL) suppository 650 mg  650 mg Rectal Q6H PRN Rolly Salter, MD       furosemide (LASIX) injection 60 mg  60 mg Intravenous BID Chandrasekhar, Mahesh A, MD   60 mg at 12/18/22 0859   gabapentin (NEURONTIN) capsule 300 mg  300 mg Oral BID Rolly Salter, MD   300 mg at 12/18/22 0859   metoprolol succinate (TOPROL-XL) 24 hr tablet 25 mg  25 mg Oral Daily Chandrasekhar, Mahesh A, MD   25 mg at 12/18/22 0859   ondansetron (ZOFRAN) tablet 4 mg  4 mg Oral Q6H PRN Rolly Salter, MD  Or   ondansetron (ZOFRAN) injection 4 mg  4 mg Intravenous Q6H PRN Rolly Salter, MD       pneumococcal 20-valent conjugate vaccine (PREVNAR 20) injection 0.5 mL  0.5 mL Intramuscular Tomorrow-1000 Hall, Carole N, DO       QUEtiapine (SEROQUEL) tablet 25 mg  25 mg Oral QHS Rolly Salter, MD   25 mg at 12/17/22 2209   senna-docusate (Senokot-S) tablet 1 tablet  1 tablet Oral QHS PRN Rolly Salter, MD       tamsulosin [facility]) capsule 0.4 mg  0.4 mg Oral Daily Dow Adolph N, DO   0.4 mg at 12/18/22 6712     Discharge Medications: Please see discharge summary for a list of  discharge medications.  Relevant Imaging Results:  Relevant Lab Results:   Additional Information SS# [id number]  Lanier Clam, RN

## 2022-12-18 NOTE — Progress Notes (Signed)
   Patient Name: John Rose Date of Encounter: 12/18/2022 [facility] Cardiologist: Christell Constant, MD   Interval Summary  .    Patient resting comfortably in bed this morning. Denies focal complaints.   Vital Signs .    Vitals:   12/17/22 1705 12/17/22 2032 12/18/22 0532 12/18/22 0600  BP: 101/77 117/71 116/73   Pulse:  86 78   Resp: 20 17 17    Temp:  98.1 F (36.7 C) 97.6 F (36.4 C)   TempSrc:  Oral Oral   SpO2:  96% 98%   Weight:    70.4 kg  Height:        Intake/Output Summary (Last 24 hours) at 12/18/2022 0805 Last data filed at 12/17/2022 2200 Gross per 24 hour  Intake 840 ml  Output 2250 ml  Net -1410 ml      12/18/2022    6:00 AM 12/17/2022    6:27 AM 12/16/2022    6:00 AM  Last 3 Weights  Weight (lbs) 155 lb 3.3 oz 153 lb 3.5 oz 163 lb 5.8 oz  Weight (kg) 70.4 kg 69.5 kg 74.1 kg      Telemetry/ECG    Sinus rhythm - Personally Reviewed  Physical Exam .   GEN: No acute distress.   Neck: JVD midway to mandible Cardiac: RRR, no murmurs, rubs, or gallops.  Respiratory: Clear to auscultation bilaterally. GI: Soft, nontender, non-distended  MS: No edema  Assessment & Plan .     Acute on chronic systolic and diastolic heart failure  Patient admitted with symptoms concerning for acute heart failure exacerbation.  TTE this admission found LVEF less than 20% with biventricular dysfunction and large pleural effusion.  Prior to this study, patient's LVEF normal.  Patient now net -11.7 L with improved but still remarkably elevated BNP of 1573 (down from 2688).  Creatinine also shows continued improvement. Continue IV Lasix 60 mg twice daily with stable renal function Unable to utilize ARNI/ARB/ACEi low blood pressure Hold MRA given high normal potassium despite significant daily diuresis with Lasix Given relatively high risk of urinary tract infection, will defer initiation of SGLT2  Frequent PVCs  Patient noted to have frequent PVCs on  telemetry this admission.  Is now on metoprolol succinate 25 mg with significant improvement.  Continue with this dose/regimen.  For questions or updates, please contact [facility] Please consult [email or web address] for contact info under        Signed, Perlie Gold, PA-C

## 2022-12-18 NOTE — TOC Progression Note (Signed)
Transition of Care [facility]) - Progression Note    Patient Details  Name: John Rose MRN: [id number] Date of Birth: May 19, 1922  [facility] of [facility]) CM/SW Contact  Rachael Zapanta, [name], RN Phone Number: 12/18/2022, 3:49 PM  Clinical Narrative: Faxed out prefers [facility] await bed offers.      Expected Discharge Plan: Skilled Nursing Facility Barriers to Discharge: Continued Medical Work up  Expected Discharge Plan and Services                                               Social Determinants of Health (SDOH) Interventions SDOH Screenings   Food Insecurity: No Food Insecurity (12/11/2022)  Housing: Low Risk  (12/11/2022)  Transportation Needs: No Transportation Needs (12/11/2022)  Utilities: Not At Risk (12/11/2022)  Tobacco Use: Low Risk  (12/11/2022)    Readmission Risk Interventions     No data to display

## 2022-12-18 NOTE — Progress Notes (Addendum)
[facility]                                                                              [name], is a 89 y.o. male, DOB - 01-08-1923, VFI:[id number] Admit date - 12/10/2022    Outpatient Primary MD for the John Rose is John Rose, No Pcp Per  LOS - 8  days  Chief Complaint  John Rose presents with   Cough       Brief summary   John Rose is a 89 year old male with dementia, CKD 3B, HFpEF, CKD stage IIIb presented to ED with cough and shortness of breath. CT scan showed moderate bilateral pleural effusion with compressive atelectasis. Was mildly hypoxic.  John Rose was admitted for CHF exacerbation, possible pneumonia  Assessment & Plan    Principal Problem:   Acute on chronic combined systolic and diastolic CHF (congestive heart failure) (HCC) -Prior echo in 2023: EF of 60 to 65% presented with elevated BNP, moderate bilateral pleural effusions with shortness of breath, LE edema, mildly elevated troponins -2D echo showed EF of 25% with global hypokinesis, LVH, G2 DD, RV dysfunction -Cardiology consulted, plan for conservative therapy with age, dementia and other comorbidities -BNP on 9/15 still elevated 1573.8 -Continue beta-blocker, not a candidate for advanced or aggressive therapies due to dementia, no ACE/ARB/ARNI due to hypotension and renal insufficiency. -Continue Lasix 60 mg IV twice daily   Active Problems: Elevated troponins -Likely due to demand ischemia due to #1, cardiology following  Acute kidney injury on stage 3b chronic kidney disease (HCC) -Baseline creatinine in 2023 around 1.4 -Creatinine stable 1.2, follow BMET   Concern for aspiration pneumonia -Ruled out, on room air, procalcitonin negative    Dementia (HCC) -Continue Seroquel     BPH (benign prostatic hyperplasia) -Continue Flomax   Estimated body mass index is 23.6 kg/m as calculated from the following:   Height as of this encounter: 5\' 8"  (1.727 m).   Weight as of this  encounter: 70.4 kg.  Code Status: Full CODE STATUS DVT Prophylaxis:  Place and maintain sequential compression device Start: 12/12/22 1141   Level of Care: Level of care: Telemetry Family Communication: Updated John Rose.  Disposition Plan:      Remains inpatient appropriate: Currently on IV diuresis.  PT recommending SNF however John Rose from home with caregiver   Procedures:  2D echo  Consultants:   Cardiology  Antimicrobials:   Anti-infectives (From admission, onward)    Start     Dose/Rate Route Frequency Ordered Stop   12/11/22 0000  Ampicillin-Sulbactam (UNASYN) 3 g in sodium chloride 0.9 % 100 mL IVPB  Status:  Discontinued        3 g 200 mL/hr over 30 Minutes Intravenous Every 12 hours 12/10/22 2351 12/11/22 0810          Medications  furosemide  60 mg Intravenous BID   gabapentin  300 mg Oral BID   metoprolol succinate  25 mg Oral Daily   pneumococcal 20-valent conjugate vaccine  0.5 mL Intramuscular Tomorrow-1000   QUEtiapine  25 mg Oral QHS   tamsulosin  0.4 mg Oral Daily      Subjective:   John  Rose was seen and examined today.  No acute complaints.  Seen earlier in the morning, resting comfortably, no acute shortness of breath.    Objective:   Vitals:   12/18/22 0532 12/18/22 0600 12/18/22 0858 12/18/22 1226  BP: 116/73  113/73 (!) 94/54  Pulse: 78  82 75  Resp: 17  18 16   Temp: 97.6 F (36.4 C)   98.9 F (37.2 C)  TempSrc: Oral   Oral  SpO2: 98%  98% 93%  Weight:  70.4 kg    Height:        Intake/Output Summary (Last 24 hours) at 12/18/2022 1245 Last data filed at 12/18/2022 1011 Gross per 24 hour  Intake 720 ml  Output 1600 ml  Net -880 ml     Wt Readings from Last 3 Encounters:  12/18/22 70.4 kg  09/07/22 73 kg  03/14/22 71.2 kg   Physical Exam General: Alert and oriented, NAD, pleasant Cardiovascular: S1 S2 clear, RRR.  JVD+ Respiratory: Diminished BS  Ext: trace pedal edema bilaterally Neuro: no new deficits Skin: No  rashes Psych: dementia, pleasant Data Reviewed:  I have personally reviewed following labs    CBC Lab Results  Component Value Date   WBC 2.5 (L) 12/13/2022   RBC 3.51 (L) 12/13/2022   HGB 11.5 (L) 12/13/2022   HCT 37.0 (L) 12/13/2022   MCV 105.4 (H) 12/13/2022   MCH 32.8 12/13/2022   PLT 83 (L) 12/13/2022   MCHC 31.1 12/13/2022   RDW 13.9 12/13/2022   LYMPHSABS 0.9 12/12/2022   MONOABS 0.4 12/12/2022   EOSABS 0.1 12/12/2022   BASOSABS 0.0 12/12/2022     Last metabolic panel Lab Results  Component Value Date   NA 137 12/17/2022   K 4.0 12/17/2022   CL 97 (L) 12/17/2022   CO2 28 12/17/2022   BUN 33 (H) 12/17/2022   CREATININE 1.28 (H) 12/17/2022   GLUCOSE 87 12/17/2022   GFRNONAA 51 (L) 12/17/2022   CALCIUM 8.8 (L) 12/17/2022   PHOS 4.0 12/13/2022   PROT 7.1 12/10/2022   ALBUMIN 3.0 (L) 12/13/2022   BILITOT 0.6 12/10/2022   ALKPHOS 79 12/10/2022   AST 37 12/10/2022   ALT 35 12/10/2022   ANIONGAP 12 12/17/2022    CBG (last 3)  No results for input(s): "GLUCAP" in the last 72 hours.    Coagulation Profile: No results for input(s): "INR", "PROTIME" in the last 168 hours.   Radiology Studies: I have personally reviewed the imaging studies  No results found.     Thad Ranger M.D. [facility] 12/18/2022, 12:45 PM  Available via Epic secure chat 7am-7pm After 7 pm, please refer to night coverage provider listed on amion.

## 2022-12-18 NOTE — Care Management Important Message (Signed)
Important Message  Patient Details IM Letter placed in room. Name: John Rose MRN: [id number] Date of Birth: Mar 18, 1923   Medicare Important Message Given:  Yes     Caren Macadam 12/18/2022, 1:47 PM

## 2022-12-18 NOTE — Progress Notes (Signed)
Physical Therapy Treatment Patient Details Name: John Rose MRN: [id number] DOB: [birth year] Today's Date: 12/18/2022   History of Present Illness 89 y.o. male admitted 12/10/22 for productive cough, SOB. Imaging found Cardiomegaly with pulmonary vascular congestion and moderate bilateral pleural effusions, right greater than left. Associated bibasilar opacities, which may represent atelectasis or infection. PMH includes CKD 3B, chronic diastolic CHF, cognitive impairment    PT Comments  The patient is alert and participatory. The patient requires mod support for balance upon standing and during turns using RW. Patient ambulated x 150' with RW, trunk flexed.  Patient remained in recliner after ambulation. Patient will benefit from continued inpatient follow up therapy, <3 hours/day    If plan is discharge home, recommend the following: A lot of help with walking and/or transfers;A lot of help with bathing/dressing/bathroom;Assist for transportation;Help with stairs or ramp for entrance;Supervision due to cognitive status   Can travel by private vehicle     No  Equipment Recommendations  Rolling walker (2 wheels)    Recommendations for Other Services       Precautions / Restrictions Precautions Precautions: Fall Precaution Comments: pleasant,   Can have variable performance and assistance level from +1 to +2 Restrictions Weight Bearing Restrictions: No     Mobility  Bed Mobility               General bed mobility comments: up in recliner upon arrival.    Transfers Overall transfer level: Needs assistance Equipment used: Rolling walker (2 wheels) Transfers: Sit to/from Stand Sit to Stand: Mod assist           General transfer comment: sit to stand multiple reps from chair with multi-modal cues for hand placement and mod support to power up and stabilize    Ambulation/Gait Ambulation/Gait assistance: Min assist Gait Distance (Feet): 150 Feet Assistive device:  Rolling walker (2 wheels) Gait Pattern/deviations: Step-through pattern, Trunk flexed, Staggering right, Staggering left Gait velocity: decr     General Gait Details: Assistance for RW placement to keep cloesr to his body.  Steady assistance for turning   Stairs             Wheelchair Mobility     Tilt Bed    Modified Rankin (Stroke Patients Only)       Balance Overall balance assessment: Needs assistance Sitting-balance support: Bilateral upper extremity supported, Feet supported Sitting balance-Leahy Scale: Fair Sitting balance - Comments: sits  in recliner Postural control: Posterior lean Standing balance support: Reliant on assistive device for balance, Bilateral upper extremity supported, During functional activity Standing balance-Leahy Scale: Poor Standing balance comment: dependent on external source                            Cognition Arousal: Alert Behavior During Therapy: WFL for tasks assessed/performed Overall Cognitive Status: No family/caregiver present to determine baseline cognitive functioning                                 General Comments: HOH. Follows directions        Exercises      General Comments        Pertinent Vitals/Pain Pain Assessment Faces Pain Scale: Hurts a little bit Pain Location: right hand Pain Descriptors / Indicators: Discomfort, Grimacing, Tightness    Home Living  Prior Function            PT Goals (current goals can now be found in the care plan section) Progress towards PT goals: Progressing toward goals    Frequency    Min 1X/week      PT Plan      Co-evaluation              AM-PAC PT "6 Clicks" Mobility   Outcome Measure  Help needed turning from your back to your side while in a flat bed without using bedrails?: A Lot Help needed moving from lying on your back to sitting on the side of a flat bed without using  bedrails?: A Lot Help needed moving to and from a bed to a chair (including a wheelchair)?: A Lot Help needed standing up from a chair using your arms (e.g., wheelchair or bedside chair)?: A Lot Help needed to walk in hospital room?: Total Help needed climbing 3-5 steps with a railing? : A Lot 6 Click Score: 11    End of Session Equipment Utilized During Treatment: Gait belt Activity Tolerance: Patient tolerated treatment well Patient left: in chair;with call bell/phone within reach;with chair alarm set Nurse Communication: Mobility status PT Visit Diagnosis: Muscle weakness (generalized) (M62.81);Difficulty in walking, not elsewhere classified (R26.2)     Time: 1400-1420 PT Time Calculation (min) (ACUTE ONLY): 20 min  Charges:    $Gait Training: 8-22 mins PT General Charges $$ ACUTE PT VISIT: 1 Visit                     Blanchard Kelch PT Acute Rehabilitation Services Office [phone] Weekend pager-[phone]    Rada Hay 12/18/2022, 2:29 PM

## 2022-12-19 ENCOUNTER — Telehealth: Payer: Self-pay | Admitting: Cardiology

## 2022-12-19 DIAGNOSIS — I5022 Chronic systolic (congestive) heart failure: Secondary | ICD-10-CM | POA: Diagnosis not present

## 2022-12-19 DIAGNOSIS — D696 Thrombocytopenia, unspecified: Secondary | ICD-10-CM | POA: Diagnosis not present

## 2022-12-19 DIAGNOSIS — F0390 Unspecified dementia without behavioral disturbance: Secondary | ICD-10-CM | POA: Diagnosis not present

## 2022-12-19 DIAGNOSIS — R531 Weakness: Secondary | ICD-10-CM | POA: Diagnosis not present

## 2022-12-19 DIAGNOSIS — N400 Enlarged prostate without lower urinary tract symptoms: Secondary | ICD-10-CM | POA: Diagnosis not present

## 2022-12-19 DIAGNOSIS — L84 Corns and callosities: Secondary | ICD-10-CM | POA: Diagnosis not present

## 2022-12-19 DIAGNOSIS — R1312 Dysphagia, oropharyngeal phase: Secondary | ICD-10-CM | POA: Diagnosis not present

## 2022-12-19 DIAGNOSIS — E538 Deficiency of other specified B group vitamins: Secondary | ICD-10-CM | POA: Diagnosis not present

## 2022-12-19 DIAGNOSIS — I10 Essential (primary) hypertension: Secondary | ICD-10-CM | POA: Diagnosis not present

## 2022-12-19 DIAGNOSIS — N401 Enlarged prostate with lower urinary tract symptoms: Secondary | ICD-10-CM | POA: Diagnosis not present

## 2022-12-19 DIAGNOSIS — D72819 Decreased white blood cell count, unspecified: Secondary | ICD-10-CM | POA: Diagnosis not present

## 2022-12-19 DIAGNOSIS — Z7401 Bed confinement status: Secondary | ICD-10-CM | POA: Diagnosis not present

## 2022-12-19 DIAGNOSIS — I517 Cardiomegaly: Secondary | ICD-10-CM | POA: Diagnosis not present

## 2022-12-19 DIAGNOSIS — N1832 Chronic kidney disease, stage 3b: Secondary | ICD-10-CM | POA: Diagnosis not present

## 2022-12-19 DIAGNOSIS — I5043 Acute on chronic combined systolic (congestive) and diastolic (congestive) heart failure: Secondary | ICD-10-CM | POA: Diagnosis not present

## 2022-12-19 DIAGNOSIS — F03C2 Unspecified dementia, severe, with psychotic disturbance: Secondary | ICD-10-CM | POA: Diagnosis not present

## 2022-12-19 DIAGNOSIS — I700 Atherosclerosis of aorta: Secondary | ICD-10-CM | POA: Diagnosis not present

## 2022-12-19 DIAGNOSIS — D638 Anemia in other chronic diseases classified elsewhere: Secondary | ICD-10-CM | POA: Diagnosis not present

## 2022-12-19 DIAGNOSIS — M47816 Spondylosis without myelopathy or radiculopathy, lumbar region: Secondary | ICD-10-CM | POA: Diagnosis not present

## 2022-12-19 DIAGNOSIS — F411 Generalized anxiety disorder: Secondary | ICD-10-CM | POA: Diagnosis not present

## 2022-12-19 DIAGNOSIS — B353 Tinea pedis: Secondary | ICD-10-CM | POA: Diagnosis not present

## 2022-12-19 DIAGNOSIS — L89152 Pressure ulcer of sacral region, stage 2: Secondary | ICD-10-CM | POA: Diagnosis not present

## 2022-12-19 DIAGNOSIS — Z23 Encounter for immunization: Secondary | ICD-10-CM | POA: Diagnosis not present

## 2022-12-19 DIAGNOSIS — J90 Pleural effusion, not elsewhere classified: Secondary | ICD-10-CM | POA: Diagnosis not present

## 2022-12-19 DIAGNOSIS — J9601 Acute respiratory failure with hypoxia: Secondary | ICD-10-CM | POA: Diagnosis not present

## 2022-12-19 DIAGNOSIS — R54 Age-related physical debility: Secondary | ICD-10-CM | POA: Diagnosis not present

## 2022-12-19 DIAGNOSIS — I358 Other nonrheumatic aortic valve disorders: Secondary | ICD-10-CM | POA: Diagnosis not present

## 2022-12-19 DIAGNOSIS — N179 Acute kidney failure, unspecified: Secondary | ICD-10-CM | POA: Diagnosis not present

## 2022-12-19 DIAGNOSIS — M6281 Muscle weakness (generalized): Secondary | ICD-10-CM | POA: Diagnosis not present

## 2022-12-19 DIAGNOSIS — M159 Polyosteoarthritis, unspecified: Secondary | ICD-10-CM | POA: Diagnosis not present

## 2022-12-19 DIAGNOSIS — E291 Testicular hypofunction: Secondary | ICD-10-CM | POA: Diagnosis not present

## 2022-12-19 DIAGNOSIS — R2681 Unsteadiness on feet: Secondary | ICD-10-CM | POA: Diagnosis not present

## 2022-12-19 DIAGNOSIS — L988 Other specified disorders of the skin and subcutaneous tissue: Secondary | ICD-10-CM | POA: Diagnosis not present

## 2022-12-19 DIAGNOSIS — F0150 Vascular dementia without behavioral disturbance: Secondary | ICD-10-CM | POA: Diagnosis not present

## 2022-12-19 DIAGNOSIS — Z66 Do not resuscitate: Secondary | ICD-10-CM | POA: Diagnosis not present

## 2022-12-19 DIAGNOSIS — E782 Mixed hyperlipidemia: Secondary | ICD-10-CM | POA: Diagnosis not present

## 2022-12-19 LAB — BASIC METABOLIC PANEL
Anion gap: 9 (ref 5–15)
BUN: 46 mg/dL — ABNORMAL HIGH (ref 8–23)
CO2: 33 mmol/L — ABNORMAL HIGH (ref 22–32)
Calcium: 8.6 mg/dL — ABNORMAL LOW (ref 8.9–10.3)
Chloride: 94 mmol/L — ABNORMAL LOW (ref 98–111)
Creatinine, Ser: 1.54 mg/dL — ABNORMAL HIGH (ref 0.61–1.24)
GFR, Estimated: 41 mL/min — ABNORMAL LOW (ref >60)
Glucose, Bld: 98 mg/dL (ref 70–99)
Potassium: 3.5 mmol/L (ref 3.5–5.1)
Sodium: 136 mmol/L (ref 135–145)

## 2022-12-19 LAB — BRAIN NATRIURETIC PEPTIDE: B Natriuretic Peptide: 984.1 pg/mL — ABNORMAL HIGH (ref 0.0–100.0)

## 2022-12-19 MED ORDER — FUROSEMIDE 80 MG PO TABS: 80.0000 mg | ORAL_TABLET | Freq: Every day | ORAL | Status: DC

## 2022-12-19 MED ORDER — FUROSEMIDE 40 MG PO TABS: 80.0000 mg | ORAL_TABLET | Freq: Every day | ORAL | Status: DC

## 2022-12-19 MED ORDER — METOPROLOL SUCCINATE ER 25 MG PO TB24: 25.0000 mg | ORAL_TABLET | Freq: Every day | ORAL | Status: DC

## 2022-12-19 MED ORDER — POTASSIUM CHLORIDE CRYS ER 20 MEQ PO TBCR: 20.0000 meq | EXTENDED_RELEASE_TABLET | Freq: Every day | ORAL | Status: DC

## 2022-12-19 MED ORDER — FUROSEMIDE 80 MG PO TABS: 80.0000 mg | ORAL_TABLET | Freq: Every day | ORAL | 3 refills | Status: DC

## 2022-12-19 MED ORDER — SENNOSIDES-DOCUSATE SODIUM 8.6-50 MG PO TABS: 1.0000 | ORAL_TABLET | Freq: Every evening | ORAL | Status: DC | PRN

## 2022-12-19 MED ORDER — QUETIAPINE FUMARATE 25 MG PO TABS: 25.0000 mg | ORAL_TABLET | Freq: Every day | ORAL | Status: DC

## 2022-12-19 NOTE — Progress Notes (Signed)
Patient Name: John Rose Date of Encounter: 12/19/2022 [facility] Cardiologist: Christell Constant, MD   Interval Summary  .    Patient has no complaints.  He is resting comfortably and eating his breakfast.  Denies any shortness of breath.  Vital Signs .    Vitals:   12/18/22 1226 12/18/22 1813 12/18/22 2112 12/19/22 0524  BP: (!) 94/54 107/72 (!) 97/52 105/64  Pulse: 75  84 72  Resp: 16  18 16   Temp: 98.9 F (37.2 C)  98.7 F (37.1 C) 98.5 F (36.9 C)  TempSrc: Oral  Oral Oral  SpO2: 93%  95% 97%  Weight:      Height:        Intake/Output Summary (Last 24 hours) at 12/19/2022 0929 Last data filed at 12/19/2022 0514 Gross per 24 hour  Intake 480 ml  Output 1500 ml  Net -1020 ml      12/18/2022    6:00 AM 12/17/2022    6:27 AM 12/16/2022    6:00 AM  Last 3 Weights  Weight (lbs) 155 lb 3.3 oz 153 lb 3.5 oz 163 lb 5.8 oz  Weight (kg) 70.4 kg 69.5 kg 74.1 kg      Telemetry/ECG    Normal sinus rhythm.  Infrequent PVCs- Personally Reviewed  CV Studies    Echocardiogram 12/11/2022  1. SVi 19 cc/m2. Left ventricular ejection fraction, by estimation, is 20  to 25%. The left ventricle has severely decreased function. The left  ventricle demonstrates global hypokinesis. There is moderate left  ventricular hypertrophy. Left ventricular  diastolic parameters are consistent with Grade II diastolic dysfunction  (pseudonormalization).   2. Right ventricular systolic function is mildly reduced. The right  ventricular size is mildly enlarged. There is moderately elevated  pulmonary artery systolic pressure. The estimated right ventricular  systolic pressure is 53.7 mmHg.   3. Left atrial size was moderately dilated.   4. Right atrial size was moderately dilated.   5. +Liver cyst. a small pericardial effusion is present. Large pleural  effusion.   6. Mild mitral valve regurgitation.   7. Tricuspid valve regurgitation is severe.   8. There is moderate  calcification of the aortic valve. Aortic valve  regurgitation is mild.   9. The inferior vena cava is dilated in size with <50% respiratory  variability, suggesting right atrial pressure of 15 mmHg.   Conclusion(s)/Recommendation(s): EF has significantly reduced compared to  prior study.    Physical Exam .   GEN: No acute distress.   Neck: + JVD Cardiac: RRR, no murmurs, rubs, or gallops.  Respiratory: Clear to auscultation bilaterally.  Poor respiratory effort GI: Soft, nontender, non-distended  MS: No edema  Patient Profile    John Rose is a 89 y.o. male has hx of chronic HFpEF, PACs, dementia, CKD IIIb  and admitted on 12/10/2022 for the evaluation of newly reduced LVEF.  Assessment & Plan .     Acute on chronic combined HFrEF Newly reduced EF. Echocardiogram this admission shows newly reduced EF 20-25%.  He had moderate LVH.  Grade 2 diastolic dysfunction.  Mildly reduced RV function.  RVSP 53.7.  Mild MR, severe TR.  Has been diuresed on IV Lasix 60 mg twice daily.  Appears overall euvolemic with no complaints of shortness of breath.  Renal function now showing a decline 1.54-1.28.  Appears to be sufficiently diuresed.  His JVD may be more attributed to his TR rather than volume. Stopping his IV Lasix.  Will discuss  with MD about transitioning to p.o. diuretics.  He is diuresed 13 L this admission.  1.7 in the last hour, slowing. With new onset HFrEF would normally consider ischemic evaluation however given advanced age/dementia family appears to have opted for conservative measures. GDMT limited by hypotension.  No SGLT2 inhibitor due to high risk for UTI.  Initiation of MRI to be done outpatient.  PVCs These were noted to be frequent on prior notes however he seems to be rarely occurring for the past several days.  Seems to have resolved on Toprol-XL 25 mg.  Continue.  Monitor for low cardiac output state.  Disposition Awaiting rehab facility  For questions or  updates, please contact  [facility] Please consult [email or web address] for contact info under        Signed, Abagail Kitchens, PA-C

## 2022-12-19 NOTE — Telephone Encounter (Signed)
Spoke with patient's son per DPR, and he states patient will not be able to make a follow up appointment he needs to be transported by ambulance. How would you like to proceed?

## 2022-12-19 NOTE — Telephone Encounter (Signed)
Hi,   Can you call patient and schedule 2 week follow up? He was a DC today that I was not notified about.

## 2022-12-19 NOTE — Progress Notes (Signed)
Mobility Specialist - Progress Note   12/19/22 1319  Mobility  Activity Ambulated with assistance to bathroom  Level of Assistance Moderate assist, patient does 50-74%  Assistive Device Front wheel walker  Distance Ambulated (ft) 20 ft  Range of Motion/Exercises Active  Activity Response Tolerated well  Mobility Referral Yes  $Mobility charge 1 Mobility  Mobility Specialist Start Time (ACUTE ONLY) 1300  Mobility Specialist Stop Time (ACUTE ONLY) 1319  Mobility Specialist Time Calculation (min) (ACUTE ONLY) 19 min   Pt was found on recliner chair and agreeable to ambulate. Was mod-A for STS and once standing stated needing to use bathroom. Was assisted to bathroom and afterwards stated wanting to go back to bed. At EOS was left in bed with all needs met. Call bell in reach and bed alarm on.  Billey Chang Mobility Specialist

## 2022-12-19 NOTE — Progress Notes (Signed)
Mobility Specialist - Progress Note   12/19/22 0850  Mobility  Activity Ambulated with assistance to bathroom  Level of Assistance Moderate assist, patient does 50-74%  Assistive Device Front wheel walker  Distance Ambulated (ft) 20 ft  Range of Motion/Exercises Active  Activity Response Tolerated well  Mobility Referral Yes  $Mobility charge 1 Mobility  Mobility Specialist Start Time (ACUTE ONLY) 0830  Mobility Specialist Stop Time (ACUTE ONLY) 0850  Mobility Specialist Time Calculation (min) (ACUTE ONLY) 20 min   Pt was found in bed and agreeable to transfer to recliner chair. Was mod-A for bed mobility and STS. Once standing stated wanting to use bathroom. Had x1 LOB when turning to go towards bathroom that needed assistance to correct. At EOS was left on recliner chair with all needs met. Call bell in reach and chair alarm on. RN notified.  Billey Chang Mobility Specialist

## 2022-12-19 NOTE — Discharge Summary (Addendum)
Physician Discharge Summary   Patient: John Rose MRN: [id number] DOB: 1923-01-25  Admit date:     12/10/2022  Discharge date: 12/19/22  Discharge Physician: Thad Ranger, MD    PCP: Patient, No Pcp Per   Recommendations at discharge:   Lasix increased to 80 mg daily Consider adding MRA as an outpatient  Discharge Diagnoses:    Acute on chronic combined systolic and diastolic CHF (congestive heart failure) (HCC)   Mixed hyperlipidemia   Stage 3b chronic kidney disease (HCC)   Dementia (HCC)   BPH (benign prostatic hyperplasia) Mild acute respiratory failure with hypoxia    Hospital Course:  Patient is a 89 year old male with dementia, CKD 3B, HFpEF, CKD stage IIIb presented to ED with cough and shortness of breath. CT scan showed moderate bilateral pleural effusion with compressive atelectasis. Was mildly hypoxic.  Patient was admitted for CHF exacerbation.  Assessment and Plan:   Acute on chronic combined systolic and diastolic CHF (congestive heart failure) (HCC) -Mild acute respiratory failure with hypoxia, resolved In ED, noted to be mildly hypoxic with O2 sats in 80s likely due to acute on chronic CHF -Prior echo in 2023: EF of 60 to 65% presented with elevated BNP, moderate bilateral pleural effusions with shortness of breath, LE edema, mildly elevated troponins -2D echo showed EF of 25% with global hypokinesis, LVH, G2 DD, RV dysfunction -Cardiology consulted, plan for conservative therapy with age, dementia and other comorbidities -Continue beta-blocker, not a candidate for advanced or aggressive therapies due to dementia, no ACE/ARB/ARNI due to hypotension and renal insufficiency. -Patient was placed on Lasix 60 mg IV twice daily, negative balance of 12.6 L, weight 162.4 lbs on admission--> 155 at discharge -Transitioned to Lasix 80 mg PO daily, currently O2 sats 96 to 100% on room air   Elevated troponins -Likely due to demand ischemia due to #1, cardiology  following   Acute kidney injury on stage 3b chronic kidney disease (HCC) -Baseline creatinine in 2023 around 1.4 -Creatinine 1.5 at discharge, IV Lasix transitioned to p.o.   Concern for aspiration pneumonia -Ruled out, on room air, procalcitonin negative     Dementia (HCC) -Continue Seroquel       BPH (benign prostatic hyperplasia) -Continue Flomax     Estimated body mass index is 23.6 kg/m as calculated from the following:   Height as of this encounter: 5\' 8"  (1.727 m).   Weight as of this encounter: 70.4       Pain control - [place] Controlled Substance Reporting System database was reviewed. and patient was instructed, not to drive, operate heavy machinery, perform activities at heights, swimming or participation in water activities or provide baby-sitting services while on Pain, Sleep and Anxiety Medications; until their outpatient Physician has advised to do so again. Also recommended to not to take more than prescribed Pain, Sleep and Anxiety Medications.  Consultants: Cardiology Procedures performed: 2D echo Disposition: Skilled nursing facility Diet recommendation:  Discharge Diet Orders (From admission, onward)     Start     Ordered   12/19/22 0000  Diet - low sodium heart healthy        12/19/22 1309            DISCHARGE MEDICATION: Allergies as of 12/19/2022   No Known Allergies      Medication List     STOP taking these medications    colchicine 0.6 MG tablet   potassium chloride SA 20 MEQ tablet Commonly known as: Klor-Con M20  Physician Discharge Summary   Patient: John Rose MRN: [id number] DOB: 1923-01-25  Admit date:     12/10/2022  Discharge date: 12/19/22  Discharge Physician: Thad Ranger, MD    PCP: Patient, No Pcp Per   Recommendations at discharge:   Lasix increased to 80 mg daily Consider adding MRA as an outpatient  Discharge Diagnoses:    Acute on chronic combined systolic and diastolic CHF (congestive heart failure) (HCC)   Mixed hyperlipidemia   Stage 3b chronic kidney disease (HCC)   Dementia (HCC)   BPH (benign prostatic hyperplasia) Mild acute respiratory failure with hypoxia    Hospital Course:  Patient is a 89 year old male with dementia, CKD 3B, HFpEF, CKD stage IIIb presented to ED with cough and shortness of breath. CT scan showed moderate bilateral pleural effusion with compressive atelectasis. Was mildly hypoxic.  Patient was admitted for CHF exacerbation.  Assessment and Plan:   Acute on chronic combined systolic and diastolic CHF (congestive heart failure) (HCC) -Mild acute respiratory failure with hypoxia, resolved In ED, noted to be mildly hypoxic with O2 sats in 80s likely due to acute on chronic CHF -Prior echo in 2023: EF of 60 to 65% presented with elevated BNP, moderate bilateral pleural effusions with shortness of breath, LE edema, mildly elevated troponins -2D echo showed EF of 25% with global hypokinesis, LVH, G2 DD, RV dysfunction -Cardiology consulted, plan for conservative therapy with age, dementia and other comorbidities -Continue beta-blocker, not a candidate for advanced or aggressive therapies due to dementia, no ACE/ARB/ARNI due to hypotension and renal insufficiency. -Patient was placed on Lasix 60 mg IV twice daily, negative balance of 12.6 L, weight 162.4 lbs on admission--> 155 at discharge -Transitioned to Lasix 80 mg PO daily, currently O2 sats 96 to 100% on room air   Elevated troponins -Likely due to demand ischemia due to #1, cardiology  following   Acute kidney injury on stage 3b chronic kidney disease (HCC) -Baseline creatinine in 2023 around 1.4 -Creatinine 1.5 at discharge, IV Lasix transitioned to p.o.   Concern for aspiration pneumonia -Ruled out, on room air, procalcitonin negative     Dementia (HCC) -Continue Seroquel       BPH (benign prostatic hyperplasia) -Continue Flomax     Estimated body mass index is 23.6 kg/m as calculated from the following:   Height as of this encounter: 5\' 8"  (1.727 m).   Weight as of this encounter: 70.4       Pain control - [place] Controlled Substance Reporting System database was reviewed. and patient was instructed, not to drive, operate heavy machinery, perform activities at heights, swimming or participation in water activities or provide baby-sitting services while on Pain, Sleep and Anxiety Medications; until their outpatient Physician has advised to do so again. Also recommended to not to take more than prescribed Pain, Sleep and Anxiety Medications.  Consultants: Cardiology Procedures performed: 2D echo Disposition: Skilled nursing facility Diet recommendation:  Discharge Diet Orders (From admission, onward)     Start     Ordered   12/19/22 0000  Diet - low sodium heart healthy        12/19/22 1309            DISCHARGE MEDICATION: Allergies as of 12/19/2022   No Known Allergies      Medication List     STOP taking these medications    colchicine 0.6 MG tablet   potassium chloride SA 20 MEQ tablet Commonly known as: Klor-Con M20  Physician Discharge Summary   Patient: John Rose MRN: [id number] DOB: 1923-01-25  Admit date:     12/10/2022  Discharge date: 12/19/22  Discharge Physician: Thad Ranger, MD    PCP: Patient, No Pcp Per   Recommendations at discharge:   Lasix increased to 80 mg daily Consider adding MRA as an outpatient  Discharge Diagnoses:    Acute on chronic combined systolic and diastolic CHF (congestive heart failure) (HCC)   Mixed hyperlipidemia   Stage 3b chronic kidney disease (HCC)   Dementia (HCC)   BPH (benign prostatic hyperplasia) Mild acute respiratory failure with hypoxia    Hospital Course:  Patient is a 89 year old male with dementia, CKD 3B, HFpEF, CKD stage IIIb presented to ED with cough and shortness of breath. CT scan showed moderate bilateral pleural effusion with compressive atelectasis. Was mildly hypoxic.  Patient was admitted for CHF exacerbation.  Assessment and Plan:   Acute on chronic combined systolic and diastolic CHF (congestive heart failure) (HCC) -Mild acute respiratory failure with hypoxia, resolved In ED, noted to be mildly hypoxic with O2 sats in 80s likely due to acute on chronic CHF -Prior echo in 2023: EF of 60 to 65% presented with elevated BNP, moderate bilateral pleural effusions with shortness of breath, LE edema, mildly elevated troponins -2D echo showed EF of 25% with global hypokinesis, LVH, G2 DD, RV dysfunction -Cardiology consulted, plan for conservative therapy with age, dementia and other comorbidities -Continue beta-blocker, not a candidate for advanced or aggressive therapies due to dementia, no ACE/ARB/ARNI due to hypotension and renal insufficiency. -Patient was placed on Lasix 60 mg IV twice daily, negative balance of 12.6 L, weight 162.4 lbs on admission--> 155 at discharge -Transitioned to Lasix 80 mg PO daily, currently O2 sats 96 to 100% on room air   Elevated troponins -Likely due to demand ischemia due to #1, cardiology  following   Acute kidney injury on stage 3b chronic kidney disease (HCC) -Baseline creatinine in 2023 around 1.4 -Creatinine 1.5 at discharge, IV Lasix transitioned to p.o.   Concern for aspiration pneumonia -Ruled out, on room air, procalcitonin negative     Dementia (HCC) -Continue Seroquel       BPH (benign prostatic hyperplasia) -Continue Flomax     Estimated body mass index is 23.6 kg/m as calculated from the following:   Height as of this encounter: 5\' 8"  (1.727 m).   Weight as of this encounter: 70.4       Pain control - [place] Controlled Substance Reporting System database was reviewed. and patient was instructed, not to drive, operate heavy machinery, perform activities at heights, swimming or participation in water activities or provide baby-sitting services while on Pain, Sleep and Anxiety Medications; until their outpatient Physician has advised to do so again. Also recommended to not to take more than prescribed Pain, Sleep and Anxiety Medications.  Consultants: Cardiology Procedures performed: 2D echo Disposition: Skilled nursing facility Diet recommendation:  Discharge Diet Orders (From admission, onward)     Start     Ordered   12/19/22 0000  Diet - low sodium heart healthy        12/19/22 1309            DISCHARGE MEDICATION: Allergies as of 12/19/2022   No Known Allergies      Medication List     STOP taking these medications    colchicine 0.6 MG tablet   potassium chloride SA 20 MEQ tablet Commonly known as: Klor-Con M20  Physician Discharge Summary   Patient: John Rose MRN: [id number] DOB: 1923-01-25  Admit date:     12/10/2022  Discharge date: 12/19/22  Discharge Physician: Thad Ranger, MD    PCP: Patient, No Pcp Per   Recommendations at discharge:   Lasix increased to 80 mg daily Consider adding MRA as an outpatient  Discharge Diagnoses:    Acute on chronic combined systolic and diastolic CHF (congestive heart failure) (HCC)   Mixed hyperlipidemia   Stage 3b chronic kidney disease (HCC)   Dementia (HCC)   BPH (benign prostatic hyperplasia) Mild acute respiratory failure with hypoxia    Hospital Course:  Patient is a 89 year old male with dementia, CKD 3B, HFpEF, CKD stage IIIb presented to ED with cough and shortness of breath. CT scan showed moderate bilateral pleural effusion with compressive atelectasis. Was mildly hypoxic.  Patient was admitted for CHF exacerbation.  Assessment and Plan:   Acute on chronic combined systolic and diastolic CHF (congestive heart failure) (HCC) -Mild acute respiratory failure with hypoxia, resolved In ED, noted to be mildly hypoxic with O2 sats in 80s likely due to acute on chronic CHF -Prior echo in 2023: EF of 60 to 65% presented with elevated BNP, moderate bilateral pleural effusions with shortness of breath, LE edema, mildly elevated troponins -2D echo showed EF of 25% with global hypokinesis, LVH, G2 DD, RV dysfunction -Cardiology consulted, plan for conservative therapy with age, dementia and other comorbidities -Continue beta-blocker, not a candidate for advanced or aggressive therapies due to dementia, no ACE/ARB/ARNI due to hypotension and renal insufficiency. -Patient was placed on Lasix 60 mg IV twice daily, negative balance of 12.6 L, weight 162.4 lbs on admission--> 155 at discharge -Transitioned to Lasix 80 mg PO daily, currently O2 sats 96 to 100% on room air   Elevated troponins -Likely due to demand ischemia due to #1, cardiology  following   Acute kidney injury on stage 3b chronic kidney disease (HCC) -Baseline creatinine in 2023 around 1.4 -Creatinine 1.5 at discharge, IV Lasix transitioned to p.o.   Concern for aspiration pneumonia -Ruled out, on room air, procalcitonin negative     Dementia (HCC) -Continue Seroquel       BPH (benign prostatic hyperplasia) -Continue Flomax     Estimated body mass index is 23.6 kg/m as calculated from the following:   Height as of this encounter: 5\' 8"  (1.727 m).   Weight as of this encounter: 70.4       Pain control - [place] Controlled Substance Reporting System database was reviewed. and patient was instructed, not to drive, operate heavy machinery, perform activities at heights, swimming or participation in water activities or provide baby-sitting services while on Pain, Sleep and Anxiety Medications; until their outpatient Physician has advised to do so again. Also recommended to not to take more than prescribed Pain, Sleep and Anxiety Medications.  Consultants: Cardiology Procedures performed: 2D echo Disposition: Skilled nursing facility Diet recommendation:  Discharge Diet Orders (From admission, onward)     Start     Ordered   12/19/22 0000  Diet - low sodium heart healthy        12/19/22 1309            DISCHARGE MEDICATION: Allergies as of 12/19/2022   No Known Allergies      Medication List     STOP taking these medications    colchicine 0.6 MG tablet   potassium chloride SA 20 MEQ tablet Commonly known as: Klor-Con M20  Physician Discharge Summary   Patient: John Rose MRN: [id number] DOB: 1923-01-25  Admit date:     12/10/2022  Discharge date: 12/19/22  Discharge Physician: Thad Ranger, MD    PCP: Patient, No Pcp Per   Recommendations at discharge:   Lasix increased to 80 mg daily Consider adding MRA as an outpatient  Discharge Diagnoses:    Acute on chronic combined systolic and diastolic CHF (congestive heart failure) (HCC)   Mixed hyperlipidemia   Stage 3b chronic kidney disease (HCC)   Dementia (HCC)   BPH (benign prostatic hyperplasia) Mild acute respiratory failure with hypoxia    Hospital Course:  Patient is a 89 year old male with dementia, CKD 3B, HFpEF, CKD stage IIIb presented to ED with cough and shortness of breath. CT scan showed moderate bilateral pleural effusion with compressive atelectasis. Was mildly hypoxic.  Patient was admitted for CHF exacerbation.  Assessment and Plan:   Acute on chronic combined systolic and diastolic CHF (congestive heart failure) (HCC) -Mild acute respiratory failure with hypoxia, resolved In ED, noted to be mildly hypoxic with O2 sats in 80s likely due to acute on chronic CHF -Prior echo in 2023: EF of 60 to 65% presented with elevated BNP, moderate bilateral pleural effusions with shortness of breath, LE edema, mildly elevated troponins -2D echo showed EF of 25% with global hypokinesis, LVH, G2 DD, RV dysfunction -Cardiology consulted, plan for conservative therapy with age, dementia and other comorbidities -Continue beta-blocker, not a candidate for advanced or aggressive therapies due to dementia, no ACE/ARB/ARNI due to hypotension and renal insufficiency. -Patient was placed on Lasix 60 mg IV twice daily, negative balance of 12.6 L, weight 162.4 lbs on admission--> 155 at discharge -Transitioned to Lasix 80 mg PO daily, currently O2 sats 96 to 100% on room air   Elevated troponins -Likely due to demand ischemia due to #1, cardiology  following   Acute kidney injury on stage 3b chronic kidney disease (HCC) -Baseline creatinine in 2023 around 1.4 -Creatinine 1.5 at discharge, IV Lasix transitioned to p.o.   Concern for aspiration pneumonia -Ruled out, on room air, procalcitonin negative     Dementia (HCC) -Continue Seroquel       BPH (benign prostatic hyperplasia) -Continue Flomax     Estimated body mass index is 23.6 kg/m as calculated from the following:   Height as of this encounter: 5\' 8"  (1.727 m).   Weight as of this encounter: 70.4       Pain control - [place] Controlled Substance Reporting System database was reviewed. and patient was instructed, not to drive, operate heavy machinery, perform activities at heights, swimming or participation in water activities or provide baby-sitting services while on Pain, Sleep and Anxiety Medications; until their outpatient Physician has advised to do so again. Also recommended to not to take more than prescribed Pain, Sleep and Anxiety Medications.  Consultants: Cardiology Procedures performed: 2D echo Disposition: Skilled nursing facility Diet recommendation:  Discharge Diet Orders (From admission, onward)     Start     Ordered   12/19/22 0000  Diet - low sodium heart healthy        12/19/22 1309            DISCHARGE MEDICATION: Allergies as of 12/19/2022   No Known Allergies      Medication List     STOP taking these medications    colchicine 0.6 MG tablet   potassium chloride SA 20 MEQ tablet Commonly known as: Klor-Con M20  effusions. EXAM: CHEST - 2 VIEW COMPARISON:  X-ray 12/10/2022 and older FINDINGS: Moderate bilateral pleural effusions with the adjacent opacities. There is some mild patchy opacity right midlung. No pneumothorax. Enlarged cardiopericardial silhouette with vascular congestion. No pneumothorax. Kyphotic x-ray obscures the apices. Overlapping cardiac leads. Degenerative changes of the spine. IMPRESSION: Moderate pleural effusions with adjacent opacities. The opacities are slightly increased from previous. Enlarged heart with some central vascular congestion. Electronically Signed   By: Karen Kays M.D.   On: 12/12/2022 19:22   ECHOCARDIOGRAM COMPLETE  Result Date: 12/11/2022    ECHOCARDIOGRAM REPORT   Patient Name:   LEONTA IGARASHI Date of Exam: 12/11/2022 Medical Rec #:  [id number]      Height:       68.0 in Accession #:    [id number]     Weight:       162.5 lb Date of Birth:  09-28-1922       BSA:          1.871 m Patient Age:    89 years       BP:           149/101 mmHg Patient Gender: M              HR:            77 bpm. Exam Location:  Inpatient Procedure: 2D Echo, Color Doppler and Cardiac Doppler REPORT CONTAINS CRITICAL RESULT Indications:    CHF  History:        Patient has prior history of Echocardiogram examinations, most                 recent 08/05/2021. CHF, CKD; Risk Factors:Dyslipidemia.  Sonographer:    Milbert Coulter Referring Phys: [id number] CAROLE N HALL IMPRESSIONS  1. SVi 19 cc/m2. Left ventricular ejection fraction, by estimation, is 20 to 25%. The left ventricle has severely decreased function. The left ventricle demonstrates global hypokinesis. There is moderate left ventricular hypertrophy. Left ventricular diastolic parameters are consistent with Grade II diastolic dysfunction (pseudonormalization).  2. Right ventricular systolic function is mildly reduced. The right ventricular size is mildly enlarged. There is moderately elevated pulmonary artery systolic pressure. The estimated right ventricular systolic pressure is 53.7 mmHg.  3. Left atrial size was moderately dilated.  4. Right atrial size was moderately dilated.  5. +Liver cyst. a small pericardial effusion is present. Large pleural effusion.  6. Mild mitral valve regurgitation.  7. Tricuspid valve regurgitation is severe.  8. There is moderate calcification of the aortic valve. Aortic valve regurgitation is mild.  9. The inferior vena cava is dilated in size with <50% respiratory variability, suggesting right atrial pressure of 15 mmHg. Conclusion(s)/Recommendation(s): EF has significantly reduced compared to prior study. FINDINGS  Left Ventricle: SVi 19 cc/m2. Left ventricular ejection fraction, by estimation, is 20 to 25%. The left ventricle has severely decreased function. The left ventricle demonstrates global hypokinesis. The left ventricular internal cavity size was normal in size. There is moderate left ventricular hypertrophy. Left ventricular diastolic parameters are consistent with Grade II diastolic dysfunction (pseudonormalization). Right  Ventricle: The right ventricular size is mildly enlarged. Right ventricular systolic function is mildly reduced. There is moderately elevated pulmonary artery systolic pressure. The tricuspid regurgitant velocity is 3.11 m/s, and with an assumed right atrial pressure of 15 mmHg, the estimated right ventricular systolic pressure is 53.7 mmHg. Left Atrium: Left atrial size was moderately dilated. Right Atrium: Right atrial size was moderately dilated. Pericardium: +Liver cyst. A small pericardial  Physician Discharge Summary   Patient: John Rose MRN: [id number] DOB: 1923-01-25  Admit date:     12/10/2022  Discharge date: 12/19/22  Discharge Physician: Thad Ranger, MD    PCP: Patient, No Pcp Per   Recommendations at discharge:   Lasix increased to 80 mg daily Consider adding MRA as an outpatient  Discharge Diagnoses:    Acute on chronic combined systolic and diastolic CHF (congestive heart failure) (HCC)   Mixed hyperlipidemia   Stage 3b chronic kidney disease (HCC)   Dementia (HCC)   BPH (benign prostatic hyperplasia) Mild acute respiratory failure with hypoxia    Hospital Course:  Patient is a 89 year old male with dementia, CKD 3B, HFpEF, CKD stage IIIb presented to ED with cough and shortness of breath. CT scan showed moderate bilateral pleural effusion with compressive atelectasis. Was mildly hypoxic.  Patient was admitted for CHF exacerbation.  Assessment and Plan:   Acute on chronic combined systolic and diastolic CHF (congestive heart failure) (HCC) -Mild acute respiratory failure with hypoxia, resolved In ED, noted to be mildly hypoxic with O2 sats in 80s likely due to acute on chronic CHF -Prior echo in 2023: EF of 60 to 65% presented with elevated BNP, moderate bilateral pleural effusions with shortness of breath, LE edema, mildly elevated troponins -2D echo showed EF of 25% with global hypokinesis, LVH, G2 DD, RV dysfunction -Cardiology consulted, plan for conservative therapy with age, dementia and other comorbidities -Continue beta-blocker, not a candidate for advanced or aggressive therapies due to dementia, no ACE/ARB/ARNI due to hypotension and renal insufficiency. -Patient was placed on Lasix 60 mg IV twice daily, negative balance of 12.6 L, weight 162.4 lbs on admission--> 155 at discharge -Transitioned to Lasix 80 mg PO daily, currently O2 sats 96 to 100% on room air   Elevated troponins -Likely due to demand ischemia due to #1, cardiology  following   Acute kidney injury on stage 3b chronic kidney disease (HCC) -Baseline creatinine in 2023 around 1.4 -Creatinine 1.5 at discharge, IV Lasix transitioned to p.o.   Concern for aspiration pneumonia -Ruled out, on room air, procalcitonin negative     Dementia (HCC) -Continue Seroquel       BPH (benign prostatic hyperplasia) -Continue Flomax     Estimated body mass index is 23.6 kg/m as calculated from the following:   Height as of this encounter: 5\' 8"  (1.727 m).   Weight as of this encounter: 70.4       Pain control - [place] Controlled Substance Reporting System database was reviewed. and patient was instructed, not to drive, operate heavy machinery, perform activities at heights, swimming or participation in water activities or provide baby-sitting services while on Pain, Sleep and Anxiety Medications; until their outpatient Physician has advised to do so again. Also recommended to not to take more than prescribed Pain, Sleep and Anxiety Medications.  Consultants: Cardiology Procedures performed: 2D echo Disposition: Skilled nursing facility Diet recommendation:  Discharge Diet Orders (From admission, onward)     Start     Ordered   12/19/22 0000  Diet - low sodium heart healthy        12/19/22 1309            DISCHARGE MEDICATION: Allergies as of 12/19/2022   No Known Allergies      Medication List     STOP taking these medications    colchicine 0.6 MG tablet   potassium chloride SA 20 MEQ tablet Commonly known as: Klor-Con M20

## 2022-12-19 NOTE — TOC Progression Note (Addendum)
Transition of Care [facility]) - Progression Note    Patient Details  Name: John Rose MRN: [id number] Date of Birth: [birth year]  Transition of Care [facility]) CM/SW Contact  Wealthy Danielski, [name], RN Phone Number: 12/19/2022, 11:42 AM  Clinical Narrative:  [name] chose [facility] rep Tracey aware,accepted,has bed available. Awaiting medical stability. MD notified. -1:43p-going to [facility] rep Tracey aware rm#[id number],report tel#[phone]. PTAR called. No further CM needs.     Expected Discharge Plan: Skilled Nursing Facility Barriers to Discharge: Continued Medical Work up  Expected Discharge Plan and Services                                               Social Determinants of Health (SDOH) Interventions SDOH Screenings   Food Insecurity: No Food Insecurity (12/11/2022)  Housing: Low Risk  (12/11/2022)  Transportation Needs: No Transportation Needs (12/11/2022)  Utilities: Not At Risk (12/11/2022)  Tobacco Use: Low Risk  (12/11/2022)    Readmission Risk Interventions     No data to display

## 2022-12-20 DIAGNOSIS — Z66 Do not resuscitate: Secondary | ICD-10-CM | POA: Diagnosis not present

## 2022-12-20 DIAGNOSIS — I358 Other nonrheumatic aortic valve disorders: Secondary | ICD-10-CM | POA: Diagnosis not present

## 2022-12-20 DIAGNOSIS — N1832 Chronic kidney disease, stage 3b: Secondary | ICD-10-CM | POA: Diagnosis not present

## 2022-12-20 DIAGNOSIS — I517 Cardiomegaly: Secondary | ICD-10-CM | POA: Diagnosis not present

## 2022-12-20 DIAGNOSIS — I5043 Acute on chronic combined systolic (congestive) and diastolic (congestive) heart failure: Secondary | ICD-10-CM | POA: Diagnosis not present

## 2022-12-20 DIAGNOSIS — F0390 Unspecified dementia without behavioral disturbance: Secondary | ICD-10-CM | POA: Diagnosis not present

## 2022-12-20 DIAGNOSIS — I700 Atherosclerosis of aorta: Secondary | ICD-10-CM | POA: Diagnosis not present

## 2022-12-20 DIAGNOSIS — N401 Enlarged prostate with lower urinary tract symptoms: Secondary | ICD-10-CM | POA: Diagnosis not present

## 2022-12-22 ENCOUNTER — Other Ambulatory Visit: Payer: Self-pay | Admitting: *Deleted

## 2022-12-22 NOTE — Patient Outreach (Signed)
Per [facility] Ms. Scharpf resides in [facility] skilled nursing facility. Screening for potential care coordination/chronic care management services as benefit of health plan and Primary Care Provider. Appears active with [facility].   Secure communication sent to Jill Side, [employer], for collaboration about transition plans and potential care coordination/chronic care management needs.   Will continue to follow.   Raiford Noble, MSN, RN, BSN [place]  [facility], [facility] RN Post- Acute Care Coordinator Direct Dial: [phone]

## 2022-12-22 NOTE — Telephone Encounter (Signed)
Left voicemail to return call to office.

## 2023-01-01 DIAGNOSIS — F03C2 Unspecified dementia, severe, with psychotic disturbance: Secondary | ICD-10-CM | POA: Diagnosis not present

## 2023-01-03 ENCOUNTER — Other Ambulatory Visit: Payer: Self-pay | Admitting: *Deleted

## 2023-01-03 DIAGNOSIS — L988 Other specified disorders of the skin and subcutaneous tissue: Secondary | ICD-10-CM | POA: Diagnosis not present

## 2023-01-03 NOTE — Patient Outreach (Signed)
Post- Acute Care Coordinator follow up. Mr. Nazaire resides in [facility].  Screening for potential care coordination/ chronic care management services as a benefit of health plan and primary care provider.   Appears [facility] is PCP. Message left for [facility], Lawson Fiscal, to confirm.   Collaboration with Jill Side, Programmer, systems. Mr. Harland' son is looking into Mr. Juanetta Gosling' LTC policies to see if he has coverage for caregiver assistance at home or possible LTC in facility. Jill Side states she is still waiting to hear back from son.  Will continue to follow.  Raiford Noble, MSN, RN, BSN [place]  [facility], [facility] Commun[facility] RN Post- Acute Care Coordinator Direct Dial: [phone]

## 2023-01-10 DIAGNOSIS — L988 Other specified disorders of the skin and subcutaneous tissue: Secondary | ICD-10-CM | POA: Diagnosis not present

## 2023-01-11 DIAGNOSIS — R54 Age-related physical debility: Secondary | ICD-10-CM | POA: Diagnosis not present

## 2023-01-11 DIAGNOSIS — M159 Polyosteoarthritis, unspecified: Secondary | ICD-10-CM | POA: Diagnosis not present

## 2023-01-11 DIAGNOSIS — F411 Generalized anxiety disorder: Secondary | ICD-10-CM | POA: Diagnosis not present

## 2023-01-11 DIAGNOSIS — L89152 Pressure ulcer of sacral region, stage 2: Secondary | ICD-10-CM | POA: Diagnosis not present

## 2023-01-11 DIAGNOSIS — M47816 Spondylosis without myelopathy or radiculopathy, lumbar region: Secondary | ICD-10-CM | POA: Diagnosis not present

## 2023-01-11 DIAGNOSIS — D638 Anemia in other chronic diseases classified elsewhere: Secondary | ICD-10-CM | POA: Diagnosis not present

## 2023-01-11 DIAGNOSIS — I5022 Chronic systolic (congestive) heart failure: Secondary | ICD-10-CM | POA: Diagnosis not present

## 2023-01-11 DIAGNOSIS — N400 Enlarged prostate without lower urinary tract symptoms: Secondary | ICD-10-CM | POA: Diagnosis not present

## 2023-01-11 DIAGNOSIS — I10 Essential (primary) hypertension: Secondary | ICD-10-CM | POA: Diagnosis not present

## 2023-01-15 DIAGNOSIS — D696 Thrombocytopenia, unspecified: Secondary | ICD-10-CM | POA: Diagnosis not present

## 2023-01-15 DIAGNOSIS — E042 Nontoxic multinodular goiter: Secondary | ICD-10-CM | POA: Diagnosis not present

## 2023-01-15 DIAGNOSIS — M1A9XX0 Chronic gout, unspecified, without tophus (tophi): Secondary | ICD-10-CM | POA: Diagnosis not present

## 2023-01-15 DIAGNOSIS — M25542 Pain in joints of left hand: Secondary | ICD-10-CM | POA: Diagnosis not present

## 2023-01-15 DIAGNOSIS — I700 Atherosclerosis of aorta: Secondary | ICD-10-CM | POA: Diagnosis not present

## 2023-01-15 DIAGNOSIS — I350 Nonrheumatic aortic (valve) stenosis: Secondary | ICD-10-CM | POA: Diagnosis not present

## 2023-01-15 DIAGNOSIS — Z993 Dependence on wheelchair: Secondary | ICD-10-CM | POA: Diagnosis not present

## 2023-01-15 DIAGNOSIS — M47816 Spondylosis without myelopathy or radiculopathy, lumbar region: Secondary | ICD-10-CM | POA: Diagnosis not present

## 2023-01-15 DIAGNOSIS — I5022 Chronic systolic (congestive) heart failure: Secondary | ICD-10-CM | POA: Diagnosis not present

## 2023-01-15 DIAGNOSIS — N400 Enlarged prostate without lower urinary tract symptoms: Secondary | ICD-10-CM | POA: Diagnosis not present

## 2023-01-15 DIAGNOSIS — R1312 Dysphagia, oropharyngeal phase: Secondary | ICD-10-CM | POA: Diagnosis not present

## 2023-01-15 DIAGNOSIS — R451 Restlessness and agitation: Secondary | ICD-10-CM | POA: Diagnosis not present

## 2023-01-15 DIAGNOSIS — M25541 Pain in joints of right hand: Secondary | ICD-10-CM | POA: Diagnosis not present

## 2023-01-15 DIAGNOSIS — I872 Venous insufficiency (chronic) (peripheral): Secondary | ICD-10-CM | POA: Diagnosis not present

## 2023-01-15 DIAGNOSIS — F0390 Unspecified dementia without behavioral disturbance: Secondary | ICD-10-CM | POA: Diagnosis not present

## 2023-01-15 DIAGNOSIS — N1832 Chronic kidney disease, stage 3b: Secondary | ICD-10-CM | POA: Diagnosis not present

## 2023-01-15 DIAGNOSIS — Z79899 Other long term (current) drug therapy: Secondary | ICD-10-CM | POA: Diagnosis not present

## 2023-01-15 DIAGNOSIS — M5459 Other low back pain: Secondary | ICD-10-CM | POA: Diagnosis not present

## 2023-01-15 DIAGNOSIS — B353 Tinea pedis: Secondary | ICD-10-CM | POA: Diagnosis not present

## 2023-01-15 DIAGNOSIS — D519 Vitamin B12 deficiency anemia, unspecified: Secondary | ICD-10-CM | POA: Diagnosis not present

## 2023-01-15 DIAGNOSIS — Z8673 Personal history of transient ischemic attack (TIA), and cerebral infarction without residual deficits: Secondary | ICD-10-CM | POA: Diagnosis not present

## 2023-01-15 DIAGNOSIS — I5043 Acute on chronic combined systolic (congestive) and diastolic (congestive) heart failure: Secondary | ICD-10-CM | POA: Diagnosis not present

## 2023-01-15 DIAGNOSIS — R634 Abnormal weight loss: Secondary | ICD-10-CM | POA: Diagnosis not present

## 2023-01-15 DIAGNOSIS — Z9181 History of falling: Secondary | ICD-10-CM | POA: Diagnosis not present

## 2023-01-15 DIAGNOSIS — I110 Hypertensive heart disease with heart failure: Secondary | ICD-10-CM | POA: Diagnosis not present

## 2023-01-15 DIAGNOSIS — N179 Acute kidney failure, unspecified: Secondary | ICD-10-CM | POA: Diagnosis not present

## 2023-01-15 DIAGNOSIS — I130 Hypertensive heart and chronic kidney disease with heart failure and stage 1 through stage 4 chronic kidney disease, or unspecified chronic kidney disease: Secondary | ICD-10-CM | POA: Diagnosis not present

## 2023-01-15 DIAGNOSIS — M6389 Disorders of muscle in diseases classified elsewhere, multiple sites: Secondary | ICD-10-CM | POA: Diagnosis not present

## 2023-01-18 DIAGNOSIS — D519 Vitamin B12 deficiency anemia, unspecified: Secondary | ICD-10-CM | POA: Diagnosis not present

## 2023-01-18 DIAGNOSIS — N179 Acute kidney failure, unspecified: Secondary | ICD-10-CM | POA: Diagnosis not present

## 2023-01-18 DIAGNOSIS — I130 Hypertensive heart and chronic kidney disease with heart failure and stage 1 through stage 4 chronic kidney disease, or unspecified chronic kidney disease: Secondary | ICD-10-CM | POA: Diagnosis not present

## 2023-01-18 DIAGNOSIS — F0390 Unspecified dementia without behavioral disturbance: Secondary | ICD-10-CM | POA: Diagnosis not present

## 2023-01-18 DIAGNOSIS — N1832 Chronic kidney disease, stage 3b: Secondary | ICD-10-CM | POA: Diagnosis not present

## 2023-01-18 DIAGNOSIS — I5043 Acute on chronic combined systolic (congestive) and diastolic (congestive) heart failure: Secondary | ICD-10-CM | POA: Diagnosis not present

## 2023-01-22 DIAGNOSIS — F0390 Unspecified dementia without behavioral disturbance: Secondary | ICD-10-CM | POA: Diagnosis not present

## 2023-01-22 DIAGNOSIS — N179 Acute kidney failure, unspecified: Secondary | ICD-10-CM | POA: Diagnosis not present

## 2023-01-22 DIAGNOSIS — D519 Vitamin B12 deficiency anemia, unspecified: Secondary | ICD-10-CM | POA: Diagnosis not present

## 2023-01-22 DIAGNOSIS — I5043 Acute on chronic combined systolic (congestive) and diastolic (congestive) heart failure: Secondary | ICD-10-CM | POA: Diagnosis not present

## 2023-01-22 DIAGNOSIS — I130 Hypertensive heart and chronic kidney disease with heart failure and stage 1 through stage 4 chronic kidney disease, or unspecified chronic kidney disease: Secondary | ICD-10-CM | POA: Diagnosis not present

## 2023-01-22 DIAGNOSIS — N1832 Chronic kidney disease, stage 3b: Secondary | ICD-10-CM | POA: Diagnosis not present

## 2023-01-23 DIAGNOSIS — D519 Vitamin B12 deficiency anemia, unspecified: Secondary | ICD-10-CM | POA: Diagnosis not present

## 2023-01-23 DIAGNOSIS — F0390 Unspecified dementia without behavioral disturbance: Secondary | ICD-10-CM | POA: Diagnosis not present

## 2023-01-23 DIAGNOSIS — I5043 Acute on chronic combined systolic (congestive) and diastolic (congestive) heart failure: Secondary | ICD-10-CM | POA: Diagnosis not present

## 2023-01-23 DIAGNOSIS — I130 Hypertensive heart and chronic kidney disease with heart failure and stage 1 through stage 4 chronic kidney disease, or unspecified chronic kidney disease: Secondary | ICD-10-CM | POA: Diagnosis not present

## 2023-01-23 DIAGNOSIS — N179 Acute kidney failure, unspecified: Secondary | ICD-10-CM | POA: Diagnosis not present

## 2023-01-23 DIAGNOSIS — N1832 Chronic kidney disease, stage 3b: Secondary | ICD-10-CM | POA: Diagnosis not present

## 2023-01-25 DIAGNOSIS — F0390 Unspecified dementia without behavioral disturbance: Secondary | ICD-10-CM | POA: Diagnosis not present

## 2023-01-25 DIAGNOSIS — N179 Acute kidney failure, unspecified: Secondary | ICD-10-CM | POA: Diagnosis not present

## 2023-01-25 DIAGNOSIS — I130 Hypertensive heart and chronic kidney disease with heart failure and stage 1 through stage 4 chronic kidney disease, or unspecified chronic kidney disease: Secondary | ICD-10-CM | POA: Diagnosis not present

## 2023-01-25 DIAGNOSIS — D519 Vitamin B12 deficiency anemia, unspecified: Secondary | ICD-10-CM | POA: Diagnosis not present

## 2023-01-25 DIAGNOSIS — N1832 Chronic kidney disease, stage 3b: Secondary | ICD-10-CM | POA: Diagnosis not present

## 2023-01-25 DIAGNOSIS — I5043 Acute on chronic combined systolic (congestive) and diastolic (congestive) heart failure: Secondary | ICD-10-CM | POA: Diagnosis not present

## 2023-01-30 DIAGNOSIS — I5043 Acute on chronic combined systolic (congestive) and diastolic (congestive) heart failure: Secondary | ICD-10-CM | POA: Diagnosis not present

## 2023-01-30 DIAGNOSIS — N1832 Chronic kidney disease, stage 3b: Secondary | ICD-10-CM | POA: Diagnosis not present

## 2023-01-30 DIAGNOSIS — F0390 Unspecified dementia without behavioral disturbance: Secondary | ICD-10-CM | POA: Diagnosis not present

## 2023-01-30 DIAGNOSIS — D519 Vitamin B12 deficiency anemia, unspecified: Secondary | ICD-10-CM | POA: Diagnosis not present

## 2023-01-30 DIAGNOSIS — N179 Acute kidney failure, unspecified: Secondary | ICD-10-CM | POA: Diagnosis not present

## 2023-01-30 DIAGNOSIS — I130 Hypertensive heart and chronic kidney disease with heart failure and stage 1 through stage 4 chronic kidney disease, or unspecified chronic kidney disease: Secondary | ICD-10-CM | POA: Diagnosis not present

## 2023-01-31 DIAGNOSIS — I5043 Acute on chronic combined systolic (congestive) and diastolic (congestive) heart failure: Secondary | ICD-10-CM | POA: Diagnosis not present

## 2023-01-31 DIAGNOSIS — N1832 Chronic kidney disease, stage 3b: Secondary | ICD-10-CM | POA: Diagnosis not present

## 2023-01-31 DIAGNOSIS — I130 Hypertensive heart and chronic kidney disease with heart failure and stage 1 through stage 4 chronic kidney disease, or unspecified chronic kidney disease: Secondary | ICD-10-CM | POA: Diagnosis not present

## 2023-01-31 DIAGNOSIS — N179 Acute kidney failure, unspecified: Secondary | ICD-10-CM | POA: Diagnosis not present

## 2023-01-31 DIAGNOSIS — F0390 Unspecified dementia without behavioral disturbance: Secondary | ICD-10-CM | POA: Diagnosis not present

## 2023-01-31 DIAGNOSIS — D519 Vitamin B12 deficiency anemia, unspecified: Secondary | ICD-10-CM | POA: Diagnosis not present

## 2023-02-01 DIAGNOSIS — N1832 Chronic kidney disease, stage 3b: Secondary | ICD-10-CM | POA: Diagnosis not present

## 2023-02-01 DIAGNOSIS — F0390 Unspecified dementia without behavioral disturbance: Secondary | ICD-10-CM | POA: Diagnosis not present

## 2023-02-01 DIAGNOSIS — D519 Vitamin B12 deficiency anemia, unspecified: Secondary | ICD-10-CM | POA: Diagnosis not present

## 2023-02-01 DIAGNOSIS — I5043 Acute on chronic combined systolic (congestive) and diastolic (congestive) heart failure: Secondary | ICD-10-CM | POA: Diagnosis not present

## 2023-02-01 DIAGNOSIS — I130 Hypertensive heart and chronic kidney disease with heart failure and stage 1 through stage 4 chronic kidney disease, or unspecified chronic kidney disease: Secondary | ICD-10-CM | POA: Diagnosis not present

## 2023-02-01 DIAGNOSIS — N179 Acute kidney failure, unspecified: Secondary | ICD-10-CM | POA: Diagnosis not present

## 2023-02-01 NOTE — Telephone Encounter (Signed)
Weve attempted to call numerous times with voicemails being left with no return phone call. Will close encounter for now, hopefully will arrive for January appointment.

## 2023-02-06 DIAGNOSIS — I130 Hypertensive heart and chronic kidney disease with heart failure and stage 1 through stage 4 chronic kidney disease, or unspecified chronic kidney disease: Secondary | ICD-10-CM | POA: Diagnosis not present

## 2023-02-06 DIAGNOSIS — D519 Vitamin B12 deficiency anemia, unspecified: Secondary | ICD-10-CM | POA: Diagnosis not present

## 2023-02-06 DIAGNOSIS — N1832 Chronic kidney disease, stage 3b: Secondary | ICD-10-CM | POA: Diagnosis not present

## 2023-02-06 DIAGNOSIS — F0390 Unspecified dementia without behavioral disturbance: Secondary | ICD-10-CM | POA: Diagnosis not present

## 2023-02-06 DIAGNOSIS — N179 Acute kidney failure, unspecified: Secondary | ICD-10-CM | POA: Diagnosis not present

## 2023-02-06 DIAGNOSIS — I5043 Acute on chronic combined systolic (congestive) and diastolic (congestive) heart failure: Secondary | ICD-10-CM | POA: Diagnosis not present

## 2023-02-12 DIAGNOSIS — Z23 Encounter for immunization: Secondary | ICD-10-CM | POA: Diagnosis not present

## 2023-02-12 DIAGNOSIS — S91301A Unspecified open wound, right foot, initial encounter: Secondary | ICD-10-CM | POA: Diagnosis not present

## 2023-02-12 DIAGNOSIS — N400 Enlarged prostate without lower urinary tract symptoms: Secondary | ICD-10-CM | POA: Diagnosis not present

## 2023-02-13 DIAGNOSIS — N179 Acute kidney failure, unspecified: Secondary | ICD-10-CM | POA: Diagnosis not present

## 2023-02-13 DIAGNOSIS — I5043 Acute on chronic combined systolic (congestive) and diastolic (congestive) heart failure: Secondary | ICD-10-CM | POA: Diagnosis not present

## 2023-02-13 DIAGNOSIS — D519 Vitamin B12 deficiency anemia, unspecified: Secondary | ICD-10-CM | POA: Diagnosis not present

## 2023-02-13 DIAGNOSIS — F0390 Unspecified dementia without behavioral disturbance: Secondary | ICD-10-CM | POA: Diagnosis not present

## 2023-02-13 DIAGNOSIS — I130 Hypertensive heart and chronic kidney disease with heart failure and stage 1 through stage 4 chronic kidney disease, or unspecified chronic kidney disease: Secondary | ICD-10-CM | POA: Diagnosis not present

## 2023-02-13 DIAGNOSIS — N1832 Chronic kidney disease, stage 3b: Secondary | ICD-10-CM | POA: Diagnosis not present

## 2023-02-14 DIAGNOSIS — N179 Acute kidney failure, unspecified: Secondary | ICD-10-CM | POA: Diagnosis not present

## 2023-02-14 DIAGNOSIS — R634 Abnormal weight loss: Secondary | ICD-10-CM | POA: Diagnosis not present

## 2023-02-14 DIAGNOSIS — Z79899 Other long term (current) drug therapy: Secondary | ICD-10-CM | POA: Diagnosis not present

## 2023-02-14 DIAGNOSIS — Z993 Dependence on wheelchair: Secondary | ICD-10-CM | POA: Diagnosis not present

## 2023-02-14 DIAGNOSIS — I130 Hypertensive heart and chronic kidney disease with heart failure and stage 1 through stage 4 chronic kidney disease, or unspecified chronic kidney disease: Secondary | ICD-10-CM | POA: Diagnosis not present

## 2023-02-14 DIAGNOSIS — N1832 Chronic kidney disease, stage 3b: Secondary | ICD-10-CM | POA: Diagnosis not present

## 2023-02-14 DIAGNOSIS — Z8673 Personal history of transient ischemic attack (TIA), and cerebral infarction without residual deficits: Secondary | ICD-10-CM | POA: Diagnosis not present

## 2023-02-14 DIAGNOSIS — D696 Thrombocytopenia, unspecified: Secondary | ICD-10-CM | POA: Diagnosis not present

## 2023-02-14 DIAGNOSIS — Z9181 History of falling: Secondary | ICD-10-CM | POA: Diagnosis not present

## 2023-02-14 DIAGNOSIS — D519 Vitamin B12 deficiency anemia, unspecified: Secondary | ICD-10-CM | POA: Diagnosis not present

## 2023-02-14 DIAGNOSIS — M25541 Pain in joints of right hand: Secondary | ICD-10-CM | POA: Diagnosis not present

## 2023-02-14 DIAGNOSIS — M47816 Spondylosis without myelopathy or radiculopathy, lumbar region: Secondary | ICD-10-CM | POA: Diagnosis not present

## 2023-02-14 DIAGNOSIS — E042 Nontoxic multinodular goiter: Secondary | ICD-10-CM | POA: Diagnosis not present

## 2023-02-14 DIAGNOSIS — N400 Enlarged prostate without lower urinary tract symptoms: Secondary | ICD-10-CM | POA: Diagnosis not present

## 2023-02-14 DIAGNOSIS — M5459 Other low back pain: Secondary | ICD-10-CM | POA: Diagnosis not present

## 2023-02-14 DIAGNOSIS — I5043 Acute on chronic combined systolic (congestive) and diastolic (congestive) heart failure: Secondary | ICD-10-CM | POA: Diagnosis not present

## 2023-02-14 DIAGNOSIS — M6389 Disorders of muscle in diseases classified elsewhere, multiple sites: Secondary | ICD-10-CM | POA: Diagnosis not present

## 2023-02-14 DIAGNOSIS — I700 Atherosclerosis of aorta: Secondary | ICD-10-CM | POA: Diagnosis not present

## 2023-02-14 DIAGNOSIS — I350 Nonrheumatic aortic (valve) stenosis: Secondary | ICD-10-CM | POA: Diagnosis not present

## 2023-02-14 DIAGNOSIS — I872 Venous insufficiency (chronic) (peripheral): Secondary | ICD-10-CM | POA: Diagnosis not present

## 2023-02-14 DIAGNOSIS — B353 Tinea pedis: Secondary | ICD-10-CM | POA: Diagnosis not present

## 2023-02-14 DIAGNOSIS — F0390 Unspecified dementia without behavioral disturbance: Secondary | ICD-10-CM | POA: Diagnosis not present

## 2023-02-14 DIAGNOSIS — M25542 Pain in joints of left hand: Secondary | ICD-10-CM | POA: Diagnosis not present

## 2023-02-19 DIAGNOSIS — I5043 Acute on chronic combined systolic (congestive) and diastolic (congestive) heart failure: Secondary | ICD-10-CM | POA: Diagnosis not present

## 2023-02-19 DIAGNOSIS — D519 Vitamin B12 deficiency anemia, unspecified: Secondary | ICD-10-CM | POA: Diagnosis not present

## 2023-02-19 DIAGNOSIS — F0390 Unspecified dementia without behavioral disturbance: Secondary | ICD-10-CM | POA: Diagnosis not present

## 2023-02-19 DIAGNOSIS — N1832 Chronic kidney disease, stage 3b: Secondary | ICD-10-CM | POA: Diagnosis not present

## 2023-02-19 DIAGNOSIS — I130 Hypertensive heart and chronic kidney disease with heart failure and stage 1 through stage 4 chronic kidney disease, or unspecified chronic kidney disease: Secondary | ICD-10-CM | POA: Diagnosis not present

## 2023-02-19 DIAGNOSIS — N179 Acute kidney failure, unspecified: Secondary | ICD-10-CM | POA: Diagnosis not present

## 2023-02-21 DIAGNOSIS — F0390 Unspecified dementia without behavioral disturbance: Secondary | ICD-10-CM | POA: Diagnosis not present

## 2023-02-21 DIAGNOSIS — N179 Acute kidney failure, unspecified: Secondary | ICD-10-CM | POA: Diagnosis not present

## 2023-02-21 DIAGNOSIS — N1832 Chronic kidney disease, stage 3b: Secondary | ICD-10-CM | POA: Diagnosis not present

## 2023-02-21 DIAGNOSIS — I5043 Acute on chronic combined systolic (congestive) and diastolic (congestive) heart failure: Secondary | ICD-10-CM | POA: Diagnosis not present

## 2023-02-21 DIAGNOSIS — I130 Hypertensive heart and chronic kidney disease with heart failure and stage 1 through stage 4 chronic kidney disease, or unspecified chronic kidney disease: Secondary | ICD-10-CM | POA: Diagnosis not present

## 2023-02-21 DIAGNOSIS — D519 Vitamin B12 deficiency anemia, unspecified: Secondary | ICD-10-CM | POA: Diagnosis not present

## 2023-02-22 DIAGNOSIS — N1832 Chronic kidney disease, stage 3b: Secondary | ICD-10-CM | POA: Diagnosis not present

## 2023-02-22 DIAGNOSIS — D519 Vitamin B12 deficiency anemia, unspecified: Secondary | ICD-10-CM | POA: Diagnosis not present

## 2023-02-22 DIAGNOSIS — F0390 Unspecified dementia without behavioral disturbance: Secondary | ICD-10-CM | POA: Diagnosis not present

## 2023-02-22 DIAGNOSIS — I5043 Acute on chronic combined systolic (congestive) and diastolic (congestive) heart failure: Secondary | ICD-10-CM | POA: Diagnosis not present

## 2023-02-22 DIAGNOSIS — I130 Hypertensive heart and chronic kidney disease with heart failure and stage 1 through stage 4 chronic kidney disease, or unspecified chronic kidney disease: Secondary | ICD-10-CM | POA: Diagnosis not present

## 2023-02-22 DIAGNOSIS — N179 Acute kidney failure, unspecified: Secondary | ICD-10-CM | POA: Diagnosis not present

## 2023-02-23 DIAGNOSIS — F0390 Unspecified dementia without behavioral disturbance: Secondary | ICD-10-CM | POA: Diagnosis not present

## 2023-02-23 DIAGNOSIS — N1832 Chronic kidney disease, stage 3b: Secondary | ICD-10-CM | POA: Diagnosis not present

## 2023-02-23 DIAGNOSIS — I5043 Acute on chronic combined systolic (congestive) and diastolic (congestive) heart failure: Secondary | ICD-10-CM | POA: Diagnosis not present

## 2023-02-23 DIAGNOSIS — I130 Hypertensive heart and chronic kidney disease with heart failure and stage 1 through stage 4 chronic kidney disease, or unspecified chronic kidney disease: Secondary | ICD-10-CM | POA: Diagnosis not present

## 2023-02-23 DIAGNOSIS — N179 Acute kidney failure, unspecified: Secondary | ICD-10-CM | POA: Diagnosis not present

## 2023-02-23 DIAGNOSIS — D519 Vitamin B12 deficiency anemia, unspecified: Secondary | ICD-10-CM | POA: Diagnosis not present

## 2023-02-26 DIAGNOSIS — I5043 Acute on chronic combined systolic (congestive) and diastolic (congestive) heart failure: Secondary | ICD-10-CM | POA: Diagnosis not present

## 2023-02-26 DIAGNOSIS — F0390 Unspecified dementia without behavioral disturbance: Secondary | ICD-10-CM | POA: Diagnosis not present

## 2023-02-26 DIAGNOSIS — D519 Vitamin B12 deficiency anemia, unspecified: Secondary | ICD-10-CM | POA: Diagnosis not present

## 2023-02-26 DIAGNOSIS — I130 Hypertensive heart and chronic kidney disease with heart failure and stage 1 through stage 4 chronic kidney disease, or unspecified chronic kidney disease: Secondary | ICD-10-CM | POA: Diagnosis not present

## 2023-02-26 DIAGNOSIS — N179 Acute kidney failure, unspecified: Secondary | ICD-10-CM | POA: Diagnosis not present

## 2023-02-26 DIAGNOSIS — N1832 Chronic kidney disease, stage 3b: Secondary | ICD-10-CM | POA: Diagnosis not present

## 2023-02-27 DIAGNOSIS — D638 Anemia in other chronic diseases classified elsewhere: Secondary | ICD-10-CM | POA: Diagnosis not present

## 2023-02-27 DIAGNOSIS — F411 Generalized anxiety disorder: Secondary | ICD-10-CM | POA: Diagnosis not present

## 2023-02-27 DIAGNOSIS — M159 Polyosteoarthritis, unspecified: Secondary | ICD-10-CM | POA: Diagnosis not present

## 2023-02-27 DIAGNOSIS — I110 Hypertensive heart disease with heart failure: Secondary | ICD-10-CM | POA: Diagnosis not present

## 2023-02-27 DIAGNOSIS — R54 Age-related physical debility: Secondary | ICD-10-CM | POA: Diagnosis not present

## 2023-02-27 DIAGNOSIS — I5022 Chronic systolic (congestive) heart failure: Secondary | ICD-10-CM | POA: Diagnosis not present

## 2023-03-02 DIAGNOSIS — N1832 Chronic kidney disease, stage 3b: Secondary | ICD-10-CM | POA: Diagnosis not present

## 2023-03-02 DIAGNOSIS — F0390 Unspecified dementia without behavioral disturbance: Secondary | ICD-10-CM | POA: Diagnosis not present

## 2023-03-02 DIAGNOSIS — D519 Vitamin B12 deficiency anemia, unspecified: Secondary | ICD-10-CM | POA: Diagnosis not present

## 2023-03-02 DIAGNOSIS — N179 Acute kidney failure, unspecified: Secondary | ICD-10-CM | POA: Diagnosis not present

## 2023-03-02 DIAGNOSIS — I130 Hypertensive heart and chronic kidney disease with heart failure and stage 1 through stage 4 chronic kidney disease, or unspecified chronic kidney disease: Secondary | ICD-10-CM | POA: Diagnosis not present

## 2023-03-02 DIAGNOSIS — I5043 Acute on chronic combined systolic (congestive) and diastolic (congestive) heart failure: Secondary | ICD-10-CM | POA: Diagnosis not present

## 2023-03-06 DIAGNOSIS — F0390 Unspecified dementia without behavioral disturbance: Secondary | ICD-10-CM | POA: Diagnosis not present

## 2023-03-06 DIAGNOSIS — N179 Acute kidney failure, unspecified: Secondary | ICD-10-CM | POA: Diagnosis not present

## 2023-03-06 DIAGNOSIS — I130 Hypertensive heart and chronic kidney disease with heart failure and stage 1 through stage 4 chronic kidney disease, or unspecified chronic kidney disease: Secondary | ICD-10-CM | POA: Diagnosis not present

## 2023-03-06 DIAGNOSIS — D519 Vitamin B12 deficiency anemia, unspecified: Secondary | ICD-10-CM | POA: Diagnosis not present

## 2023-03-06 DIAGNOSIS — I5043 Acute on chronic combined systolic (congestive) and diastolic (congestive) heart failure: Secondary | ICD-10-CM | POA: Diagnosis not present

## 2023-03-06 DIAGNOSIS — N1832 Chronic kidney disease, stage 3b: Secondary | ICD-10-CM | POA: Diagnosis not present

## 2023-03-08 DIAGNOSIS — N1832 Chronic kidney disease, stage 3b: Secondary | ICD-10-CM | POA: Diagnosis not present

## 2023-03-08 DIAGNOSIS — I5043 Acute on chronic combined systolic (congestive) and diastolic (congestive) heart failure: Secondary | ICD-10-CM | POA: Diagnosis not present

## 2023-03-08 DIAGNOSIS — I130 Hypertensive heart and chronic kidney disease with heart failure and stage 1 through stage 4 chronic kidney disease, or unspecified chronic kidney disease: Secondary | ICD-10-CM | POA: Diagnosis not present

## 2023-03-08 DIAGNOSIS — F0390 Unspecified dementia without behavioral disturbance: Secondary | ICD-10-CM | POA: Diagnosis not present

## 2023-03-08 DIAGNOSIS — N179 Acute kidney failure, unspecified: Secondary | ICD-10-CM | POA: Diagnosis not present

## 2023-03-08 DIAGNOSIS — D519 Vitamin B12 deficiency anemia, unspecified: Secondary | ICD-10-CM | POA: Diagnosis not present

## 2023-03-12 DIAGNOSIS — N179 Acute kidney failure, unspecified: Secondary | ICD-10-CM | POA: Diagnosis not present

## 2023-03-12 DIAGNOSIS — Z0001 Encounter for general adult medical examination with abnormal findings: Secondary | ICD-10-CM | POA: Diagnosis not present

## 2023-03-12 DIAGNOSIS — I130 Hypertensive heart and chronic kidney disease with heart failure and stage 1 through stage 4 chronic kidney disease, or unspecified chronic kidney disease: Secondary | ICD-10-CM | POA: Diagnosis not present

## 2023-03-12 DIAGNOSIS — D519 Vitamin B12 deficiency anemia, unspecified: Secondary | ICD-10-CM | POA: Diagnosis not present

## 2023-03-12 DIAGNOSIS — N1832 Chronic kidney disease, stage 3b: Secondary | ICD-10-CM | POA: Diagnosis not present

## 2023-03-12 DIAGNOSIS — N400 Enlarged prostate without lower urinary tract symptoms: Secondary | ICD-10-CM | POA: Diagnosis not present

## 2023-03-12 DIAGNOSIS — I5043 Acute on chronic combined systolic (congestive) and diastolic (congestive) heart failure: Secondary | ICD-10-CM | POA: Diagnosis not present

## 2023-03-12 DIAGNOSIS — F0390 Unspecified dementia without behavioral disturbance: Secondary | ICD-10-CM | POA: Diagnosis not present

## 2023-03-13 ENCOUNTER — Ambulatory Visit: Payer: Medicare Other | Admitting: Nurse Practitioner

## 2023-03-13 DIAGNOSIS — D519 Vitamin B12 deficiency anemia, unspecified: Secondary | ICD-10-CM | POA: Diagnosis not present

## 2023-03-13 DIAGNOSIS — F0390 Unspecified dementia without behavioral disturbance: Secondary | ICD-10-CM | POA: Diagnosis not present

## 2023-03-13 DIAGNOSIS — I5043 Acute on chronic combined systolic (congestive) and diastolic (congestive) heart failure: Secondary | ICD-10-CM | POA: Diagnosis not present

## 2023-03-13 DIAGNOSIS — N179 Acute kidney failure, unspecified: Secondary | ICD-10-CM | POA: Diagnosis not present

## 2023-03-13 DIAGNOSIS — N1832 Chronic kidney disease, stage 3b: Secondary | ICD-10-CM | POA: Diagnosis not present

## 2023-03-13 DIAGNOSIS — I130 Hypertensive heart and chronic kidney disease with heart failure and stage 1 through stage 4 chronic kidney disease, or unspecified chronic kidney disease: Secondary | ICD-10-CM | POA: Diagnosis not present

## 2023-03-15 DIAGNOSIS — I5043 Acute on chronic combined systolic (congestive) and diastolic (congestive) heart failure: Secondary | ICD-10-CM | POA: Diagnosis not present

## 2023-03-15 DIAGNOSIS — N179 Acute kidney failure, unspecified: Secondary | ICD-10-CM | POA: Diagnosis not present

## 2023-03-15 DIAGNOSIS — N1832 Chronic kidney disease, stage 3b: Secondary | ICD-10-CM | POA: Diagnosis not present

## 2023-03-15 DIAGNOSIS — I130 Hypertensive heart and chronic kidney disease with heart failure and stage 1 through stage 4 chronic kidney disease, or unspecified chronic kidney disease: Secondary | ICD-10-CM | POA: Diagnosis not present

## 2023-03-15 DIAGNOSIS — F0390 Unspecified dementia without behavioral disturbance: Secondary | ICD-10-CM | POA: Diagnosis not present

## 2023-03-15 DIAGNOSIS — D519 Vitamin B12 deficiency anemia, unspecified: Secondary | ICD-10-CM | POA: Diagnosis not present

## 2023-03-26 ENCOUNTER — Emergency Department (HOSPITAL_COMMUNITY)
Admission: EM | Admit: 2023-03-26 | Discharge: 2023-03-27 | Disposition: A | Discharge status: Home / Self Care | Payer: Medicare Other | Attending: Student | Admitting: Student

## 2023-03-26 ENCOUNTER — Emergency Department (HOSPITAL_COMMUNITY): Payer: Medicare Other

## 2023-03-26 ENCOUNTER — Other Ambulatory Visit: Payer: Self-pay

## 2023-03-26 ENCOUNTER — Encounter (HOSPITAL_COMMUNITY): Payer: Self-pay

## 2023-03-26 DIAGNOSIS — N1832 Chronic kidney disease, stage 3b: Secondary | ICD-10-CM | POA: Diagnosis not present

## 2023-03-26 DIAGNOSIS — J90 Pleural effusion, not elsewhere classified: Secondary | ICD-10-CM | POA: Insufficient documentation

## 2023-03-26 DIAGNOSIS — Z043 Encounter for examination and observation following other accident: Secondary | ICD-10-CM | POA: Diagnosis not present

## 2023-03-26 DIAGNOSIS — Y92009 Unspecified place in unspecified non-institutional (private) residence as the place of occurrence of the external cause: Secondary | ICD-10-CM | POA: Diagnosis not present

## 2023-03-26 DIAGNOSIS — R918 Other nonspecific abnormal finding of lung field: Secondary | ICD-10-CM | POA: Diagnosis not present

## 2023-03-26 DIAGNOSIS — S0990XA Unspecified injury of head, initial encounter: Secondary | ICD-10-CM | POA: Diagnosis present

## 2023-03-26 DIAGNOSIS — M19072 Primary osteoarthritis, left ankle and foot: Secondary | ICD-10-CM | POA: Diagnosis not present

## 2023-03-26 DIAGNOSIS — R0602 Shortness of breath: Secondary | ICD-10-CM | POA: Diagnosis not present

## 2023-03-26 DIAGNOSIS — I5043 Acute on chronic combined systolic (congestive) and diastolic (congestive) heart failure: Secondary | ICD-10-CM | POA: Diagnosis not present

## 2023-03-26 DIAGNOSIS — I3139 Other pericardial effusion (noninflammatory): Secondary | ICD-10-CM | POA: Diagnosis not present

## 2023-03-26 DIAGNOSIS — W19XXXA Unspecified fall, initial encounter: Secondary | ICD-10-CM | POA: Diagnosis not present

## 2023-03-26 DIAGNOSIS — M25551 Pain in right hip: Secondary | ICD-10-CM | POA: Diagnosis not present

## 2023-03-26 DIAGNOSIS — M25572 Pain in left ankle and joints of left foot: Secondary | ICD-10-CM | POA: Diagnosis not present

## 2023-03-26 DIAGNOSIS — F0390 Unspecified dementia without behavioral disturbance: Secondary | ICD-10-CM | POA: Diagnosis not present

## 2023-03-26 DIAGNOSIS — J9811 Atelectasis: Secondary | ICD-10-CM | POA: Diagnosis not present

## 2023-03-26 DIAGNOSIS — G319 Degenerative disease of nervous system, unspecified: Secondary | ICD-10-CM | POA: Diagnosis not present

## 2023-03-26 DIAGNOSIS — R9082 White matter disease, unspecified: Secondary | ICD-10-CM | POA: Diagnosis not present

## 2023-03-26 DIAGNOSIS — R4182 Altered mental status, unspecified: Secondary | ICD-10-CM | POA: Diagnosis not present

## 2023-03-26 NOTE — ED Triage Notes (Signed)
Pt brought in via [facility], admitted for mechanical fall at home. Pt has baseline dementia. Post fall pt complains of left leg and hip pain; unable to straight legs.  BP 120/78; pulse 78; 98% RA; cbg 161.

## 2023-03-27 ENCOUNTER — Emergency Department (HOSPITAL_COMMUNITY): Payer: Medicare Other

## 2023-03-27 ENCOUNTER — Encounter (HOSPITAL_COMMUNITY): Payer: Self-pay

## 2023-03-27 DIAGNOSIS — R531 Weakness: Secondary | ICD-10-CM | POA: Diagnosis not present

## 2023-03-27 DIAGNOSIS — M19072 Primary osteoarthritis, left ankle and foot: Secondary | ICD-10-CM | POA: Diagnosis not present

## 2023-03-27 DIAGNOSIS — R9082 White matter disease, unspecified: Secondary | ICD-10-CM | POA: Diagnosis not present

## 2023-03-27 DIAGNOSIS — F0390 Unspecified dementia without behavioral disturbance: Secondary | ICD-10-CM | POA: Diagnosis not present

## 2023-03-27 DIAGNOSIS — J90 Pleural effusion, not elsewhere classified: Secondary | ICD-10-CM | POA: Diagnosis not present

## 2023-03-27 DIAGNOSIS — J9811 Atelectasis: Secondary | ICD-10-CM | POA: Diagnosis not present

## 2023-03-27 DIAGNOSIS — G319 Degenerative disease of nervous system, unspecified: Secondary | ICD-10-CM | POA: Diagnosis not present

## 2023-03-27 DIAGNOSIS — Z7401 Bed confinement status: Secondary | ICD-10-CM | POA: Diagnosis not present

## 2023-03-27 DIAGNOSIS — R0602 Shortness of breath: Secondary | ICD-10-CM | POA: Diagnosis not present

## 2023-03-27 DIAGNOSIS — I3139 Other pericardial effusion (noninflammatory): Secondary | ICD-10-CM | POA: Diagnosis not present

## 2023-03-27 DIAGNOSIS — Z043 Encounter for examination and observation following other accident: Secondary | ICD-10-CM | POA: Diagnosis not present

## 2023-03-27 DIAGNOSIS — S0990XA Unspecified injury of head, initial encounter: Secondary | ICD-10-CM | POA: Diagnosis not present

## 2023-03-27 DIAGNOSIS — W19XXXA Unspecified fall, initial encounter: Secondary | ICD-10-CM | POA: Diagnosis not present

## 2023-03-27 DIAGNOSIS — R918 Other nonspecific abnormal finding of lung field: Secondary | ICD-10-CM | POA: Diagnosis not present

## 2023-03-27 LAB — CBC WITH DIFFERENTIAL/PLATELET
Abs Immature Granulocytes: 0 10*3/uL (ref 0.00–0.07)
Basophils Absolute: 0 10*3/uL (ref 0.0–0.1)
Basophils Relative: 1 %
Eosinophils Absolute: 0 10*3/uL (ref 0.0–0.5)
Eosinophils Relative: 1 %
HCT: 36.6 % — ABNORMAL LOW (ref 39.0–52.0)
Hemoglobin: 11.8 g/dL — ABNORMAL LOW (ref 13.0–17.0)
Immature Granulocytes: 0 %
Lymphocytes Relative: 27 %
Lymphs Abs: 0.8 10*3/uL (ref 0.7–4.0)
MCH: 33.3 pg (ref 26.0–34.0)
MCHC: 32.2 g/dL (ref 30.0–36.0)
MCV: 103.4 fL — ABNORMAL HIGH (ref 80.0–100.0)
Monocytes Absolute: 0.3 10*3/uL (ref 0.1–1.0)
Monocytes Relative: 12 %
Neutro Abs: 1.7 10*3/uL (ref 1.7–7.7)
Neutrophils Relative %: 59 %
Platelets: 89 10*3/uL — ABNORMAL LOW (ref 150–400)
RBC: 3.54 MIL/uL — ABNORMAL LOW (ref 4.22–5.81)
RDW: 14.1 % (ref 11.5–15.5)
WBC: 2.9 10*3/uL — ABNORMAL LOW (ref 4.0–10.5)
nRBC: 0 % (ref 0.0–0.2)

## 2023-03-27 LAB — COMPREHENSIVE METABOLIC PANEL
ALT: 26 U/L (ref 0–44)
AST: 34 U/L (ref 15–41)
Albumin: 4.1 g/dL (ref 3.5–5.0)
Alkaline Phosphatase: 70 U/L (ref 38–126)
Anion gap: 8 (ref 5–15)
BUN: 49 mg/dL — ABNORMAL HIGH (ref 8–23)
CO2: 27 mmol/L (ref 22–32)
Calcium: 9.2 mg/dL (ref 8.9–10.3)
Chloride: 104 mmol/L (ref 98–111)
Creatinine, Ser: 1.35 mg/dL — ABNORMAL HIGH (ref 0.61–1.24)
GFR, Estimated: 47 mL/min — ABNORMAL LOW (ref >60)
Glucose, Bld: 109 mg/dL — ABNORMAL HIGH (ref 70–99)
Potassium: 4.4 mmol/L (ref 3.5–5.1)
Sodium: 139 mmol/L (ref 135–145)
Total Bilirubin: 1 mg/dL (ref <1.2)
Total Protein: 7 g/dL (ref 6.5–8.1)

## 2023-03-27 LAB — BRAIN NATRIURETIC PEPTIDE: B Natriuretic Peptide: 1694.3 pg/mL — ABNORMAL HIGH (ref 0.0–100.0)

## 2023-03-27 MED ORDER — FUROSEMIDE 10 MG/ML IJ SOLN
40.0000 mg | Freq: Once | INTRAMUSCULAR | Status: AC
  Administered 2023-03-27: 40 mg via INTRAVENOUS
  Filled 2023-03-27: qty 4

## 2023-03-27 NOTE — ED Provider Notes (Signed)
[place] EMERGENCY DEPARTMENT AT [facility] Provider Note  CSN: [id number] Arrival date & time: 03/26/23 2304  Chief Complaint(s) No chief complaint on file.  HPI John Rose is a 89 y.o. male with PMH CKD 3, CHF, gout who presents emergency room for evaluation of a mechanical fall.  Patient was using his walker and drinking a protein shake at the same time when he fell onto his left hip and foot.  Was unable to get off the ground.  EMS concern for hip deformity and stating that patient would not straighten his leg but on my arrival patient is not complaining of any pain in the hip and his legs are both fully stretched out.  Currently denies chest pain, shortness of breath, headache, fever, numbness, tingling, weakness or other systemic, traumatic or neurologic complaints.  Does not take blood thinners.  No head strike or loss of consciousness.   Past Medical History Past Medical History:  Diagnosis Date   Lumbar spondylosis 11/10/2016   Testosterone deficiency    Patient Active Problem List   Diagnosis Date Noted   BPH (benign prostatic hyperplasia) 12/13/2022   Acute on chronic combined systolic and diastolic CHF (congestive heart failure) (HCC) 12/13/2022   Acute on chronic diastolic (congestive) heart failure (HCC) 12/10/2022   Mixed hyperlipidemia 09/07/2022   Stage 3b chronic kidney disease (HCC) 09/07/2022   PAC (premature atrial contraction) 09/07/2022   Dementia (HCC) 09/07/2022   Bilateral lower extremity edema 12/14/2021   Urinary tract infection without hematuria 12/14/2021   Acute on chronic heart failure with preserved ejection fraction (HCC) 12/14/2021   Rhabdomyolysis 08/04/2021   Aortic atherosclerosis (HCC) 08/04/2021   Pain of left hip joint 04/13/2021   Lumbar spondylosis 11/10/2016   Home Medication(s) Prior to Admission medications   Medication Sig Start Date End Date Taking? Authorizing Provider  acetaminophen (TYLENOL) 650 MG CR tablet  Take 650 mg by mouth every 8 (eight) hours as needed for pain.    [provider]  diclofenac Sodium (VOLTAREN) 1 % GEL Apply 2 g topically every 8 (eight) hours. 10/17/22   [provider]  furosemide (LASIX) 80 MG tablet Take 1 tablet (80 mg total) by mouth daily. 12/20/22   Rai, Delene Ruffini, MD  furosemide (LASIX) 80 MG tablet Take 1 tablet (80 mg total) by mouth daily. 12/19/22   Rai, Delene Ruffini, MD  gabapentin (NEURONTIN) 300 MG capsule Take 300 mg by mouth 2 (two) times daily. 11/19/21   [provider]  hydrocortisone (ANUSOL-HC) 2.5 % rectal cream Apply topically as needed for hemorrhoids or anal itching. 07/18/22   [provider]  metoprolol succinate (TOPROL-XL) 25 MG 24 hr tablet Take 1 tablet (25 mg total) by mouth daily. Take with or immediately following a meal. 12/20/22   Rai, Ripudeep K, MD  Multiple Vitamin (MULTIVITAMIN ADULT PO) Take 1 tablet by mouth daily.    [provider]  QUEtiapine (SEROQUEL) 25 MG tablet Take 1 tablet (25 mg total) by mouth at bedtime. 12/19/22   Rai, Ripudeep K, MD  senna-docusate (SENOKOT-S) 8.6-50 MG tablet Take 1 tablet by mouth at bedtime as needed for mild constipation. 12/19/22   Rai, Delene Ruffini, MD  simethicone (MYLICON) 80 MG chewable tablet Chew 80 mg by mouth every 6 (six) hours as needed for flatulence.    [provider]  tamsulosin (FLOMAX) 0.4 MG CAPS capsule Take 0.4 mg by mouth daily.  08/16/16   [provider]  Past Surgical History Past Surgical History:  Procedure Laterality Date   NO PAST SURGERIES     Family History Family History  Problem Relation Age of Onset   Hypertension Other     Social History Social History   Tobacco Use   Smoking status: Never   Smokeless tobacco: Never  Vaping Use   Vaping status: Never Used  Substance Use  Topics   Alcohol use: No   Drug use: No   Allergies Patient has no known allergies.  Review of Systems Review of Systems  Musculoskeletal:  Positive for arthralgias and myalgias.    Physical Exam Vital Signs  I have reviewed the triage vital signs BP 125/69 (BP Location: Right Arm)   Pulse 75   Temp 98 F (36.7 C) (Oral)   Resp 20   SpO2 98%   Physical Exam Vitals and nursing note reviewed.  Constitutional:      General: He is not in acute distress.    Appearance: He is well-developed.  HENT:     Head: Normocephalic and atraumatic.  Eyes:     Conjunctiva/sclera: Conjunctivae normal.  Cardiovascular:     Rate and Rhythm: Normal rate and regular rhythm.     Heart sounds: No murmur heard. Pulmonary:     Effort: Pulmonary effort is normal. No respiratory distress.     Breath sounds: Rales present.  Abdominal:     Palpations: Abdomen is soft.     Tenderness: There is no abdominal tenderness.  Musculoskeletal:        General: Tenderness present. No swelling.     Cervical back: Neck supple.  Skin:    General: Skin is warm and dry.     Capillary Refill: Capillary refill takes less than 2 seconds.  Neurological:     Mental Status: He is alert.  Psychiatric:        Mood and Affect: Mood normal.     ED Results and Treatments Labs (all labs ordered are listed, but only abnormal results are displayed) Labs Reviewed  COMPREHENSIVE METABOLIC PANEL - Abnormal; Notable for the following components:      Result Value   Glucose, Bld 109 (*)    BUN 49 (*)    Creatinine, Ser 1.35 (*)    GFR, Estimated 47 (*)    All other components within normal limits  CBC WITH DIFFERENTIAL/PLATELET - Abnormal; Notable for the following components:   WBC 2.9 (*)    RBC 3.54 (*)    Hemoglobin 11.8 (*)    HCT 36.6 (*)    MCV 103.4 (*)    Platelets 89 (*)    All other components within normal limits  BRAIN NATRIURETIC PEPTIDE - Abnormal; Notable for the following components:   B  Natriuretic Peptide 1,694.3 (*)    All other components within normal limits  Radiology CT Chest Wo Contrast Result Date: 03/27/2023 CLINICAL DATA:  Pneumonia, complications suspected. Abnormal chest x-ray today. EXAM: CT CHEST WITHOUT CONTRAST TECHNIQUE: Multidetector CT imaging of the chest was performed following the standard protocol without IV contrast. RADIATION DOSE REDUCTION: This exam was performed according to the departmental dose-optimization program which includes automated exposure control, adjustment of the mA and/or kV according to patient size and/or use of iterative reconstruction technique. COMPARISON:  Portable chest from today, AP Lat chest 12/12/2022, chest CT without contrast 12/10/2022, CTA chest 08/04/2021. FINDINGS: Cardiovascular: The heart is moderately enlarged. Small pericardial effusion is unchanged. There are three-vessel coronary calcifications, atherosclerosis in the aorta and great vessels, calcifications in the aortic valve leaflets no aortic aneurysm. The pulmonary trunk is prominent but unchanged, 3.2 cm. The superior pulmonary veins are distended but no more than previously. Mediastinum/Nodes: Multinodular goiter. Largest nodule 3.5 cm left lower pole. No follow-up imaging recommended due to advanced age and comorbidities. No intrathoracic or axillary adenopathy is seen. The thoracic trachea, thoracic esophagus are unremarkable. Lungs/Pleura: Small left and moderate-sized layering right pleural effusions are again noted, slightly greater fluid on the right than previously, unchanged on the left rib There is compressive atelectasis in the posterior lungs, more so on the right. Underlying pneumonia could be obscured. There is mild diffuse bronchial thickening. Central airways are patent. There is fluid in the fissures bilaterally. No edema or  consolidation are seen in the portions of the lungs not obscured by the effusions. No pulmonary nodules. Upper Abdomen: Small amount of perihepatic ascites. 5.1 cm left hepatic cyst. No other significant upper abdominal findings. Musculoskeletal: Mild thoracic kyphosis with degenerative changes and osteopenia. There is edema in the chest wall, slightly greater than previously. IMPRESSION: 1. Small left and moderate-sized layering right pleural effusions, slightly greater fluid on the right than previously, unchanged on the left. 2. Compressive atelectasis in the posterior lungs, more so on the right. Underlying pneumonia could be obscured. 3. Diffuse bronchial thickening, query bronchitis or reactive airways disease. 4. Cardiomegaly with small pericardial effusion, unchanged. 5. Aortic and coronary artery atherosclerosis. 6. Prominent pulmonary trunk and superior pulmonary veins, but no more than previously. Edema in the chest wall however is slightly greater than previously. 7. Small amount of perihepatic ascites. 8. Multinodular goiter. Aortic Atherosclerosis (ICD10-I70.0). Electronically Signed   By: Almira Bar M.D.   On: 03/27/2023 02:55   CT HEAD WO CONTRAST ( ) Result Date: 03/27/2023 CLINICAL DATA:  Fall at home EXAM: CT HEAD WITHOUT CONTRAST CT CERVICAL SPINE WITHOUT CONTRAST TECHNIQUE: Multidetector CT imaging of the head and cervical spine was performed following the standard protocol without intravenous contrast. Multiplanar CT image reconstructions of the cervical spine were also generated. RADIATION DOSE REDUCTION: This exam was performed according to the departmental dose-optimization program which includes automated exposure control, adjustment of the mA and/or kV according to patient size and/or use of iterative reconstruction technique. COMPARISON:  12/22/2021 CT head and cervical spine FINDINGS: CT HEAD FINDINGS Brain: No evidence of acute infarct, hemorrhage, mass, mass effect, or  midline shift. No hydrocephalus or extra-axial fluid collection. Age related cerebral atrophy. Periventricular white matter changes, likely the sequela of chronic small vessel ischemic disease. Vascular: No hyperdense vessel. Skull: Negative for fracture or focal lesion. Sinuses/Orbits: No acute finding. Other: The mastoid air cells are well aerated. CT CERVICAL SPINE FINDINGS Alignment: No traumatic listhesis. Unchanged trace anterolisthesis of C3 on C4 and C7 on T1, which appear facet mediated. Skull base and vertebrae: No acute  fracture or suspicious osseous lesion. Osseous fusion at the posterior aspect of C3 and C4 and across the disc space at C5-C6. Soft tissues and spinal canal: No prevertebral fluid or swelling. No visible canal hematoma. Disc levels: Degenerative changes in the cervical spine.No high-grade spinal canal stenosis. Upper chest: For findings in the thorax, please see same day CT chest. IMPRESSION: 1. No acute intracranial process. 2. No acute fracture or traumatic listhesis in the cervical spine. Electronically Signed   By: Wiliam Ke M.D.   On: 03/27/2023 02:18   CT CERVICAL SPINE WO CONTRAST Result Date: 03/27/2023 CLINICAL DATA:  Fall at home EXAM: CT HEAD WITHOUT CONTRAST CT CERVICAL SPINE WITHOUT CONTRAST TECHNIQUE: Multidetector CT imaging of the head and cervical spine was performed following the standard protocol without intravenous contrast. Multiplanar CT image reconstructions of the cervical spine were also generated. RADIATION DOSE REDUCTION: This exam was performed according to the departmental dose-optimization program which includes automated exposure control, adjustment of the mA and/or kV according to patient size and/or use of iterative reconstruction technique. COMPARISON:  12/22/2021 CT head and cervical spine FINDINGS: CT HEAD FINDINGS Brain: No evidence of acute infarct, hemorrhage, mass, mass effect, or midline shift. No hydrocephalus or extra-axial fluid  collection. Age related cerebral atrophy. Periventricular white matter changes, likely the sequela of chronic small vessel ischemic disease. Vascular: No hyperdense vessel. Skull: Negative for fracture or focal lesion. Sinuses/Orbits: No acute finding. Other: The mastoid air cells are well aerated. CT CERVICAL SPINE FINDINGS Alignment: No traumatic listhesis. Unchanged trace anterolisthesis of C3 on C4 and C7 on T1, which appear facet mediated. Skull base and vertebrae: No acute fracture or suspicious osseous lesion. Osseous fusion at the posterior aspect of C3 and C4 and across the disc space at C5-C6. Soft tissues and spinal canal: No prevertebral fluid or swelling. No visible canal hematoma. Disc levels: Degenerative changes in the cervical spine.No high-grade spinal canal stenosis. Upper chest: For findings in the thorax, please see same day CT chest. IMPRESSION: 1. No acute intracranial process. 2. No acute fracture or traumatic listhesis in the cervical spine. Electronically Signed   By: Wiliam Ke M.D.   On: 03/27/2023 02:18   DG Foot Complete Left Result Date: 03/27/2023 CLINICAL DATA:  Status post fall with a sore on the anterior surface of the left foot. EXAM: LEFT FOOT - COMPLETE 3+ VIEW COMPARISON:  None Available. FINDINGS: There is no evidence of an acute fracture or dislocation. Moderate to marked severity degenerative changes are seen involving the first metatarsophalangeal joint. No areas of acute cortical erosion are identified. Mild to moderate severity dorsal soft tissue swelling is noted. IMPRESSION: 1. Dorsal soft tissue swelling without evidence of acute fracture or dislocation. 2. Moderate to marked severity degenerative changes involving the first metatarsophalangeal joint. Electronically Signed   By: Aram Candela M.D.   On: 03/27/2023 01:43   DG Chest Portable 1 View Result Date: 03/27/2023 CLINICAL DATA:  Shortness of breath and dementia, status post fall. EXAM: PORTABLE  CHEST 1 VIEW COMPARISON:  December 12, 2022 FINDINGS: The cardiac silhouette is mildly enlarged and unchanged in size. Mild to moderate severity areas of atelectasis and/or infiltrate are suspected within the bilateral lung bases. A small to moderate sized, partially layering right pleural effusion is noted. No pneumothorax is identified. The visualized skeletal structures are unremarkable. IMPRESSION: 1. Mild to moderate severity bibasilar atelectasis and/or infiltrate. 2. Small to moderate sized, partially layering right pleural effusion. Electronically Signed   By: Waylan Rocher  [place] M.D.   On: 03/27/2023 01:40   DG Hip Unilat W or Wo Pelvis 2-3 Views Left Result Date: 03/27/2023 CLINICAL DATA:  Status post fall. EXAM: DG HIP (WITH OR WITHOUT PELVIS) 2-3V LEFT COMPARISON:  Aug 04, 2021 FINDINGS: There is no evidence of an acute hip fracture or dislocation. Marked severity degenerative changes are seen in the form of joint space narrowing and acetabular sclerosis. IMPRESSION: Marked severity degenerative changes without evidence of an acute fracture or dislocation. Electronically Signed   By: Aram Candela M.D.   On: 03/27/2023 01:39    Pertinent labs & imaging results that were available during my care of the patient were reviewed by me and considered in my medical decision making (see MDM for details).  Medications Ordered in ED Medications  furosemide (LASIX) injection 40 mg (40 mg Intravenous Given 03/27/23 0331)                                                                                                                                     Procedures Procedures  (including critical care time)  Medical Decision Making / ED Course   This patient presents to the ED for concern of fall, this involves an extensive number of treatment options, and is a complaint that carries with it a high risk of complications and morbidity.  The differential diagnosis includes fracture, contusion,  hematoma, ligamentous injury, closed head injury, ICH, laceration, intrathoracic injury, intra-abdominal injury  MDM: Patient seen emergency room for evaluation of a fall.  Physical exam with tenderness over the dorsum of the foot on the left at the site of a wound that is already being addressed by outpatient wound care.  No external shortening or rotation seen.  No external signs of trauma seen on the chest abdomen or pelvis.  Laboratory evaluation with a leukopenia of 2.9, hemoglobin 11.8, platelet count 89 which is near patient's baseline.  BUN 49, creatinine 1.35 also near patient's baseline.  BNP 1694.  Chest x-ray with pleural effusions.  Trauma imaging including CT head, C-spine, foot chest and hip x-rays reassuringly negative for acute traumatic injury.  CT chest showing bilateral pleural effusions slightly greater than previous.  He is in no respiratory distress and is not hypoxic here in the emergency department.  Additional dose of IV diuresis begun.  Outpatient referral placed to pulmonology for possible outpatient thoracentesis.  Currently does not meet inpatient criteria for admission and will be discharged with outpatient follow-up.   Additional history obtained: -Additional history obtained from son -External records from outside source obtained and reviewed including: Chart review including previous notes, labs, imaging, consultation notes   Lab Tests: -I ordered, reviewed, and interpreted labs.   The pertinent results include:   Labs Reviewed  COMPREHENSIVE METABOLIC PANEL - Abnormal; Notable for the following components:      Result Value   Glucose, Bld 109 (*)    BUN 49 (*)  Creatinine, Ser 1.35 (*)    GFR, Estimated 47 (*)    All other components within normal limits  CBC WITH DIFFERENTIAL/PLATELET - Abnormal; Notable for the following components:   WBC 2.9 (*)    RBC 3.54 (*)    Hemoglobin 11.8 (*)    HCT 36.6 (*)    MCV 103.4 (*)    Platelets 89 (*)    All other  components within normal limits  BRAIN NATRIURETIC PEPTIDE - Abnormal; Notable for the following components:   B Natriuretic Peptide 1,694.3 (*)    All other components within normal limits     Imaging Studies ordered: I ordered imaging studies including chest x-ray, CT head, CT C-spine, foot hip x-rays, CT chest I independently visualized and interpreted imaging. I agree with the radiologist interpretation   Medicines ordered and prescription drug management: Meds ordered this encounter  Medications   furosemide (LASIX) injection 40 mg    -I have reviewed the patients home medicines and have made adjustments as needed  Critical interventions none   Cardiac Monitoring: The patient was maintained on a cardiac monitor.  I personally viewed and interpreted the cardiac monitored which showed an underlying rhythm of: NSR  Social Determinants of Health:  Factors impacting patients care include: none   Reevaluation: After the interventions noted above, I reevaluated the patient and found that they have :improved  Co morbidities that complicate the patient evaluation  Past Medical History:  Diagnosis Date   Lumbar spondylosis 11/10/2016   Testosterone deficiency       Dispostion: I considered admission for this patient, but he currently does not meet inpatient criteria for admission and will be discharged with outpatient follow-up     Final Clinical Impression(s) / ED Diagnoses Final diagnoses:  Pleural effusion  Fall, initial encounter     @PCDICTATION @    Travis Mastel, Wyn Forster, MD 03/27/23 [phone]

## 2023-03-29 DIAGNOSIS — M159 Polyosteoarthritis, unspecified: Secondary | ICD-10-CM | POA: Diagnosis not present

## 2023-03-29 DIAGNOSIS — F411 Generalized anxiety disorder: Secondary | ICD-10-CM | POA: Diagnosis not present

## 2023-03-29 DIAGNOSIS — D638 Anemia in other chronic diseases classified elsewhere: Secondary | ICD-10-CM | POA: Diagnosis not present

## 2023-03-29 DIAGNOSIS — I5022 Chronic systolic (congestive) heart failure: Secondary | ICD-10-CM | POA: Diagnosis not present

## 2023-03-29 DIAGNOSIS — R54 Age-related physical debility: Secondary | ICD-10-CM | POA: Diagnosis not present

## 2023-03-29 DIAGNOSIS — I110 Hypertensive heart disease with heart failure: Secondary | ICD-10-CM | POA: Diagnosis not present

## 2023-03-29 DIAGNOSIS — M47816 Spondylosis without myelopathy or radiculopathy, lumbar region: Secondary | ICD-10-CM | POA: Diagnosis not present

## 2023-04-21 NOTE — Progress Notes (Unsigned)
Cardiology Office Note:  .   Date:  04/23/2023  ID:  John Rose, DOB 08-25-1922, MRN [id number] PCP: Patient, No Pcp Per  John Rose Providers Cardiologist:  John Constant, MD    Patient Profile: .      PMH Dementia CKD Stage 3b Pleural effusion PACs HFpEF Hyperlipidemia Aortic atherosclerosis Protein calorie malnutrition Hard of hearing  Referred to cardiology for irregular heartbeats and seen by Dr. Raynelle Rose 12/14/2021.  On 08/04/2021 he was found on the floor by his son and admitted to the hospital.  Was found to have rhabdomyolysis.  He had been given Lasix 40 mg for 5 days for leg swelling.  Following discharge he went to a nursing center for 30 days and at the time of follow-up visit he was living with his son with 8 hours of care provided by a nurse.  He did not have a history of dementia but had been diagnosed in the past with metabolic encephalopathy and communication disorder prior to the fall.  He was noted to have rare PACs on the monitor.  Last cardiology clinic visit was 09/07/2022 with Dr. Raynelle Rose.  He reported minimal activity, spending most of his time in a wheelchair.  His lower extremity edema had worsened despite diuretics.  He had new dentures.  Eating 2 meals daily.  The family felt he was not walking as much as recommended and had more DOE.  He was felt to be volume overloaded.  High risk of UTI so SGLT2 was not added.  Lasix was increased to 40 mg daily.  Admission 12/2022 for acute on chronic combined HFrEF with newly reduced EF.  Echo during admission showed EF 20 to 25%, moderate LVH, G2 DD, mildly reduced RV function, mild MR, severe TR.  No plan to pursue ischemia evaluation due to patient's age, and dementia per family.  GDMT limited by hypotension.       History of Present Illness: John Rose   John Rose is a very pleasant 89 y.o. male who is here today with his son for follow-up. He is in a wheelchair.  He and his son report no  concerns since hospitalization December 2024. He reports no significant changes in weight and sleeps comfortably on one pillow at night, with no nocturnal dyspnea. He is sleeping in a hospital bed so his feet and head are somewhat elevated.  There have been no recent changes and hide of elevation. He experiences shortness of breath with exertion, such as walking or going to bed. He wears a belt around his waist for a hernia.  He denies chest pain, edema, palpitations, presyncope or syncope. Says he does not get lightheaded when he stands up at home.  He sometimes uses a walker.  His family has encouraged him to push the wheelchair with his arms but he prefers to scoot it with his feet.  They are concerned that he is not moving often enough. He is also concerned about fluid intake since pt prefers sweet drinks like Crystal Light and orange juice. He reports good appetite and diet is reportedly healthy, with avoidance of processed foods and salt, and inclusion of fruits and vegetables.  Discussed the use of AI scribe software for clinical note transcription with the patient, who gave verbal consent to proceed.   ROS: See HPI       Studies Reviewed: .        Risk Assessment/Calculations:             Physical Exam:  VS:  BP 121/76   Pulse 68   Ht 5\' 8"  (1.727 m)   Wt 152 lb 12.8 oz (69.3 kg)   SpO2 97%   BMI 23.23 kg/m    Wt Readings from Last 3 Encounters:  04/23/23 152 lb 12.8 oz (69.3 kg)  12/18/22 155 lb 3.3 oz (70.4 kg)  09/07/22 161 lb (73 kg)    GEN: Elderly in no acute distress NECK: No JVD; No carotid bruits CARDIAC: RRR, no murmurs, rubs, gallops RESPIRATORY:  Diminished breath sounds bilaterally without rales, wheezing or rhonchi  ABDOMEN: Soft, non-tender, non-distended EXTREMITIES:  No edema; No deformity     ASSESSMENT AND PLAN: .    Chronic combined CHF: Echo 12/11/22 with EF 20-25%, global HK, moderate LVH, moderately elevated PASP, RVSP 53.7 mmHg.  He is on furosemide  80 mg daily, metoprolol 25 mg daily, and potassium chloride 20 mEq daily.  Is tolerating these medications without any concerning side effects.  Weight is stable.  He reports shortness of breath with ambulation but no significant change recently.  He denies orthopnea, PND, edema. Prefers drinking juice and Crystal light over plain water. Encouraged good hydration with water and limit fluids to 2L daily. No obvious evidence of volume overload on exam. BP is stable. Not felt to be a candidate for SGLT2i due to frequent urinary infection. No indication for further up titration of GDMT for CHF.   CKD Stage 3b: History of hyperkalemia in Sept/Oct 2024 for which potassium supplement was reduced. He does not see a nephrologist creatinine 1.35, GFR 47, potassium 4.4 on labs completed 03/27/23.  We will recheck today to ensure stability.  Pleural effusion: History of bilateral pleural effusions identified on CT 12/2022 during hospitalization.  They were again noted on CT 03/27/2023. No increased WOB.  He has SOB with ambulation but no significant dyspnea, and no orthopnea or PND. Breath sounds are diminished bilaterally but I do not appreciate evidence of volume overload on exam. Encouraged walking and deep breathing throughout the day and continue Lasix 80 mg daily.   Aortic atherosclerosis: Aortic atherosclerosis noted on prior CT.  He denies chest pain, dyspnea, or other symptoms concerning for angina. No indication for further ischemic evaluation at this time. Focus on secondary prevention including heart healthy mostly plant based diet avoiding saturated fat, processed foods, simple carbohydrates, and sugar along with aiming for at least 150 minutes of moderate intensity exercise each week.        Dispo: 3 months with Dr. Izora Rose or APP  Signed, John Bridegroom, NP-C

## 2023-04-23 ENCOUNTER — Encounter: Payer: Self-pay | Admitting: Nurse Practitioner

## 2023-04-23 ENCOUNTER — Ambulatory Visit: Payer: Medicare Other | Attending: Nurse Practitioner | Admitting: Nurse Practitioner

## 2023-04-23 VITALS — BP 121/76 | HR 68 | Ht 68.0 in | Wt 152.8 lb

## 2023-04-23 DIAGNOSIS — I5042 Chronic combined systolic (congestive) and diastolic (congestive) heart failure: Secondary | ICD-10-CM | POA: Diagnosis not present

## 2023-04-23 DIAGNOSIS — N1832 Chronic kidney disease, stage 3b: Secondary | ICD-10-CM

## 2023-04-23 DIAGNOSIS — I700 Atherosclerosis of aorta: Secondary | ICD-10-CM | POA: Diagnosis not present

## 2023-04-23 DIAGNOSIS — J90 Pleural effusion, not elsewhere classified: Secondary | ICD-10-CM

## 2023-04-23 NOTE — Patient Instructions (Signed)
Medication Instructions:   Your physician recommends that you continue on your current medications as directed. Please refer to the Current Medication list given to you today.   *If you need a refill on your cardiac medications before your next appointment, please call your pharmacy*   Lab Work:  TODAY!!!! BMET  If you have labs (blood work) drawn today and your tests are completely normal, you will receive your results only by: MyChart Message (if you have MyChart) OR A paper copy in the mail If you have any lab test that is abnormal or we need to change your treatment, we will call you to review the results.   Testing/Procedures:  None ordered.   Follow-Up: At [facility], you and your health needs are our priority.  As part of our continuing mission to provide you with exceptional heart care, we have created designated Provider Care Teams.  These Care Teams include your primary Cardiologist (physician) and Advanced Practice Providers (APPs -  Physician Assistants and Nurse Practitioners) who all work together to provide you with the care you need, when you need it.  We recommend signing up for the patient portal called "MyChart".  Sign up information is provided on this After Visit Summary.  MyChart is used to connect with patients for Virtual Visits (Telemedicine).  Patients are able to view lab/test results, encounter notes, upcoming appointments, etc.  Non-urgent messages can be sent to your provider as well.   To learn more about what you can do with MyChart, go to [email or web address].    Your next appointment:   3 month(s)  Provider:   Robin Searing, NP         Other Instructions    1st Floor: - Lobby - Registration  - Pharmacy  - Lab - Cafe  2nd Floor: - PV Lab - Diagnostic Testing (echo, CT, nuclear med)  3rd Floor: - Vacant  4th Floor: - TCTS (cardiothoracic surgery) - AFib Clinic - Structural Heart Clinic - Vascular Surgery  - Vascular  Ultrasound  5th Floor: - [facility] Card[facility] (general and EP) - Clinical Pharmacy for coumadin, hypertension, lipid, weight-loss medications, and med management appointments    Valet parking services will be available as well.

## 2023-04-24 ENCOUNTER — Telehealth: Payer: Self-pay | Admitting: *Deleted

## 2023-04-24 ENCOUNTER — Other Ambulatory Visit: Payer: Self-pay | Admitting: *Deleted

## 2023-04-24 DIAGNOSIS — Z79899 Other long term (current) drug therapy: Secondary | ICD-10-CM

## 2023-04-24 DIAGNOSIS — N1832 Chronic kidney disease, stage 3b: Secondary | ICD-10-CM

## 2023-04-24 LAB — BASIC METABOLIC PANEL
BUN/Creatinine Ratio: 27 — ABNORMAL HIGH (ref 10–24)
BUN: 47 mg/dL — ABNORMAL HIGH (ref 10–36)
CO2: 26 mmol/L (ref 20–29)
Calcium: 9.4 mg/dL (ref 8.6–10.2)
Chloride: 104 mmol/L (ref 96–106)
Creatinine, Ser: 1.75 mg/dL — ABNORMAL HIGH (ref 0.76–1.27)
Glucose: 76 mg/dL (ref 70–99)
Potassium: 4.9 mmol/L (ref 3.5–5.2)
Sodium: 143 mmol/L (ref 134–144)
eGFR: 35 mL/min/{1.73_m2} — ABNORMAL LOW (ref >59)

## 2023-04-24 MED ORDER — FUROSEMIDE 80 MG PO TABS: ORAL_TABLET | ORAL | Status: DC

## 2023-04-24 NOTE — Telephone Encounter (Signed)
-----   Message from Levi Aland sent at 04/24/2023  8:41 AM EST ----- His creatinine is worse than in December but not significantly elevated above what appears to be his baseline of 1.5. His potassium is normal. I would recommend that he reduce his Lasix to 40 mg 3 days per week (Monday, Wednesday, Friday) for example. Monitor daily weight and if weight gain is > 2 lbs in 24 hours or > 5 lbs in one week, he should take 80 mg of Lasix on that day, regardless of what day of the week it is. Would recommend repeat BMET in 2-3 weeks. If he happens to have an appointment with PCP within that time frame, he could have lab work at their office.

## 2023-04-24 NOTE — Telephone Encounter (Signed)
S/w pt's son per [facility]) is aware of lasix changes. Take one half (0.5) tablet by mouth ( 40 mg) three times weekly. If wt gain of 2 lbs in 24 hour or 5 lbs in one week take ( 80 mg) by mouth that day Medication list updated.  Bmet ordered and released.  Will see if [facility] or PCP can draw.

## 2023-05-17 ENCOUNTER — Inpatient Hospital Stay (HOSPITAL_COMMUNITY)
Admission: EM | Admit: 2023-05-17 | Discharge: 2023-05-22 | DRG: 291 | Disposition: A | Discharge status: Skilled Nursing Facility | Payer: Medicare Other | Attending: Internal Medicine | Admitting: Internal Medicine

## 2023-05-17 ENCOUNTER — Other Ambulatory Visit: Payer: Self-pay

## 2023-05-17 ENCOUNTER — Ambulatory Visit: Payer: Medicare Other | Admitting: Nurse Practitioner

## 2023-05-17 ENCOUNTER — Encounter (HOSPITAL_COMMUNITY): Payer: Self-pay | Admitting: *Deleted

## 2023-05-17 ENCOUNTER — Emergency Department (HOSPITAL_COMMUNITY): Payer: Medicare Other

## 2023-05-17 ENCOUNTER — Telehealth: Payer: Self-pay | Admitting: Internal Medicine

## 2023-05-17 DIAGNOSIS — S96901A Unspecified injury of unspecified muscle and tendon at ankle and foot level, right foot, initial encounter: Secondary | ICD-10-CM | POA: Insufficient documentation

## 2023-05-17 DIAGNOSIS — I952 Hypotension due to drugs: Secondary | ICD-10-CM | POA: Diagnosis not present

## 2023-05-17 DIAGNOSIS — N400 Enlarged prostate without lower urinary tract symptoms: Secondary | ICD-10-CM | POA: Diagnosis present

## 2023-05-17 DIAGNOSIS — Z66 Do not resuscitate: Secondary | ICD-10-CM

## 2023-05-17 DIAGNOSIS — D7589 Other specified diseases of blood and blood-forming organs: Secondary | ICD-10-CM | POA: Diagnosis present

## 2023-05-17 DIAGNOSIS — I130 Hypertensive heart and chronic kidney disease with heart failure and stage 1 through stage 4 chronic kidney disease, or unspecified chronic kidney disease: Principal | ICD-10-CM | POA: Diagnosis present

## 2023-05-17 DIAGNOSIS — Z79899 Other long term (current) drug therapy: Secondary | ICD-10-CM | POA: Diagnosis not present

## 2023-05-17 DIAGNOSIS — T502X5A Adverse effect of carbonic-anhydrase inhibitors, benzothiadiazides and other diuretics, initial encounter: Secondary | ICD-10-CM | POA: Diagnosis not present

## 2023-05-17 DIAGNOSIS — I5023 Acute on chronic systolic (congestive) heart failure: Secondary | ICD-10-CM | POA: Diagnosis present

## 2023-05-17 DIAGNOSIS — S91309A Unspecified open wound, unspecified foot, initial encounter: Secondary | ICD-10-CM

## 2023-05-17 DIAGNOSIS — S91301A Unspecified open wound, right foot, initial encounter: Secondary | ICD-10-CM | POA: Diagnosis not present

## 2023-05-17 DIAGNOSIS — R0602 Shortness of breath: Secondary | ICD-10-CM | POA: Diagnosis present

## 2023-05-17 DIAGNOSIS — J90 Pleural effusion, not elsewhere classified: Secondary | ICD-10-CM | POA: Diagnosis not present

## 2023-05-17 DIAGNOSIS — Z8249 Family history of ischemic heart disease and other diseases of the circulatory system: Secondary | ICD-10-CM | POA: Diagnosis not present

## 2023-05-17 DIAGNOSIS — I2510 Atherosclerotic heart disease of native coronary artery without angina pectoris: Secondary | ICD-10-CM | POA: Diagnosis present

## 2023-05-17 DIAGNOSIS — E039 Hypothyroidism, unspecified: Secondary | ICD-10-CM | POA: Diagnosis present

## 2023-05-17 DIAGNOSIS — M109 Gout, unspecified: Secondary | ICD-10-CM | POA: Diagnosis present

## 2023-05-17 DIAGNOSIS — N1832 Chronic kidney disease, stage 3b: Secondary | ICD-10-CM | POA: Diagnosis present

## 2023-05-17 DIAGNOSIS — R7989 Other specified abnormal findings of blood chemistry: Secondary | ICD-10-CM | POA: Diagnosis not present

## 2023-05-17 DIAGNOSIS — R5381 Other malaise: Secondary | ICD-10-CM | POA: Diagnosis present

## 2023-05-17 DIAGNOSIS — L97529 Non-pressure chronic ulcer of other part of left foot with unspecified severity: Secondary | ICD-10-CM | POA: Diagnosis present

## 2023-05-17 DIAGNOSIS — N170 Acute kidney failure with tubular necrosis: Secondary | ICD-10-CM | POA: Diagnosis not present

## 2023-05-17 DIAGNOSIS — J9811 Atelectasis: Secondary | ICD-10-CM | POA: Diagnosis present

## 2023-05-17 DIAGNOSIS — E875 Hyperkalemia: Secondary | ICD-10-CM

## 2023-05-17 DIAGNOSIS — F0390 Unspecified dementia without behavioral disturbance: Secondary | ICD-10-CM | POA: Diagnosis present

## 2023-05-17 DIAGNOSIS — I509 Heart failure, unspecified: Principal | ICD-10-CM

## 2023-05-17 DIAGNOSIS — I5A Non-ischemic myocardial injury (non-traumatic): Secondary | ICD-10-CM | POA: Diagnosis present

## 2023-05-17 LAB — CBC WITH DIFFERENTIAL/PLATELET
Abs Immature Granulocytes: 0.01 10*3/uL (ref 0.00–0.07)
Basophils Absolute: 0 10*3/uL (ref 0.0–0.1)
Basophils Relative: 1 %
Eosinophils Absolute: 0 10*3/uL (ref 0.0–0.5)
Eosinophils Relative: 1 %
HCT: 43 % (ref 39.0–52.0)
Hemoglobin: 13 g/dL (ref 13.0–17.0)
Immature Granulocytes: 0 %
Lymphocytes Relative: 22 %
Lymphs Abs: 0.7 10*3/uL (ref 0.7–4.0)
MCH: 32.2 pg (ref 26.0–34.0)
MCHC: 30.2 g/dL (ref 30.0–36.0)
MCV: 106.4 fL — ABNORMAL HIGH (ref 80.0–100.0)
Monocytes Absolute: 0.4 10*3/uL (ref 0.1–1.0)
Monocytes Relative: 12 %
Neutro Abs: 2 10*3/uL (ref 1.7–7.7)
Neutrophils Relative %: 64 %
Platelets: 78 10*3/uL — ABNORMAL LOW (ref 150–400)
RBC: 4.04 MIL/uL — ABNORMAL LOW (ref 4.22–5.81)
RDW: 15 % (ref 11.5–15.5)
WBC: 3 10*3/uL — ABNORMAL LOW (ref 4.0–10.5)
nRBC: 0 % (ref 0.0–0.2)

## 2023-05-17 LAB — COMPREHENSIVE METABOLIC PANEL
ALT: 25 U/L (ref 0–44)
AST: 35 U/L (ref 15–41)
Albumin: 4 g/dL (ref 3.5–5.0)
Alkaline Phosphatase: 72 U/L (ref 38–126)
Anion gap: 7 (ref 5–15)
BUN: 44 mg/dL — ABNORMAL HIGH (ref 8–23)
CO2: 24 mmol/L (ref 22–32)
Calcium: 9.4 mg/dL (ref 8.9–10.3)
Chloride: 104 mmol/L (ref 98–111)
Creatinine, Ser: 1.69 mg/dL — ABNORMAL HIGH (ref 0.61–1.24)
GFR, Estimated: 36 mL/min — ABNORMAL LOW (ref >60)
Glucose, Bld: 67 mg/dL — ABNORMAL LOW (ref 70–99)
Potassium: 5.2 mmol/L — ABNORMAL HIGH (ref 3.5–5.1)
Sodium: 135 mmol/L (ref 135–145)
Total Bilirubin: 0.8 mg/dL (ref 0.0–1.2)
Total Protein: 7.3 g/dL (ref 6.5–8.1)

## 2023-05-17 LAB — TROPONIN I (HIGH SENSITIVITY)
Troponin I (High Sensitivity): 108 ng/L (ref <18)
Troponin I (High Sensitivity): 88 ng/L — ABNORMAL HIGH (ref <18)

## 2023-05-17 LAB — BRAIN NATRIURETIC PEPTIDE: B Natriuretic Peptide: 2307.1 pg/mL — ABNORMAL HIGH (ref 0.0–100.0)

## 2023-05-17 MED ORDER — MELATONIN 3 MG PO TABS: 6.0000 mg | ORAL_TABLET | Freq: Every evening | ORAL | Status: DC | PRN

## 2023-05-17 MED ORDER — SODIUM CHLORIDE 0.9% FLUSH
3.0000 mL | Freq: Two times a day (BID) | INTRAVENOUS | Status: DC
  Administered 2023-05-17 – 2023-05-22 (×9): 3 mL via INTRAVENOUS

## 2023-05-17 MED ORDER — ACETAMINOPHEN 500 MG PO TABS: 1000.0000 mg | ORAL_TABLET | Freq: Four times a day (QID) | ORAL | Status: DC | PRN

## 2023-05-17 MED ORDER — ALBUTEROL SULFATE (2.5 MG/3ML) 0.083% IN NEBU: 2.5000 mg | INHALATION_SOLUTION | RESPIRATORY_TRACT | Status: DC | PRN

## 2023-05-17 MED ORDER — GABAPENTIN 300 MG PO CAPS
300.0000 mg | ORAL_CAPSULE | Freq: Two times a day (BID) | ORAL | Status: DC
  Administered 2023-05-17 – 2023-05-18 (×3): 300 mg via ORAL
  Filled 2023-05-17 (×3): qty 1

## 2023-05-17 MED ORDER — POLYETHYLENE GLYCOL 3350 17 G PO PACK: 17.0000 g | PACK | Freq: Every day | ORAL | Status: DC | PRN

## 2023-05-17 MED ORDER — FUROSEMIDE 10 MG/ML IJ SOLN
80.0000 mg | Freq: Every day | INTRAMUSCULAR | Status: DC
  Administered 2023-05-18: 80 mg via INTRAVENOUS
  Filled 2023-05-17: qty 8

## 2023-05-17 MED ORDER — ONDANSETRON HCL 4 MG/2ML IJ SOLN: 4.0000 mg | Freq: Four times a day (QID) | INTRAMUSCULAR | Status: DC | PRN

## 2023-05-17 MED ORDER — TAMSULOSIN HCL 0.4 MG PO CAPS
0.4000 mg | ORAL_CAPSULE | Freq: Every day | ORAL | Status: DC
  Administered 2023-05-18 – 2023-05-22 (×4): 0.4 mg via ORAL
  Filled 2023-05-17 (×4): qty 1

## 2023-05-17 MED ORDER — FUROSEMIDE 10 MG/ML IJ SOLN
80.0000 mg | Freq: Once | INTRAMUSCULAR | Status: AC
  Administered 2023-05-17: 80 mg via INTRAVENOUS
  Filled 2023-05-17: qty 8

## 2023-05-17 MED ORDER — MELATONIN 3 MG PO TABS
6.0000 mg | ORAL_TABLET | Freq: Every day | ORAL | Status: DC
  Administered 2023-05-17 – 2023-05-21 (×5): 6 mg via ORAL
  Filled 2023-05-17 (×5): qty 2

## 2023-05-17 MED ORDER — METOPROLOL SUCCINATE ER 25 MG PO TB24
25.0000 mg | ORAL_TABLET | Freq: Every day | ORAL | Status: DC
  Administered 2023-05-18: 25 mg via ORAL
  Filled 2023-05-17: qty 1

## 2023-05-17 MED ORDER — QUETIAPINE FUMARATE 25 MG PO TABS
25.0000 mg | ORAL_TABLET | Freq: Every day | ORAL | Status: DC
  Administered 2023-05-17 – 2023-05-18 (×2): 25 mg via ORAL
  Filled 2023-05-17 (×2): qty 1

## 2023-05-17 MED ORDER — ENOXAPARIN SODIUM 30 MG/0.3ML IJ SOSY
30.0000 mg | PREFILLED_SYRINGE | INTRAMUSCULAR | Status: DC
Start: 2023-05-18 — End: 2023-05-22
  Administered 2023-05-18 – 2023-05-22 (×5): 30 mg via SUBCUTANEOUS
  Filled 2023-05-17 (×5): qty 0.3

## 2023-05-17 NOTE — ED Triage Notes (Addendum)
BIB family from home for SOB, DOE, weight gain, edema. Ongoing over the last few weeks. No improvement despite recent lasix med changes. Seen by mobile PCP [facility] RN this am and instructed to go to ED. Denies fever or CP. Alert, NAD, calm.  Mentions feet pain related to edema and neuropathy.

## 2023-05-17 NOTE — Telephone Encounter (Signed)
Pt c/o swelling/edema: STAT if pt has developed SOB within 24 hours  If swelling, where is the swelling located? Legs/feet,abdomen 2+  How much weight have you gained and in what time span? 16 pounds since 04/23/23  Have you gained 2 pounds in a day or 5 pounds in a week?    Do you have a log of your daily weights (if so, list)?   Are you currently taking a fluid pill? yes  Are you currently SOB? Yes  Have you traveled recently in a car or plane for an extended period of time? no

## 2023-05-17 NOTE — H&P (Addendum)
History and Physical    John Rose ZOX:[id number] DOB: 10/27/1922 DOA: 2/13/John Rose  PCP: John Constant, MD   Patient coming from: Home   Chief Complaint:  Chief Complaint  Patient presents with   Shortness of Breath    HPI: History limited due to patient's dementia, obtained by chart review/ED history  John Rose is a 89 y.o. male with hx of dementia, heart failure with reduced ejection fraction, CAD 3B, BPH, who presents with progressive dyspnea. On my interview patient is vague about reasons for coming into the reporting recent shortness of breath and "heart problem".   Per ED note:  " He presents with increased shortness of breath and weight gain. History is obtained from the patient and his son. He was seen on January 20 at the CHF clinic. At that time he was taking Lasix 80 mg a day. However his labs came back with an increased creatinine. They decrease his Lasix to 40 mg 3 times a week. He has been on the 2 mg dose. However over the last week he has had some increased shortness of breath and fatigue on exertion. He denies any associated chest discomfort. He has had some increased leg swelling. The son's been giving him his 80 mg dose for the last 2 days but he has not noticed any improvement in symptoms. He does not have any increased urination. No fevers. No cough or cold symptoms. "    Review of Systems:  ROS complete and negative except as marked above   No Known Allergies  Prior to Admission medications   Medication Sig Start Date End Date Taking? Authorizing Provider  acetaminophen (TYLENOL) 650 MG CR tablet Take 650 mg by mouth every 8 (eight) hours as needed for pain.    [provider]  diclofenac Sodium (VOLTAREN) 1 % GEL Apply 2 g topically every 8 (eight) hours. 10/17/22   [provider]  furosemide (LASIX) 80 MG tablet Take one half (0.5) tablet by mouth ( 40 mg) three times weekly. If wt gain of 2 lbs in 24 hour or 5 lbs in one week  take ( 80 mg) by mouth that day. 04/24/23   Rose, John George, NP  gabapentin (NEURONTIN) 300 MG capsule Take 300 mg by mouth 2 (two) times daily. 11/19/21   [provider]  hydrocortisone (ANUSOL-HC) 2.5 % rectal cream Apply topically as needed for hemorrhoids or anal itching. 07/18/22   [provider]  metoprolol succinate (TOPROL-XL) 25 MG 24 hr tablet Take 1 tablet (25 mg total) by mouth daily. Take with or immediately following a meal. 12/20/22   Rose, John K, MD  Multiple Vitamin (MULTIVITAMIN ADULT PO) Take 1 tablet by mouth daily.    [provider]  potassium chloride SA (KLOR-CON M) 20 MEQ tablet Take 20 mEq by mouth daily. 01/21/23   [provider]  QUEtiapine (SEROQUEL) 25 MG tablet Take 1 tablet (25 mg total) by mouth at bedtime. 12/19/22   Rose, John K, MD  senna-docusate (SENOKOT-S) 8.6-50 MG tablet Take 1 tablet by mouth at bedtime as needed for mild constipation. 12/19/22   Rose, John Ruffini, MD  simethicone (MYLICON) 80 MG chewable tablet Chew 80 mg by mouth every 6 (six) hours as needed for flatulence.    [provider]  tamsulosin (FLOMAX) 0.4 MG CAPS capsule Take 0.4 mg by mouth daily.  08/16/16   [provider]    Past Medical History:  Diagnosis Date   Lumbar spondylosis 11/10/2016  Testosterone deficiency     Past Surgical History:  Procedure Laterality Date   NO PAST SURGERIES       reports that he has never smoked. He has never used smokeless tobacco. He reports that he does not drink alcohol and does not use drugs.  Family History  Problem Relation Age of Onset   Hypertension Other      Physical Exam: Vitals:   05/17/23 1652 05/17/23 1925 05/17/23 2055 05/17/23 2200  BP:  (!) 141/86  (!) 132/93  Pulse:  66  73  Resp:  18  16  Temp:   98.4 F (36.9 C)   TempSrc:   Oral   SpO2:  97%  97%  Weight: 73.5 kg       Gen: Awake, alert, chronically ill-appearing CV: Regular, normal S1, S2, no  murmurs  Resp: Normal WOB, diminished in the lower half posteriorly Abd: Flat, normoactive, nontender MSK: Symmetric, hyperpigmentary changes, 1+ pitting edema tapering to the knee Skin: Centimeter skin for more findings, no rashes or lesions to exposed skin  Neuro: Alert and interactive, oriented to person, John Rose (not John Rose), John Rose (not John Rose), and situation Psych: euthymic, appropriate    Data review:   Labs reviewed, notable for:   K5.2 BUN 44, creatinine 1.6 (Baseline 1.3-1.5) Glucose 67 BNP 05/06/2005, up from prior High-sensitivity Trop 118 -> 88 WBC 3, platelets 78 both stable from prior   Micro:  Results for orders placed or performed during the hospital encounter of 12/10/22  Resp panel by RT-PCR (RSV, Flu A&B, Covid) Anterior Nasal Swab     Status: None   Collection Time: 12/10/22  6:26 PM   Specimen: Anterior Nasal Swab  Result Value Ref Range Status   SARS Coronavirus 2 by RT PCR NEGATIVE NEGATIVE Final    Comment: (NOTE) SARS-CoV-2 target nucleic acids are NOT DETECTED.  The SARS-CoV-2 RNA is generally detectable in upper respiratory specimens during the acute phase of infection. The lowest concentration of SARS-CoV-2 viral copies this assay can detect is 138 copies/mL. A negative result does not preclude SARS-Cov-2 infection and should not be used as the sole basis for treatment or other patient management decisions. A negative result may occur with  improper specimen collection/handling, submission of specimen other than nasopharyngeal swab, presence of viral mutation(s) within the areas targeted by this assay, and inadequate number of viral copies(<138 copies/mL). A negative result must be combined with clinical observations, patient history, and epidemiological information. The expected result is Negative.  Fact Sheet for Patients:  BloggerCourse.com  Fact Sheet for Healthcare Providers:   SeriousBroker.it  This test is no t yet approved or cleared by the [place] FDA and  has been authorized for detection and/or diagnosis of SARS-CoV-2 by FDA under an Emergency Use Authorization (EUA). This EUA will remain  in effect (meaning this test can be used) for the duration of the COVID-19 declaration under Section 564(b)(1) of the Act, 21 U.S.C.section 360bbb-3(b)(1), unless the authorization is terminated  or revoked sooner.       Influenza A by PCR NEGATIVE NEGATIVE Final   Influenza B by PCR NEGATIVE NEGATIVE Final    Comment: (NOTE) The Xpert Xpress SARS-CoV-2/FLU/RSV plus assay is intended as an aid in the diagnosis of influenza from Nasopharyngeal swab specimens and should not be used as a sole basis for treatment. Nasal washings and aspirates are unacceptable for Xpert Xpress SARS-CoV-2/FLU/RSV testing.  Fact Sheet for Patients: BloggerCourse.com  Fact Sheet for Healthcare Providers: SeriousBroker.it  This  test is not yet approved or cleared by the [place] and has been authorized for detection and/or diagnosis of SARS-CoV-2 by FDA under an Emergency Use Authorization (EUA). This EUA will remain in effect (meaning this test can be used) for the duration of the COVID-19 declaration under Section 564(b)(1) of the Act, 21 U.S.C. section 360bbb-3(b)(1), unless the authorization is terminated or revoked.     Resp Syncytial Virus by PCR NEGATIVE NEGATIVE Final    Comment: (NOTE) Fact Sheet for Patients: BloggerCourse.com  Fact Sheet for Healthcare Providers: SeriousBroker.it  This test is not yet approved or cleared by the [place] FDA and has been authorized for detection and/or diagnosis of SARS-CoV-2 by FDA under an Emergency Use Authorization (EUA). This EUA will remain in effect (meaning this test can be used) for  the duration of the COVID-19 declaration under Section 564(b)(1) of the Act, 21 U.S.C. section 360bbb-3(b)(1), unless the authorization is terminated or revoked.  Performed at [facility], [address] [place], [place] [address]     Imaging reviewed:  DG Chest 2 View Result Date: 2/13/John Rose CLINICAL DATA:  Shortness of breath EXAM: CHEST - 2 VIEW COMPARISON:  03/27/2023 FINDINGS: Moderate bilateral effusions. Cardiomegaly with vascular congestion. Bibasilar consolidations. IMPRESSION: Cardiomegaly with vascular congestion and moderate bilateral effusions. Bibasilar consolidations may be due to atelectasis or pneumonia. Electronically Signed   By: Jasmine Pang M.D.   On: 02/13/John Rose 18:58    EKG:  Pending review  ED Course:  Treated with Lasix 80 mg IV x 1   Assessment/Plan:  89 y.o. male with hx dementia, heart failure with reduced ejection fraction, CAD 3B, BPH, who presents with progressive dyspnea. Admitted with acute exacerbation of heart failure    Acute exacerbation, HFrEF  Per collateral 1/20 lasix decreased from 80 mg daily -> 40 mg three times per week. Over past week progressive dyspnea and weight gain, edema despite recent increase back to 80 mg over last 2 days PTA. Weight pending here; Last dry weight ~152 lb = 69.3 kg at cards appt 04/23/23. BNP 2307 up from prior, HS trop 118 -> 88. CXR with vascular congestion and moderate bilateral pleural effusion, bibasilar consolidation likely atelectasis in this setting. Etiology of HF exacerbation may be inadequate diuretic considering recent reduction.  - S/p Lasix 80 mg IV x 1 in the ED, tentatively scheduled for 80 mg IV daily for now - On limited GDMT with metoprolol which we will continue.  Not felt to be candidate for ACE/ARB/ARNI renal insufficiency and hypotension, SGLT2 inhibitor due to urinary issues/UTI, and spironolactone due to renal insufficiency/prior hyperkalemia. - Hold off on repeat TTE for  now do not feel will change management - Strict I's and O's, daily weights, Heart failure navigator, TOC consult - Consider routine cardiology consult in the morning to assist with diuretic management, not contacted overnight.  Mild hyperkalemia CKD stage IIIb Baseline creatinine variable 1.3-1.5; elevated to 1.69 on admission.  Associated mild hyperkalemia to 5.2.  Suspect mild cardiorenal component in setting of heart failure. - Diuretic per above - Check PVR - Low potassium diet  Chronic myocardial injury  Hx chronically elevated troponin on past hospitalizations, here HS trop 118 -> 88. No c/o chest pain\ -Suspect related to underlying heart failure, management directed at heart failure above.  Goals of care:  Not addressed thus far, believe he would be candidate for hospice care given his medical comorbidities if he or family is interested. Would continue to address.  Has a DDNR on file.   Chronic medical problems:  Dementia: Continue home Seroquel ?  Chronic pain: Continue gabapentin 300 mg twice daily BPH: Continue home tamsulosin Gout: Continue home colchicine Chronic bicytopenia, macrocytosis without anemia: Leukopenia and thrombocytopenia appear stable; attn to PLT with DVT ppx would hold if < 50. Check B12   Body mass index is 24.63 kg/m.    DVT prophylaxis:  Lovenox Code Status:  DNR/DNI(Do NOT Intubate); Durable DNR on file  Diet:  Diet Orders (From admission, onward)     Start     Ordered   05/17/23 2235  Diet 2 gram sodium Room service appropriate? Yes; Fluid consistency: Thin  Diet effective now       Question Answer Comment  Room service appropriate? Yes   Fluid consistency: Thin      05/17/23 2236           Family Communication:  None   Consults:  None   Admission status:   Inpatient, Telemetry bed  Severity of Illness: The appropriate patient status for this patient is INPATIENT. Inpatient status is judged to be reasonable and necessary in order to  provide the required intensity of service to ensure the patient's safety. The patient's presenting symptoms, physical exam findings, and initial radiographic and laboratory data in the context of their chronic comorbidities is felt to place them at high risk for further clinical deterioration. Furthermore, it is not anticipated that the patient will be medically stable for discharge from the hospital within 2 midnights of admission.   * I certify that at the point of admission it is my clinical judgment that the patient will require inpatient hospital care spanning beyond 2 midnights from the point of admission due to high intensity of service, high risk for further deterioration and high frequency of surveillance required.*   Dolly Rias, MD [facility]  How to contact the [facility] Attending or Consulting provider 7A - 7P or covering provider during after hours 7P -7A, for this patient.  Check the care team in [facility] and look for a) attending/consulting TRH provider listed and b) the [facility] team listed Log into [email or web address] and use [place]'s universal password to access. If you do not have the password, please contact the hospital operator. Locate the [facility] provider you are looking for under [facility] and page to a number that you can be directly reached. If you still have difficulty reaching the provider, please page the [facility] (Director on Call) for the Hospitalists listed on amion for assistance.  2/13/John Rose, 11:47 PM

## 2023-05-17 NOTE — ED Provider Notes (Signed)
[place] EMERGENCY DEPARTMENT AT [facility] Provider Note   CSN: [id number] Arrival date & time: 05/17/23  1537     History  Chief Complaint  Patient presents with   Shortness of Breath    John Rose is a 89 y.o. male.  Patient is a 89 year old male with a history of chronic kidney disease, hypertension, CHF.  Per chart review, his last EF was between 20 to 25%.  He presents with increased shortness of breath and weight gain.  History is obtained from the patient and his son.  He was seen on January 20 at the CHF clinic.  At that time he was taking Lasix 80 mg a day.  However his labs came back with an increased creatinine.  They decrease his Lasix to 40 mg 3 times a week.  He has been on the 2 mg dose.  However over the last week he has had some increased shortness of breath and fatigue on exertion.  He denies any associated chest discomfort.  He has had some increased leg swelling.  The son's been giving him his 80 mg dose for the last 2 days but he has not noticed any improvement in symptoms.  He does not have any increased urination.  No fevers.  No cough or cold symptoms.       Home Medications Prior to Admission medications   Medication Sig Start Date End Date Taking? Authorizing Provider  acetaminophen (TYLENOL) 650 MG CR tablet Take 650 mg by mouth every 8 (eight) hours as needed for pain.    [provider]  diclofenac Sodium (VOLTAREN) 1 % GEL Apply 2 g topically every 8 (eight) hours. 10/17/22   [provider]  furosemide (LASIX) 80 MG tablet Take one half (0.5) tablet by mouth ( 40 mg) three times weekly. If wt gain of 2 lbs in 24 hour or 5 lbs in one week take ( 80 mg) by mouth that day. 04/24/23   Swinyer, Zachary George, NP  gabapentin (NEURONTIN) 300 MG capsule Take 300 mg by mouth 2 (two) times daily. 11/19/21   [provider]  hydrocortisone (ANUSOL-HC) 2.5 % rectal cream Apply topically as needed for hemorrhoids or anal itching.  07/18/22   [provider]  metoprolol succinate (TOPROL-XL) 25 MG 24 hr tablet Take 1 tablet (25 mg total) by mouth daily. Take with or immediately following a meal. 12/20/22   Rai, Ripudeep K, MD  Multiple Vitamin (MULTIVITAMIN ADULT PO) Take 1 tablet by mouth daily.    [provider]  potassium chloride SA (KLOR-CON M) 20 MEQ tablet Take 20 mEq by mouth daily. 01/21/23   [provider]  QUEtiapine (SEROQUEL) 25 MG tablet Take 1 tablet (25 mg total) by mouth at bedtime. 12/19/22   Rai, Ripudeep K, MD  senna-docusate (SENOKOT-S) 8.6-50 MG tablet Take 1 tablet by mouth at bedtime as needed for mild constipation. 12/19/22   Rai, Delene Ruffini, MD  simethicone (MYLICON) 80 MG chewable tablet Chew 80 mg by mouth every 6 (six) hours as needed for flatulence.    [provider]  tamsulosin (FLOMAX) 0.4 MG CAPS capsule Take 0.4 mg by mouth daily.  08/16/16   [provider]      Allergies    Patient has no known allergies.    Review of Systems   Review of Systems  Constitutional:  Positive for fatigue. Negative for chills, diaphoresis and fever.  HENT:  Negative for congestion, rhinorrhea and sneezing.  Eyes: Negative.   Respiratory:  Positive for cough (Mostly dry) and shortness of breath. Negative for chest tightness.   Cardiovascular:  Positive for leg swelling. Negative for chest pain.  Gastrointestinal:  Negative for abdominal pain, blood in stool, diarrhea, nausea and vomiting.  Genitourinary:  Negative for difficulty urinating, flank pain, frequency and hematuria.  Musculoskeletal:  Negative for arthralgias and back pain.  Skin:  Negative for rash.  Neurological:  Negative for dizziness, speech difficulty, weakness, numbness and headaches.    Physical Exam Updated Vital Signs BP (!) 141/86 (BP Location: Left Arm)   Pulse 66   Temp 98.4 F (36.9 C) (Oral)   Resp 18   Wt 73.5 kg   SpO2 97%   BMI 24.63 kg/m  Physical Exam Constitutional:       Appearance: He is well-developed.  HENT:     Head: Normocephalic and atraumatic.  Eyes:     Pupils: Pupils are equal, round, and reactive to light.  Cardiovascular:     Rate and Rhythm: Normal rate and regular rhythm.     Heart sounds: Normal heart sounds.  Pulmonary:     Effort: Pulmonary effort is normal. No respiratory distress.     Breath sounds: Normal breath sounds. No wheezing or rales.  Chest:     Chest wall: No tenderness.  Abdominal:     General: Bowel sounds are normal.     Palpations: Abdomen is soft.     Tenderness: There is no abdominal tenderness. There is no guarding or rebound.  Musculoskeletal:        General: Normal range of motion.     Cervical back: Normal range of motion and neck supple.     Comments: 2-3+ pitting edema to the lower extremities.  He has chronic skin changes and suggestions of chronic poor perfusion in his lower extremities.  He has an ulcerated area to the left foot which is being followed by wound care.  Lymphadenopathy:     Cervical: No cervical adenopathy.  Skin:    General: Skin is warm and dry.     Findings: No rash.  Neurological:     Mental Status: He is alert and oriented to person, place, and time.     ED Results / Procedures / Treatments   Labs (all labs ordered are listed, but only abnormal results are displayed) Labs Reviewed  CBC WITH DIFFERENTIAL/PLATELET - Abnormal; Notable for the following components:      Result Value   WBC 3.0 (*)    RBC 4.04 (*)    MCV 106.4 (*)    Platelets 78 (*)    All other components within normal limits  COMPREHENSIVE METABOLIC PANEL - Abnormal; Notable for the following components:   Potassium 5.2 (*)    Glucose, Bld 67 (*)    BUN 44 (*)    Creatinine, Ser 1.69 (*)    GFR, Estimated 36 (*)    All other components within normal limits  BRAIN NATRIURETIC PEPTIDE - Abnormal; Notable for the following components:   B Natriuretic Peptide 2,307.1 (*)    All other components within  normal limits  TROPONIN I (HIGH SENSITIVITY) - Abnormal; Notable for the following components:   Troponin I (High Sensitivity) 108 (*)    All other components within normal limits  TROPONIN I (HIGH SENSITIVITY) - Abnormal; Notable for the following components:   Troponin I (High Sensitivity) 88 (*)    All other components within normal limits    EKG None  Radiology DG Chest 2 View Result Date: 05/17/2023 CLINICAL DATA:  Shortness of breath EXAM: CHEST - 2 VIEW COMPARISON:  03/27/2023 FINDINGS: Moderate bilateral effusions. Cardiomegaly with vascular congestion. Bibasilar consolidations. IMPRESSION: Cardiomegaly with vascular congestion and moderate bilateral effusions. Bibasilar consolidations may be due to atelectasis or pneumonia. Electronically Signed   By: Jasmine Pang M.D.   On: 05/17/2023 18:58    Procedures Procedures    Medications Ordered in ED Medications  furosemide (LASIX) injection 80 mg (80 mg Intravenous Given 05/17/23 2104)    ED Course/ Medical Decision Making/ A&P                                 Medical Decision Making Amount and/or Complexity of Data Reviewed Labs: ordered. Radiology: ordered.  Risk Prescription drug management. Decision regarding hospitalization.   Patient is a 89 year old who presents with exertional shortness of breath and fatigue.  No associated chest discomfort.  He does not have any hypoxia.  Chest x-ray shows evidence of pulmonary edema/vascular congestion.  This was interpreted by me and confirmed by the radiologist.  No suggestions of pneumonia.  His BNP is markedly elevated.  His creatinine is mildly elevated but similar to prior values based on chart review.  He was given dose of IV Lasix.  His troponin is mildly elevated but stable on repeat check.  I discussed with Dr. Lazarus Salines who will admit the patient for further treatment.  Final Clinical Impression(s) / ED Diagnoses Final diagnoses:  Acute on chronic congestive heart  failure, unspecified heart failure type (HCC)  Elevated troponin    Rx / DC Orders ED Discharge Orders     None         Rolan Bucco, MD 05/17/23 2226

## 2023-05-17 NOTE — Telephone Encounter (Signed)
Spoke with [facility] RN and she stated that the pt is currently on Lasix 80 mg but not producing much urine and is SOB with abdominal and bilateral LE edema +2. Originally had placed pt with Robin Searing, NP for an appt today but after discussion with Alden Server, it was advised that the pt go get evaluated at the ED. Pt's HH RN, pt, and pt family all agreed with the plan and are currently going to ED.

## 2023-05-18 DIAGNOSIS — R7989 Other specified abnormal findings of blood chemistry: Secondary | ICD-10-CM | POA: Diagnosis not present

## 2023-05-18 DIAGNOSIS — E875 Hyperkalemia: Secondary | ICD-10-CM

## 2023-05-18 DIAGNOSIS — F0390 Unspecified dementia without behavioral disturbance: Secondary | ICD-10-CM

## 2023-05-18 DIAGNOSIS — J90 Pleural effusion, not elsewhere classified: Secondary | ICD-10-CM

## 2023-05-18 DIAGNOSIS — I5023 Acute on chronic systolic (congestive) heart failure: Secondary | ICD-10-CM | POA: Diagnosis not present

## 2023-05-18 DIAGNOSIS — N1832 Chronic kidney disease, stage 3b: Secondary | ICD-10-CM

## 2023-05-18 DIAGNOSIS — S91309A Unspecified open wound, unspecified foot, initial encounter: Secondary | ICD-10-CM

## 2023-05-18 DIAGNOSIS — Z66 Do not resuscitate: Secondary | ICD-10-CM

## 2023-05-18 LAB — VITAMIN B12: Vitamin B-12: 982 pg/mL — ABNORMAL HIGH (ref 180–914)

## 2023-05-18 LAB — BASIC METABOLIC PANEL
Anion gap: 6 (ref 5–15)
BUN: 40 mg/dL — ABNORMAL HIGH (ref 8–23)
CO2: 20 mmol/L — ABNORMAL LOW (ref 22–32)
Calcium: 7.4 mg/dL — ABNORMAL LOW (ref 8.9–10.3)
Chloride: 115 mmol/L — ABNORMAL HIGH (ref 98–111)
Creatinine, Ser: 1.28 mg/dL — ABNORMAL HIGH (ref 0.61–1.24)
GFR, Estimated: 51 mL/min — ABNORMAL LOW (ref >60)
Glucose, Bld: 95 mg/dL (ref 70–99)
Potassium: 3.6 mmol/L (ref 3.5–5.1)
Sodium: 141 mmol/L (ref 135–145)

## 2023-05-18 LAB — PHOSPHORUS: Phosphorus: 3.4 mg/dL (ref 2.5–4.6)

## 2023-05-18 LAB — MAGNESIUM: Magnesium: 1.6 mg/dL — ABNORMAL LOW (ref 1.7–2.4)

## 2023-05-18 LAB — TSH: TSH: 1.603 u[IU]/mL (ref 0.350–4.500)

## 2023-05-18 MED ORDER — COLCHICINE 0.3 MG HALF TABLET
0.3000 mg | ORAL_TABLET | Freq: Every day | ORAL | Status: DC
  Administered 2023-05-18: 0.3 mg via ORAL
  Filled 2023-05-18 (×2): qty 1

## 2023-05-18 MED ORDER — COLCHICINE 0.6 MG PO TABS: 0.6000 mg | ORAL_TABLET | Freq: Every day | ORAL | Status: DC

## 2023-05-18 MED ORDER — FUROSEMIDE 10 MG/ML IJ SOLN: 40.0000 mg | Freq: Every day | INTRAMUSCULAR | Status: DC

## 2023-05-18 NOTE — Progress Notes (Signed)
Heart Failure Navigator Progress Note  Assessed for Heart & Vascular TOC clinic readiness.  Patient does not meet criteria due to per MD note patient with history of Dementia. .   Navigator will sign off at this time.   Rhae Hammock, BSN, Scientist, clinical (histocompatibility and immunogenetics) Only

## 2023-05-18 NOTE — Assessment & Plan Note (Addendum)
05-18-2023 present on admission. Will get wound care consult. 05-19-2023 awaiting wound care consult.  05-22-2023 outpatient referral to podiatry for wound on the dorsal right foot with exposed tendon

## 2023-05-18 NOTE — Plan of Care (Signed)

## 2023-05-18 NOTE — Assessment & Plan Note (Addendum)
05-18-2023 chronic. 05-19-2023 chronic. 05-21-2023 stable. Will need SNF at discharge.  05-22-2023 stable

## 2023-05-18 NOTE — ED Notes (Signed)
Report called to Irving Burton, RN

## 2023-05-18 NOTE — Assessment & Plan Note (Addendum)
05-18-2023 baseline Scr 1.2-1.5 05-19-2023 Scr increased to 2.13. consistent with AKI from overdiuresis. Hold lasix for now. Repeat BMP in AM. 02-16-205 Scr 2.87. likely some ATN from hypotensive episode 2 nights ago. BP is stable now. I don't think aggressively hydrating him is worth the risk of CHF exacerbation. Discussed with pt's son Franky Macho who is 88 y/o and pt's sole caregiver. Discussed that pt is not a HD candidate. He agrees. 05-21-2023 Scr down to 2.4 today.  05-22-2023 stable

## 2023-05-18 NOTE — Assessment & Plan Note (Addendum)
05-18-2023 pt lying flat in bed. No edema. Will decide tomorrow if thoracentesis would be warranted. At this point, I don't think so since he is on RA and lying flat in bed. 05-19-2023 on RA and lying flat in bed. Risks of thoracentesis not worth it as he is on RA and able to lie flat in bed. 05-20-2023 discussed with pt's son Franky Macho. Thoracentesis too risky as pt is on RA and able to lie flat in bed. 05-21-2023 stable. No thoracentesis as pt is asymptomatic. On RA. Able to lie flat in bed.  05-22-2023 on RA and able to lie flat in bed without any distress. Risks of thoracentesis outweigh any potential benefit

## 2023-05-18 NOTE — Subjective & Objective (Addendum)
Pt seen and examined. Sitting up in bed eating breakfast. On RA.

## 2023-05-18 NOTE — Progress Notes (Signed)
PROGRESS NOTE    John Rose  UJW:[id number] DOB: 1922/08/18 DOA: 05/17/2023 PCP: Christell Constant, MD  Subjective: Pt seen and examined.  Demented. No family at bedside. Pt lying flat in bed. On RA. No LE edema today. Afebrile. No I&Os recorded as he has been in the ER all night.   Hospital Course: HPI: History limited due to patient's dementia, obtained by chart review/ED history  John Rose is a 89 y.o. male with hx of dementia, heart failure with reduced ejection fraction, CAD 3B, BPH, who presents with progressive dyspnea. On my interview patient is vague about reasons for coming into the reporting recent shortness of breath and "heart problem".    Per ED note:  " He presents with increased shortness of breath and weight gain. History is obtained from the patient and his son. He was seen on January 20 at the CHF clinic. At that time he was taking Lasix 80 mg a day. However his labs came back with an increased creatinine. They decrease his Lasix to 40 mg 3 times a week. He has been on the 2 mg dose. However over the last week he has had some increased shortness of breath and fatigue on exertion. He denies any associated chest discomfort. He has had some increased leg swelling. The son's been giving him his 80 mg dose for the last 2 days but he has not noticed any improvement in symptoms. He does not have any increased urination. No fevers. No cough or cold symptoms. "   Significant Events: Admitted 05/17/2023 for acute on chronic systolic CHF exacerbation   Significant Labs: WBC 3.0, HgB 13, Plt 78 BNP 2307 Na 135, K 5.2, BUN 44, Scr 1.69, glu 67  Significant Imaging Studies: CXR Cardiomegaly with vascular congestion and moderate bilateral effusions. Bibasilar consolidations may be due to atelectasis or pneumonia.  Antibiotic Therapy: Anti-infectives (From admission, onward)    None       Procedures:   Consultants:     Assessment and Plan: * Acute on  chronic systolic CHF (congestive heart failure) (HCC) 05-18-2023 based on clinical findings, he has diuresed well with IV lasix 80 mg daily.  He is lying flat in bed. On RA. No distress. His LE edema is only trace today.  Bilateral pleural effusion 05-18-2023 pt lying flat in bed. No edema. Will decide tomorrow if thoracentesis would be warranted. At this point, I don't think so since he is on RA and lying flat in bed.  Multiple open wounds of foot 05-18-2023 present on admission. Will get wound care consult.  Hyperkalemia 05-18-2023 K 3.6 today. Was 5.2 yesterday. Resolved.  Elevated troponin I level 05-18-2023 not ACS. Likely due to acute CHF.  Dementia (HCC) 05-18-2023 chronic.  Stage 3b chronic kidney disease (HCC) - baseline Scr 1.2-1.5 05-18-2023 baseline Scr 1.2-1.5  DNR (do not resuscitate)/DNI(Do Not Intubate)       DVT prophylaxis: enoxaparin (LOVENOX) injection 30 mg Start: 05/18/23 1000    Code Status: Limited: Do not attempt resuscitation (DNR) -DNR-LIMITED -Do Not Intubate/DNI  Family Communication: no family at bedside Disposition Plan: return home Reason for continuing need for hospitalization: continue with IV lasix.  Objective: Vitals:   05/18/23 1002 05/18/23 1045 05/18/23 1337 05/18/23 1403  BP:  (!) 147/89 (!) 140/90   Pulse:  80 77   Resp:  14 16   Temp: (!) 97.5 F (36.4 C)  (!) 97.5 F (36.4 C)   TempSrc: Oral  Oral   SpO2:  95%  99%   Weight:    72.6 kg  Height:    5\' 10"  (1.778 m)   No intake or output data in the 24 hours ending 05/18/23 1624 Filed Weights   05/17/23 1652 05/18/23 1403  Weight: 73.5 kg 72.6 kg  Examination:  Physical Exam Vitals and nursing note reviewed.  Constitutional:      General: He is not in acute distress.    Appearance: He is not toxic-appearing.     Comments: Elderly, demented. No distress. Lying flat in bed.  HENT:     Head: Normocephalic and atraumatic.  Cardiovascular:     Rate and Rhythm: Normal  rate and regular rhythm.  Pulmonary:     Effort: Pulmonary effort is normal. No respiratory distress.     Breath sounds: Normal breath sounds.  Abdominal:     General: Bowel sounds are normal.     Palpations: Abdomen is soft.  Skin:    Capillary Refill: Capillary refill takes less than 2 seconds.     Comments: Multiple duoderm bandages on his feet bilaterally Trace pretibial bilaterally   Data Reviewed: I have personally reviewed following labs and imaging studies  CBC: Recent Labs  Lab 05/17/23 1723  WBC 3.0*  NEUTROABS 2.0  HGB 13.0  HCT 43.0  MCV 106.4*  PLT 78*   Basic Metabolic Panel: Recent Labs  Lab 05/17/23 1723 05/18/23 0338  NA 135 141  K 5.2* 3.6  CL 104 115*  CO2 24 20*  GLUCOSE 67* 95  BUN 44* 40*  CREATININE 1.69* 1.28*  CALCIUM 9.4 7.4*  MG  --  1.6*  PHOS  --  3.4   GFR: Estimated Creatinine Clearance: 33.1 mL/min (A) (by C-G formula based on SCr of 1.28 mg/dL (H)). Liver Function Tests: Recent Labs  Lab 05/17/23 1723  AST 35  ALT 25  ALKPHOS 72  BILITOT 0.8  PROT 7.3  ALBUMIN 4.0   BNP (last 3 results) Recent Labs    12/19/22 0502 03/27/23 0140 05/17/23 1723  BNP 984.1* 1,694.3* 2,307.1*   Thyroid Function Tests: Recent Labs    05/18/23 0338  TSH 1.603   Anemia Panel: Recent Labs    05/18/23 0338  VITAMINB12 982*   Radiology Studies: DG Chest 2 View Result Date: 05/17/2023 CLINICAL DATA:  Shortness of breath EXAM: CHEST - 2 VIEW COMPARISON:  03/27/2023 FINDINGS: Moderate bilateral effusions. Cardiomegaly with vascular congestion. Bibasilar consolidations. IMPRESSION: Cardiomegaly with vascular congestion and moderate bilateral effusions. Bibasilar consolidations may be due to atelectasis or pneumonia. Electronically Signed   By: Jasmine Pang M.D.   On: 05/17/2023 18:58   Scheduled Meds:  colchicine  0.3 mg Oral Daily   enoxaparin (LOVENOX) injection  30 mg Subcutaneous Q24H   furosemide  80 mg Intravenous Daily    gabapentin  300 mg Oral BID   melatonin  6 mg Oral QHS   metoprolol succinate  25 mg Oral Daily   QUEtiapine  25 mg Oral QHS   sodium chloride flush  3 mL Intravenous Q12H   tamsulosin  0.4 mg Oral Daily   Continuous Infusions:   LOS: 1 day   Time spent: 40 minutes  Carollee Herter, DO  [facility]  05/18/2023, 4:24 PM

## 2023-05-18 NOTE — Assessment & Plan Note (Addendum)
Marland Kitchen

## 2023-05-18 NOTE — Assessment & Plan Note (Addendum)
05-18-2023 based on clinical findings, he has diuresed well with IV lasix 80 mg daily.  He is lying flat in bed. On RA. No distress. His LE edema is only trace today. 05-19-2023 based on last night's bout of hypotension, pt was probably overdiuresed. BNP 1447 today. Was 2307 on admission. Hold lasix for today. Continue with Toprol-xl. 05-20-2023 BP has improved with IVF overnight. Still on RA. Still stop further IVF as not to send him back into acute CHF. 05-21-2023 stable. On Toprol-XL 25 mg qday. Not on ARB/ACEI, entresto due to CKD. Scr improved to 2.4. no LE edema. Continue to hold lasix. Probably restart lasix tomorrow.  05-22-2023 stable for DC to SNF. Due to CKD, not on ARB/ACEI or entresto.  Restart lasix at 20 mg every other day.  And prn LE edema. Continue Toprol-XL 25 mg qday.

## 2023-05-18 NOTE — Hospital Course (Signed)
HPI: History limited due to patient's dementia, obtained by chart review/ED history  John Rose is a 89 y.o. male with hx of dementia, heart failure with reduced ejection fraction, CAD 3B, BPH, who presents with progressive dyspnea. On my interview patient is vague about reasons for coming into the reporting recent shortness of breath and "heart problem".    Per ED note:  " He presents with increased shortness of breath and weight gain. History is obtained from the patient and his son. He was seen on January 20 at the CHF clinic. At that time he was taking Lasix 80 mg a day. However his labs came back with an increased creatinine. They decrease his Lasix to 40 mg 3 times a week. He has been on the 2 mg dose. However over the last week he has had some increased shortness of breath and fatigue on exertion. He denies any associated chest discomfort. He has had some increased leg swelling. The son's been giving him his 80 mg dose for the last 2 days but he has not noticed any improvement in symptoms. He does not have any increased urination. No fevers. No cough or cold symptoms. "   Significant Events: Admitted 05/17/2023 for acute on chronic systolic CHF exacerbation   Significant Labs: WBC 3.0, HgB 13, Plt 78 BNP 2307 Na 135, K 5.2, BUN 44, Scr 1.69, glu 67  Significant Imaging Studies: CXR Cardiomegaly with vascular congestion and moderate bilateral effusions. Bibasilar consolidations may be due to atelectasis or pneumonia.  Antibiotic Therapy: Anti-infectives (From admission, onward)    None       Procedures:   Consultants:

## 2023-05-18 NOTE — Assessment & Plan Note (Addendum)
05-18-2023 not ACS. Likely due to acute CHF. 05-19-2023 stable

## 2023-05-18 NOTE — ED Notes (Signed)
ED TO INPATIENT HANDOFF REPORT  ED Nurse Name and Phone #:  Wynema Birch Name/Age/Gender John Rose 89 y.o. male Room/Bed: WA14/WA14  Code Status   Code Status: Limited: Do not attempt resuscitation (DNR) -DNR-LIMITED -Do Not Intubate/DNI   Home/SNF/Other Home Patient oriented to: self Is this baseline? Yes   Triage Complete: Triage complete  Chief Complaint Acute exacerbation of CHF (congestive heart failure) (HCC) [I50.9]  Triage Note BIB family from home for SOB, DOE, weight gain, edema. Ongoing over the last few weeks. No improvement despite recent lasix med changes. Seen by mobile PCP [facility] RN this am and instructed to go to ED. Denies fever or CP. Alert, NAD, calm.  Mentions feet pain related to edema and neuropathy.    Allergies No Known Allergies  Level of Care/Admitting Diagnosis ED Disposition     ED Disposition  Admit   Condition  --   Comment  Hospital Area: [facility] [[id number]]  Level of Care: Telemetry [5]  Admit to tele based on following criteria: Other see comments  Comments: heart failure  May admit patient to [facility] or [facility] if equivalent level of care is available:: No  Covid Evaluation: Asymptomatic - no recent exposure (last 10 days) testing not required  Diagnosis: Acute exacerbation of CHF (congestive heart failure) [facility]) [[id number]]  Admitting Physician: Dolly Rias [[id number]]  Attending Physician: Dolly Rias [[id number]]  Certification:: I certify this patient will need inpatient services for at least 2 midnights  Expected Medical Readiness: 05/21/2023          B Medical/Surgery History Past Medical History:  Diagnosis Date   Lumbar spondylosis 11/10/2016   Testosterone deficiency    Past Surgical History:  Procedure Laterality Date   NO PAST SURGERIES       A IV Location/Drains/Wounds Patient Lines/Drains/Airways Status     Active Line/Drains/Airways     Name Placement date  Placement time Site Days   Peripheral IV 05/17/23 20 G 1" Anterior;Right;Upper Arm 05/17/23  1722  Arm  1   Wound / Incision (Open or Dehisced) 08/04/21 Non-pressure wound Pretibial Right;Lateral 08/04/21  2030  Pretibial  652            Intake/Output Last 24 hours No intake or output data in the 24 hours ending 05/18/23 1208  Labs/Imaging Results for orders placed or performed during the hospital encounter of 05/17/23 (from the past 48 hours)  CBC with Differential     Status: Abnormal   Collection Time: 05/17/23  5:23 PM  Result Value Ref Range   WBC 3.0 (L) 4.0 - 10.5 K/uL   RBC 4.04 (L) 4.22 - 5.81 MIL/uL   Hemoglobin 13.0 13.0 - 17.0 g/dL   HCT 29.5 62.1 - 30.8 %   MCV 106.4 (H) 80.0 - 100.0 fL   MCH 32.2 26.0 - 34.0 pg   MCHC 30.2 30.0 - 36.0 g/dL   RDW 65.7 84.6 - 96.2 %   Platelets 78 (L) 150 - 400 K/uL    Comment: Immature Platelet Fraction may be clinically indicated, consider ordering this additional test [id number] CONSISTENT WITH PREVIOUS RESULT REPEATED TO VERIFY    nRBC 0.0 0.0 - 0.2 %   Neutrophils Relative % 64 %   Neutro Abs 2.0 1.7 - 7.7 K/uL   Lymphocytes Relative 22 %   Lymphs Abs 0.7 0.7 - 4.0 K/uL   Monocytes Relative 12 %   Monocytes Absolute 0.4 0.1 - 1.0 K/uL   Eosinophils Relative 1 %  Eosinophils Absolute 0.0 0.0 - 0.5 K/uL   Basophils Relative 1 %   Basophils Absolute 0.0 0.0 - 0.1 K/uL   Immature Granulocytes 0 %   Abs Immature Granulocytes 0.01 0.00 - 0.07 K/uL    Comment: Performed at [facility], [address], [place], [place] [address]  Comprehensive metabolic panel     Status: Abnormal   Collection Time: 05/17/23  5:23 PM  Result Value Ref Range   Sodium 135 135 - 145 mmol/L   Potassium 5.2 (H) 3.5 - 5.1 mmol/L   Chloride 104 98 - 111 mmol/L   CO2 24 22 - 32 mmol/L   Glucose, Bld 67 (L) 70 - 99 mg/dL    Comment: Glucose reference range applies only to samples taken after fasting for at least 8 hours.    BUN 44 (H) 8 - 23 mg/dL   Creatinine, Ser 4.69 (H) 0.61 - 1.24 mg/dL   Calcium 9.4 8.9 - 62.9 mg/dL   Total Protein 7.3 6.5 - 8.1 g/dL   Albumin 4.0 3.5 - 5.0 g/dL   AST 35 15 - 41 U/L   ALT 25 0 - 44 U/L   Alkaline Phosphatase 72 38 - 126 U/L   Total Bilirubin 0.8 0.0 - 1.2 mg/dL   GFR, Estimated 36 (L) >60 mL/min    Comment: (NOTE) Calculated using the CKD-EPI Creatinine Equation (2021)    Anion gap 7 5 - 15    Comment: Performed at [facility], [address], [place], [place] [address]  Brain natriuretic peptide     Status: Abnormal   Collection Time: 05/17/23  5:23 PM  Result Value Ref Range   B Natriuretic Peptide 2,307.1 (H) 0.0 - 100.0 pg/mL    Comment: Performed at [facility], [address] [place], [place] [address]  Troponin I (High Sensitivity)     Status: Abnormal   Collection Time: 05/17/23  5:23 PM  Result Value Ref Range   Troponin I (High Sensitivity) 108 (HH) <18 ng/L    Comment: CRITICAL RESULT CALLED TO, READ BACK BY AND VERIFIED WITH Dale Manahawkin, RN 05/17/23 1846 J. COLE (NOTE) Elevated high sensitivity troponin I (hsTnI) values and significant  changes across serial measurements may suggest ACS but many other  chronic and acute conditions are known to elevate hsTnI results.  Refer to the "Links" section for chest pain algorithms and additional  guidance. Performed at [facility], [address], [place], [place] [address]   Troponin I (High Sensitivity)     Status: Abnormal   Collection Time: 05/17/23  9:03 PM  Result Value Ref Range   Troponin I (High Sensitivity) 88 (H) <18 ng/L    Comment: DELTA CHECK NOTED (NOTE) Elevated high sensitivity troponin I (hsTnI) values and significant  changes across serial measurements may suggest ACS but many other  chronic and acute conditions are known to elevate hsTnI results.  Refer to the "Links" section for chest pain algorithms and additional   guidance. Performed at [facility], [address], [place], [place] [address]   Basic metabolic panel     Status: Abnormal   Collection Time: 05/18/23  3:38 AM  Result Value Ref Range   Sodium 141 135 - 145 mmol/L   Potassium 3.6 3.5 - 5.1 mmol/L    Comment: DELTA CHECK NOTED   Chloride 115 (H) 98 - 111 mmol/L   CO2 20 (L) 22 - 32 mmol/L   Glucose,  Bld 95 70 - 99 mg/dL    Comment: Glucose reference range applies only to samples taken after fasting for at least 8 hours.   BUN 40 (H) 8 - 23 mg/dL   Creatinine, Ser 0.98 (H) 0.61 - 1.24 mg/dL   Calcium 7.4 (L) 8.9 - 10.3 mg/dL    Comment: DELTA CHECK NOTED   GFR, Estimated 51 (L) >60 mL/min    Comment: (NOTE) Calculated using the CKD-EPI Creatinine Equation (2021)    Anion gap 6 5 - 15    Comment: Performed at [facility], [address] [place], [place] [address]  Magnesium     Status: Abnormal   Collection Time: 05/18/23  3:38 AM  Result Value Ref Range   Magnesium 1.6 (L) 1.7 - 2.4 mg/dL    Comment: Performed at [facility], [address] [place], [place] [address]  Phosphorus     Status: None   Collection Time: 05/18/23  3:38 AM  Result Value Ref Range   Phosphorus 3.4 2.5 - 4.6 mg/dL    Comment: Performed at [facility], [address], [place], [place] [address]  TSH     Status: None   Collection Time: 05/18/23  3:38 AM  Result Value Ref Range   TSH 1.603 0.350 - 4.500 uIU/mL    Comment: Performed by a 3rd Generation assay with a functional sensitivity of <=0.01 uIU/mL. Performed at [facility], [address] [place], [place] [address]   Vitamin B12     Status: Abnormal   Collection Time: 05/18/23  3:38 AM  Result Value Ref Range   Vitamin B-12 982 (H) 180 - 914 pg/mL    Comment: (NOTE) This assay is not validated for testing neonatal or myeloproliferative syndrome specimens for Vitamin B12 levels. Performed at  [facility], [address] [place], [place] [address]    DG Chest 2 View Result Date: 05/17/2023 CLINICAL DATA:  Shortness of breath EXAM: CHEST - 2 VIEW COMPARISON:  03/27/2023 FINDINGS: Moderate bilateral effusions. Cardiomegaly with vascular congestion. Bibasilar consolidations. IMPRESSION: Cardiomegaly with vascular congestion and moderate bilateral effusions. Bibasilar consolidations may be due to atelectasis or pneumonia. Electronically Signed   By: Jasmine Pang M.D.   On: 05/17/2023 18:58    Pending Labs Unresulted Labs (From admission, onward)    None       Vitals/Pain Today's Vitals   05/18/23 0612 05/18/23 0915 05/18/23 1002 05/18/23 1045  BP: 134/88 (!) 134/97  (!) 147/89  Pulse: 65 80  80  Resp: 14 18  14   Temp: 97.8 F (36.6 C)  (!) 97.5 F (36.4 C)   TempSrc: Oral  Oral   SpO2: 96% 98%  95%  Weight:      PainSc:    0-No pain    Isolation Precautions No active isolations  Medications Medications  enoxaparin (LOVENOX) injection 30 mg (30 mg Subcutaneous Given 05/18/23 1044)  sodium chloride flush (NS) 0.9 % injection 3 mL (3 mLs Intravenous Given 05/18/23 1045)  melatonin tablet 6 mg (6 mg Oral Given 05/17/23 2331)  acetaminophen (TYLENOL) tablet 1,000 mg (has no administration in time range)  albuterol (PROVENTIL) (2.5 MG/3ML) 0.083% nebulizer solution 2.5 mg (has no administration in time range)  melatonin tablet 6 mg (has no administration in time range)  ondansetron (ZOFRAN) injection 4 mg (has no administration in time range)  polyethylene glycol (MIRALAX / GLYCOLAX) packet 17 g (has no administration in time range)  furosemide (LASIX) injection 80 mg (80 mg Intravenous Given 05/18/23 1045)  gabapentin (NEURONTIN) capsule 300 mg (300 mg Oral Given 05/18/23 1045)  metoprolol succinate (TOPROL-XL) 24 hr tablet 25 mg (25 mg Oral Given 05/18/23 1045)  QUEtiapine (SEROQUEL) tablet 25 mg (25 mg Oral Given 05/17/23 2331)  tamsulosin (FLOMAX)  capsule 0.4 mg (0.4 mg Oral Given 05/18/23 1045)  colchicine tablet 0.3 mg (0.3 mg Oral Given 05/18/23 1047)  furosemide (LASIX) injection 80 mg (80 mg Intravenous Given 05/17/23 2104)    Mobility    Focused Assessments Neuro Assessment Handoff:  Swallow screen pass? Yes          Neuro Assessment: Exceptions to [facility] Neuro Checks:       If patient is a Neuro Trauma and patient is going to OR before floor call report to 4N Charge nurse: [phone] or [phone]  , Pulmonary Assessment Handoff:  Lung sounds: Bilateral Breath Sounds: Diminished, Clear O2 Device: Room Air      R Recommendations: See Admitting Provider Note  Report given to:   Additional Notes:

## 2023-05-18 NOTE — Assessment & Plan Note (Signed)
05-18-2023 K 3.6 today. Was 5.2 yesterday. Resolved.

## 2023-05-19 DIAGNOSIS — F0390 Unspecified dementia without behavioral disturbance: Secondary | ICD-10-CM | POA: Diagnosis not present

## 2023-05-19 DIAGNOSIS — E875 Hyperkalemia: Secondary | ICD-10-CM | POA: Diagnosis not present

## 2023-05-19 DIAGNOSIS — I5023 Acute on chronic systolic (congestive) heart failure: Secondary | ICD-10-CM | POA: Diagnosis not present

## 2023-05-19 DIAGNOSIS — J90 Pleural effusion, not elsewhere classified: Secondary | ICD-10-CM | POA: Diagnosis not present

## 2023-05-19 LAB — GLUCOSE, CAPILLARY: Glucose-Capillary: 173 mg/dL — ABNORMAL HIGH (ref 70–99)

## 2023-05-19 LAB — COMPREHENSIVE METABOLIC PANEL
ALT: 21 U/L (ref 0–44)
AST: 28 U/L (ref 15–41)
Albumin: 3.7 g/dL (ref 3.5–5.0)
Alkaline Phosphatase: 62 U/L (ref 38–126)
Anion gap: 11 (ref 5–15)
BUN: 45 mg/dL — ABNORMAL HIGH (ref 8–23)
CO2: 22 mmol/L (ref 22–32)
Calcium: 9.2 mg/dL (ref 8.9–10.3)
Chloride: 104 mmol/L (ref 98–111)
Creatinine, Ser: 2.13 mg/dL — ABNORMAL HIGH (ref 0.61–1.24)
GFR, Estimated: 27 mL/min — ABNORMAL LOW (ref >60)
Glucose, Bld: 148 mg/dL — ABNORMAL HIGH (ref 70–99)
Potassium: 4.5 mmol/L (ref 3.5–5.1)
Sodium: 137 mmol/L (ref 135–145)
Total Bilirubin: 1.2 mg/dL (ref 0.0–1.2)
Total Protein: 6.5 g/dL (ref 6.5–8.1)

## 2023-05-19 LAB — BLOOD GAS, ARTERIAL
Acid-Base Excess: 0.3 mmol/L (ref 0.0–2.0)
Bicarbonate: 23.5 mmol/L (ref 20.0–28.0)
Drawn by: 56037
O2 Saturation: 98.5 %
Patient temperature: 36.8
pCO2 arterial: 33 mm[Hg] (ref 32–48)
pH, Arterial: 7.46 — ABNORMAL HIGH (ref 7.35–7.45)
pO2, Arterial: 68 mm[Hg] — ABNORMAL LOW (ref 83–108)

## 2023-05-19 LAB — MAGNESIUM: Magnesium: 2 mg/dL (ref 1.7–2.4)

## 2023-05-19 LAB — BRAIN NATRIURETIC PEPTIDE: B Natriuretic Peptide: 1447 pg/mL — ABNORMAL HIGH (ref 0.0–100.0)

## 2023-05-19 MED ORDER — LACTATED RINGERS IV SOLN: INTRAVENOUS | Status: DC

## 2023-05-19 MED ORDER — ACETAMINOPHEN 650 MG RE SUPP
650.0000 mg | RECTAL | Status: DC | PRN
  Administered 2023-05-19: 650 mg via RECTAL
  Filled 2023-05-19: qty 1

## 2023-05-19 MED ORDER — METOPROLOL SUCCINATE ER 25 MG PO TB24
25.0000 mg | ORAL_TABLET | Freq: Every day | ORAL | Status: DC
Start: 2023-05-20 — End: 2023-05-22
  Administered 2023-05-20 – 2023-05-22 (×3): 25 mg via ORAL
  Filled 2023-05-19 (×3): qty 1

## 2023-05-19 MED ORDER — MIDODRINE HCL 5 MG PO TABS
2.5000 mg | ORAL_TABLET | Freq: Three times a day (TID) | ORAL | Status: DC
  Administered 2023-05-19 – 2023-05-22 (×8): 2.5 mg via ORAL
  Filled 2023-05-19 (×8): qty 1

## 2023-05-19 MED ORDER — ALBUMIN HUMAN 25 % IV SOLN
25.0000 g | Freq: Once | INTRAVENOUS | Status: AC
  Administered 2023-05-19: 25 g via INTRAVENOUS
  Filled 2023-05-19: qty 100

## 2023-05-19 MED ORDER — CHLORHEXIDINE GLUCONATE CLOTH 2 % EX PADS
6.0000 | MEDICATED_PAD | Freq: Every day | CUTANEOUS | Status: DC
  Administered 2023-05-19 – 2023-05-22 (×4): 6 via TOPICAL

## 2023-05-19 NOTE — Progress Notes (Signed)
OT Cancellation Note  Patient Details Name: John Rose MRN: [id number] DOB: Dec 19, 1922   Cancelled Treatment:    Reason Eval/Treat Not Completed: Patient not medically ready Patient lethargic in room moaning when moving around in bed. Patient able to answer first name but not verbalizing anything else. Two nieces were present in room. Nurse updated on attempt and patient appearing to have pain when he moved in bed.  OT to continue to follow and check back on 2/16.  [name], MS Acute Rehabilitation Department Office# [phone]  05/19/2023, 10:39 AM

## 2023-05-19 NOTE — Progress Notes (Addendum)
eLink Physician-Brief Progress Note Patient Name: John Rose DOB: 1922-09-23 MRN: [id number]   Date of Service  05/19/2023  HPI/Events of Note   89 y.o. male with hx of dementia, heart failure with reduced ejection fraction, CAD 3B, BPH, who presents with progressive dyspnea.   Patient is on progressively units.  Notified the patient and his hypothyroidism, minimally responsive  eICU Interventions  Placed the patient in Trendelenburg positioning, albumin bolus ordered, likely overdiuresis.  Adequate IV access.  Placed on nonrebreather.  BP improved to 90s over 50s with a MAP of 70.  Rapid response team incoming.  ABG after Albumin recussitation   0417 -ABG looks pretty good for being on room air.  No intervention indicated.  Blood pressure and mentation improved.  Intervention Category Major Interventions: Hypotension - evaluation and management  John Rose 05/19/2023, 1:41 AM

## 2023-05-19 NOTE — Progress Notes (Signed)
PROGRESS NOTE    [name]  ZOX:[id number] DOB: 1922-11-13 DOA: 05/17/2023 PCP: Christell Constant, MD  Subjective: Pt seen and examined. Events of last night noted. Received IV albumin Today, pt remains lying flat. No LE edema. On RA. BP has improved. No family at bedside.   Hospital Course: HPI: History limited due to patient's dementia, obtained by chart review/ED history  John Rose is a 89 y.o. male with hx of dementia, heart failure with reduced ejection fraction, CAD 3B, BPH, who presents with progressive dyspnea. On my interview patient is vague about reasons for coming into the reporting recent shortness of breath and "heart problem".    Per ED note:  " He presents with increased shortness of breath and weight gain. History is obtained from the patient and his son. He was seen on January 20 at the CHF clinic. At that time he was taking Lasix 80 mg a day. However his labs came back with an increased creatinine. They decrease his Lasix to 40 mg 3 times a week. He has been on the 2 mg dose. However over the last week he has had some increased shortness of breath and fatigue on exertion. He denies any associated chest discomfort. He has had some increased leg swelling. The son's been giving him his 80 mg dose for the last 2 days but he has not noticed any improvement in symptoms. He does not have any increased urination. No fevers. No cough or cold symptoms. "   Significant Events: Admitted 05/17/2023 for acute on chronic systolic CHF exacerbation   Significant Labs: WBC 3.0, HgB 13, Plt 78 BNP 2307 Na 135, K 5.2, BUN 44, Scr 1.69, glu 67  Significant Imaging Studies: CXR Cardiomegaly with vascular congestion and moderate bilateral effusions. Bibasilar consolidations may be due to atelectasis or pneumonia.  Antibiotic Therapy: Anti-infectives (From admission, onward)    None       Procedures:   Consultants:     Assessment and Plan: * Acute on chronic  systolic CHF (congestive heart failure) (HCC) 05-18-2023 based on clinical findings, he has diuresed well with IV lasix 80 mg daily.  He is lying flat in bed. On RA. No distress. His LE edema is only trace today. 05-19-2023 based on last night's bout of hypotension, pt was probably overdiuresed. BNP 1447 today. Was 2307 on admission. Hold lasix for today. Continue with Toprol-xl.  Bilateral pleural effusion 05-18-2023 pt lying flat in bed. No edema. Will decide tomorrow if thoracentesis would be warranted. At this point, I don't think so since he is on RA and lying flat in bed.  05-19-2023 on RA and lying flat in bed. Risks of thoracentesis not worth it as he is on RA and able to lie flat in bed.  Multiple open wounds of foot 05-18-2023 present on admission. Will get wound care consult. 05-19-2023 awaiting wound care consult.  Hyperkalemia 05-18-2023 K 3.6 today. Was 5.2 yesterday. Resolved.  Elevated troponin I level 05-18-2023 not ACS. Likely due to acute CHF. 05-19-2023 stable  Dementia (HCC) 05-18-2023 chronic. 05-19-2023 chronic.  Stage 3b chronic kidney disease (HCC) - baseline Scr 1.2-1.5 05-18-2023 baseline Scr 1.2-1.5 05-19-2023 Scr increased to 2.13. consistent with AKI from overdiuresis. Hold lasix for now. Repeat BMP in AM.  DNR (do not resuscitate)/DNI(Do Not Intubate)      DVT prophylaxis: enoxaparin (LOVENOX) injection 30 mg Start: 05/18/23 1000    Code Status: Limited: Do not attempt resuscitation (DNR) -DNR-LIMITED -Do Not Intubate/DNI  Family Communication: no  family at bedside Disposition Plan: unknown Reason for continuing need for hospitalization: monitoring renal function.  Objective: Vitals:   05/19/23 0500 05/19/23 0519 05/19/23 0701 05/19/23 1147  BP:  102/61 101/67 101/70  Pulse:  71 72 72  Resp:  20 17 20   Temp:  98 F (36.7 C) 98.6 F (37 C) 98.1 F (36.7 C)  TempSrc:  Oral Oral Oral  SpO2:  100% 97% 94%  Weight: 73.5 kg     Height:         Intake/Output Summary (Last 24 hours) at 05/19/2023 1402 Last data filed at 05/19/2023 1225 Gross per 24 hour  Intake 0 ml  Output 1420 ml  Net -1420 ml   Filed Weights   05/17/23 1652 05/18/23 1403 05/19/23 0500  Weight: 73.5 kg 72.6 kg 73.5 kg    Examination:  Physical Exam Vitals and nursing note reviewed.  Constitutional:      Comments: Demented. Lying flat in bed. No distress  HENT:     Head: Normocephalic and atraumatic.  Cardiovascular:     Rate and Rhythm: Normal rate and regular rhythm.  Pulmonary:     Effort: Pulmonary effort is normal.     Breath sounds: Normal breath sounds.  Abdominal:     General: Abdomen is flat. Bowel sounds are normal.  Musculoskeletal:     Right lower leg: No edema.     Left lower leg: No edema.     Comments: No edema of pretibial area, ankles or feet bilaterally  Skin:    General: Skin is warm and dry.     Capillary Refill: Capillary refill takes less than 2 seconds.     Comments: Multiple bandages on his feet  Neurological:     Comments: demented     Data Reviewed: I have personally reviewed following labs and imaging studies  CBC: Recent Labs  Lab 05/17/23 1723  WBC 3.0*  NEUTROABS 2.0  HGB 13.0  HCT 43.0  MCV 106.4*  PLT 78*   Basic Metabolic Panel: Recent Labs  Lab 05/17/23 1723 05/18/23 0338 05/19/23 0236  NA 135 141 137  K 5.2* 3.6 4.5  CL 104 115* 104  CO2 24 20* 22  GLUCOSE 67* 95 148*  BUN 44* 40* 45*  CREATININE 1.69* 1.28* 2.13*  CALCIUM 9.4 7.4* 9.2  MG  --  1.6* 2.0  PHOS  --  3.4  --    GFR: Estimated Creatinine Clearance: 20 mL/min (A) (by C-G formula based on SCr of 2.13 mg/dL (H)). Liver Function Tests: Recent Labs  Lab 05/17/23 1723 05/19/23 0236  AST 35 28  ALT 25 21  ALKPHOS 72 62  BILITOT 0.8 1.2  PROT 7.3 6.5  ALBUMIN 4.0 3.7   BNP (last 3 results) Recent Labs    03/27/23 0140 05/17/23 1723 05/19/23 0236  BNP 1,694.3* 2,307.1* 1,447.0*   CBG: Recent Labs  Lab  05/19/23 0127  GLUCAP 173*   Thyroid Function Tests: Recent Labs    05/18/23 0338  TSH 1.603   Anemia Panel: Recent Labs    05/18/23 0338  VITAMINB12 982*   Radiology Studies: DG Chest 2 View Result Date: 05/17/2023 CLINICAL DATA:  Shortness of breath EXAM: CHEST - 2 VIEW COMPARISON:  03/27/2023 FINDINGS: Moderate bilateral effusions. Cardiomegaly with vascular congestion. Bibasilar consolidations. IMPRESSION: Cardiomegaly with vascular congestion and moderate bilateral effusions. Bibasilar consolidations may be due to atelectasis or pneumonia. Electronically Signed   By: Jasmine Pang M.D.   On: 05/17/2023 18:58  Scheduled Meds:  Chlorhexidine Gluconate Cloth  6 each Topical Daily   colchicine  0.3 mg Oral Daily   enoxaparin (LOVENOX) injection  30 mg Subcutaneous Q24H   gabapentin  300 mg Oral BID   melatonin  6 mg Oral QHS   [START ON 05/20/2023] metoprolol succinate  25 mg Oral Daily   QUEtiapine  25 mg Oral QHS   sodium chloride flush  3 mL Intravenous Q12H   tamsulosin  0.4 mg Oral Daily   Continuous Infusions:   LOS: 2 days   Time spent: 40 minutes  Carollee Herter, DO  [facility]  05/19/2023, 2:02 PM

## 2023-05-19 NOTE — Progress Notes (Signed)
Rapid Response Event Note   Reason for Call : low b/p and less responsive    Initial Focused Assessment: pt moaning and groaning but able to follow some simple commands.  Pt is lying trendelenburg SpO2 unable to obtain.  Pit is HOH with hearing aids in intact.  Pt is diaphoretic.  But denies pain.  Breath sounds decreased and bowel sounds intact. Pulses intact.  Pt would not let his pupils be checked .                                                                           Interventions: Pt CBG completed.  Pt placed on Parker and then advanced to a NRB mask.  Elink MD is on monitor placing orders and [facility], NP has been informed.   See chart for orders received and initiated.  VSS see flow sheet. Am labs to drawn.     Plan of Care: Blood pressure and pt mentation is much better.  Pt will remain in current location.      Event Summary:   MD Notified: yes Call Time: 0132 Arrival Time: 0134 End Time: 0215  Conley Rolls, RN

## 2023-05-19 NOTE — Progress Notes (Signed)
   05/19/23 1509  Assess: MEWS Score  Temp 98.3 F (36.8 C)  BP 100/67  MAP (mmHg) 78  Pulse Rate 82  Resp 18  SpO2 98 %  O2 Device Room Air  Assess: MEWS Score  MEWS Temp 0  MEWS Systolic 1  MEWS Pulse 0  MEWS RR 0  MEWS LOC 1  MEWS Score 2  MEWS Score Color Yellow  Assess: if the MEWS score is Yellow or Red  Were vital signs accurate and taken at a resting state? Yes  Does the patient meet 2 or more of the SIRS criteria? No  MEWS guidelines implemented  Yes, yellow  Treat  MEWS Interventions Considered administering scheduled or prn medications/treatments as ordered  Take Vital Signs  Increase Vital Sign Frequency  Yellow: Q2hr x1, continue Q4hrs until patient remains green for 12hrs  Escalate  MEWS: Escalate Yellow: Discuss with charge nurse and consider notifying provider and/or RRT  Notify: Charge Nurse/RN  Name of Charge Nurse/RN Notified Shearon Stalls, RN  Assess: SIRS CRITERIA  SIRS Temperature  0  SIRS Respirations  0  SIRS Pulse 0  SIRS WBC 0  SIRS Score Sum  0

## 2023-05-19 NOTE — Progress Notes (Signed)
    Patient Name: John Rose           DOB: 28-Feb-1923  MRN: [id number]      Admission Date: 05/17/2023  Attending Provider: Carollee Herter, DO  Primary Diagnosis: Acute on chronic systolic CHF (congestive heart failure) (HCC)   Level of care: Telemetry    CROSS COVER NOTE   Date of Service   05/19/2023   John Rose, 89 y.o. male, was admitted on 05/17/2023 for Acute on chronic systolic CHF (congestive heart failure) (HCC).    HPI/Events of Note   Notified of hypotensive event that [facility] physician responded to.   While checking VS, pt was found to have a SBP 50-60s, and was less responsive.  Nursing staff was unable to obtain SpO2, so patient was placed on nonrebreather and E link button was activated.   Patient's blood pressure responded well to Trendelenburg position and IV albumin, BP now  is 90/ 50s.  Pulse oximetry obtained, SpO2 97% on room air.  In the past 24 hours, patient has received gabapentin, metoprolol, Seroquel, IV Lasix that could have contributed to patient's hypotensive episode.    On my assessment, patient is hemodynamically stable.  Does not appear to be in acute distress.   He responds well to verbal and physical stimulation.  A/O x 2.  Follows commands.  Denies pain, shortness of breath, palpitations, abdominal discomfort.  Mentation appears to be back at baseline.  He is on room air, breath sounds diminished and equal.  Blood pressure improving.   Interventions/ Plan   Reassess BP every hour x 2 Hold antihypertensive meds, sedating, analgesics until BP stabilizes.        Anthoney Harada, DNP, ACNPC- AG [facility]

## 2023-05-19 NOTE — Progress Notes (Signed)
At 0123 Rizchelle Talla NT checked this patient's vitals and found out that patient is hypotensive BP 69/36 left arm 57/37 right arm. This nurse was called by the nurse tech to check the patient. Patient was seen diaphoretic, with cold clammy skin, pulses are weak. Patient just moans follows commands when asked to open his eyes. CBG was done 173mg /dl. Telehealth button was activated. Genell RN of Rapid Response team and Kristine RN charge nurse were also informed and went in the patients room. Patient was placed on trendelenburg position. Albumin infusion was started as ordered by Conrad Carter MD. Patients BP went up to 116/71. Patient was put on Yellow mews and this nurse will be closely monitoring this patient. Liana Crocker NP was informed of this event too.

## 2023-05-20 DIAGNOSIS — R5381 Other malaise: Secondary | ICD-10-CM | POA: Insufficient documentation

## 2023-05-20 DIAGNOSIS — J90 Pleural effusion, not elsewhere classified: Secondary | ICD-10-CM | POA: Diagnosis not present

## 2023-05-20 DIAGNOSIS — S91301A Unspecified open wound, right foot, initial encounter: Secondary | ICD-10-CM | POA: Insufficient documentation

## 2023-05-20 DIAGNOSIS — S96901A Unspecified injury of unspecified muscle and tendon at ankle and foot level, right foot, initial encounter: Secondary | ICD-10-CM | POA: Insufficient documentation

## 2023-05-20 DIAGNOSIS — F0390 Unspecified dementia without behavioral disturbance: Secondary | ICD-10-CM | POA: Diagnosis not present

## 2023-05-20 DIAGNOSIS — I5023 Acute on chronic systolic (congestive) heart failure: Secondary | ICD-10-CM | POA: Diagnosis not present

## 2023-05-20 LAB — CBC WITH DIFFERENTIAL/PLATELET
Abs Immature Granulocytes: 0.01 10*3/uL (ref 0.00–0.07)
Basophils Absolute: 0 10*3/uL (ref 0.0–0.1)
Basophils Relative: 1 %
Eosinophils Absolute: 0 10*3/uL (ref 0.0–0.5)
Eosinophils Relative: 1 %
HCT: 39.2 % (ref 39.0–52.0)
Hemoglobin: 12 g/dL — ABNORMAL LOW (ref 13.0–17.0)
Immature Granulocytes: 0 %
Lymphocytes Relative: 27 %
Lymphs Abs: 1.1 10*3/uL (ref 0.7–4.0)
MCH: 32 pg (ref 26.0–34.0)
MCHC: 30.6 g/dL (ref 30.0–36.0)
MCV: 104.5 fL — ABNORMAL HIGH (ref 80.0–100.0)
Monocytes Absolute: 0.6 10*3/uL (ref 0.1–1.0)
Monocytes Relative: 15 %
Neutro Abs: 2.4 10*3/uL (ref 1.7–7.7)
Neutrophils Relative %: 56 %
Platelets: 77 10*3/uL — ABNORMAL LOW (ref 150–400)
RBC: 3.75 MIL/uL — ABNORMAL LOW (ref 4.22–5.81)
RDW: 15.4 % (ref 11.5–15.5)
WBC: 4.2 10*3/uL (ref 4.0–10.5)
nRBC: 0 % (ref 0.0–0.2)

## 2023-05-20 LAB — COMPREHENSIVE METABOLIC PANEL
ALT: 22 U/L (ref 0–44)
AST: 74 U/L — ABNORMAL HIGH (ref 15–41)
Albumin: 3.6 g/dL (ref 3.5–5.0)
Alkaline Phosphatase: 56 U/L (ref 38–126)
Anion gap: 12 (ref 5–15)
BUN: 55 mg/dL — ABNORMAL HIGH (ref 8–23)
CO2: 23 mmol/L (ref 22–32)
Calcium: 9.1 mg/dL (ref 8.9–10.3)
Chloride: 103 mmol/L (ref 98–111)
Creatinine, Ser: 2.87 mg/dL — ABNORMAL HIGH (ref 0.61–1.24)
GFR, Estimated: 19 mL/min — ABNORMAL LOW (ref >60)
Glucose, Bld: 118 mg/dL — ABNORMAL HIGH (ref 70–99)
Potassium: 4.9 mmol/L (ref 3.5–5.1)
Sodium: 138 mmol/L (ref 135–145)
Total Bilirubin: 1.3 mg/dL — ABNORMAL HIGH (ref 0.0–1.2)
Total Protein: 6.5 g/dL (ref 6.5–8.1)

## 2023-05-20 LAB — MAGNESIUM: Magnesium: 1.9 mg/dL (ref 1.7–2.4)

## 2023-05-20 NOTE — Consult Note (Signed)
WOC Nurse Consult Note: Reason for Consult: foot wound Scaly skin noted on the dorsal left foot; however has a wound on the dorsal right foot with exposed tendon. He is not followed that I can tell by podiatry or wound care center.  Wound type: full thickness ulceration left foot  Pressure Injury POA: NA Measurement: see nursing flow sheets  Wound MWN:UUVOZD tendon centrally  Drainage (amount, consistency, odor) scant Periwound: intact  Dressing procedure/placement/frequency: Cleanse with saline, pat dry Apply hydrogel (Lawson# [phone]), top with dry dressing. Apply daily  Consider podiatry referral at DC   Re consult if needed, will not follow at this time. Thanks  Nawaal Alling [employer], RN,CWOCN, CNS, CWON-AP [phone])

## 2023-05-20 NOTE — Assessment & Plan Note (Signed)
05-20-2023. Present on admission Scaly skin noted on the dorsal left foot; however has a wound on the dorsal right foot with exposed tendon. He is not followed that I can tell by podiatry or wound care center.  Wound type: full thickness ulceration left foot  Pressure Injury POA: NA Measurement: see nursing flow sheets  Wound YIR:SWNIOE tendon centrally  Drainage (amount, consistency, odor) scant Periwound: intact  Dressing procedure/placement/frequency: Cleanse with saline, pat dry Apply hydrogel (Lawson# [phone]), top with dry dressing. Apply daily

## 2023-05-20 NOTE — Evaluation (Signed)
Occupational Therapy Evaluation Patient Details Name: John Rose MRN: [id number] DOB: 19-Jan-1923 Today's Date: 05/20/2023   History of Present Illness   Patient is a 89 year old male who presented on 2/13 with progressive dyspnea. Patient was admitted with acute on chronic systolic CHF, bilateral pleural effusion, multiple open foot wounds, elevated troponin. PMH: dementia, CKD III,     Clinical Impressions Patient is a 89 year old male who was admitted for above. Patient was living at home alone per patient report. Patient is not a reliable source of information and family was not available to answer PLOF questions at this time. Patient was noted to have decreased functional activity tolerance, decreased endurance, decreased standing balance, decreased safety awareness, and decreased knowledge of AD/AE impacting participation in ADLs. Patient would continue to benefit from skilled OT services at this time while admitted and after d/c to address noted deficits in order to improve overall safety and independence in ADLs. Patient will benefit from continued inpatient follow up therapy, <3 hours/day      If plan is discharge home, recommend the following:   A lot of help with bathing/dressing/bathroom;A lot of help with walking and/or transfers;Direct supervision/assist for medications management;Assist for transportation;Assistance with cooking/housework;Direct supervision/assist for financial management;Help with stairs or ramp for entrance     Functional Status Assessment   Patient has had a recent decline in their functional status and demonstrates the ability to make significant improvements in function in a reasonable and predictable amount of time.     Equipment Recommendations   None recommended by OT      Precautions/Restrictions   Precautions Precautions: Fall Recall of Precautions/Restrictions: Impaired Restrictions Weight Bearing Restrictions Per Provider Order:  No     Mobility Bed Mobility Overal bed mobility: Needs Assistance Bed Mobility: Supine to Sit     Supine to sit: Min assist, HOB elevated, Used rails     General bed mobility comments: with increased time.          Balance Overall balance assessment: Mild deficits observed, not formally tested           ADL either performed or assessed with clinical judgement   ADL Overall ADL's : Needs assistance/impaired Eating/Feeding: Set up;Sitting Eating/Feeding Details (indicate cue type and reason): provided with red foam handles to make it easier to hold utensils. patient endorsed it was easier to self feed with them. note left to remind nursing on board in room. Grooming: Sitting;Minimal assistance Grooming Details (indicate cue type and reason): cues for washing hands with purel wipe prior to eating breakfast. Upper Body Bathing: Sitting;Minimal assistance   Lower Body Bathing: Total assistance;Sitting/lateral leans   Upper Body Dressing : Minimal assistance;Sitting   Lower Body Dressing: Sitting/lateral leans;Total assistance   Toilet Transfer: Moderate assistance;Stand-pivot;Rolling walker (2 wheels) Toilet Transfer Details (indicate cue type and reason): to recliner in room with increased time and cues for progression of BLE. Toileting- Architect and Hygiene: Sit to/from stand;Total assistance               Vision Baseline Vision/History: 1 Wears glasses              Pertinent Vitals/Pain Pain Assessment Pain Assessment: No/denies pain     Extremity/Trunk Assessment Upper Extremity Assessment Upper Extremity Assessment: Right hand dominant              Cognition Arousal: Alert Behavior During Therapy: Flat affect Cognition: Cognition impaired   Orientation impairments: Person Awareness: Intellectual awareness impaired, Online awareness  impaired Memory impairment (select all impairments): Short-term memory Attention impairment  (select first level of impairment): Sustained attention   OT - Cognition Comments: patient reported " i need to check with the boss to see what he wants me to do" 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                 Following commands: Impaired Following commands impaired: Follows one step commands inconsistently                Home Living Family/patient expects to be discharged to:: Private residence Living Arrangements: Alone (per patient report. no family in room to confirm)                               Additional Comments: Pt poor historian, unable to get home set up at this time      Prior Functioning/Environment Prior Level of Function : Patient poor historian/Family not available                    OT Problem List: Impaired balance (sitting and/or standing);Decreased safety awareness;Decreased knowledge of use of DME or AE;Decreased activity tolerance;Decreased knowledge of precautions;Impaired UE functional use   OT Treatment/Interventions: Self-care/ADL training;DME and/or AE instruction;Therapeutic activities;Balance training;Therapeutic exercise;Neuromuscular education;Patient/family education      OT Goals(Current goals can be found in the care plan section)   Acute Rehab OT Goals Patient Stated Goal: to "call the boss to see if he wants me to work yet" OT Goal Formulation: With patient Time For Goal Achievement: 06/03/23 Potential to Achieve Goals: Fair   OT Frequency:  Min 1X/week       AM-PAC OT "6 Clicks" Daily Activity     Outcome Measure Help from another person eating meals?: A Little Help from another person taking care of personal grooming?: A Lot Help from another person toileting, which includes using toliet, bedpan, or urinal?: A Lot Help from another person bathing (including washing, rinsing, drying)?: A Lot Help from another person to put on and taking  off regular upper body clothing?: A Lot Help from another person to put on and taking off regular lower body clothing?: A Lot 6 Click Score: 13   End of Session Equipment Utilized During Treatment: Gait belt;Rolling walker (2 wheels) Nurse Communication: Other (comment) (ok to participate in session.)  Activity Tolerance: Patient tolerated treatment well Patient left: in chair;with call bell/phone within reach;with chair alarm set;with nursing/sitter in room  OT Visit Diagnosis: Unsteadiness on feet (R26.81);Other abnormalities of gait and mobility (R26.89)                Time: 1610-9604 OT Time Calculation (min): 21 min Charges:  OT General Charges $OT Visit: 1 Visit OT Evaluation $OT Eval Low Complexity: 1 Low  Denetta Fei OTR/L, MS Acute Rehabilitation Department Office# [phone]   Selinda Flavin 05/20/2023, 9:14 AM

## 2023-05-20 NOTE — Progress Notes (Signed)
PROGRESS NOTE    John Rose  AOZ:[id number] DOB: 1922/04/14 DOA: 05/17/2023 PCP: Christell Constant, MD  Subjective: Pt seen and examined. Pt sitting up in recliner. On RA. Awake.   Hospital Course: HPI: History limited due to patient's dementia, obtained by chart review/ED history  John Rose is a 89 y.o. male with hx of dementia, heart failure with reduced ejection fraction, CAD 3B, BPH, who presents with progressive dyspnea. On my interview patient is vague about reasons for coming into the reporting recent shortness of breath and "heart problem".    Per ED note:  " He presents with increased shortness of breath and weight gain. History is obtained from the patient and his son. He was seen on January 20 at the CHF clinic. At that time he was taking Lasix 80 mg a day. However his labs came back with an increased creatinine. They decrease his Lasix to 40 mg 3 times a week. He has been on the 2 mg dose. However over the last week he has had some increased shortness of breath and fatigue on exertion. He denies any associated chest discomfort. He has had some increased leg swelling. The son's been giving him his 80 mg dose for the last 2 days but he has not noticed any improvement in symptoms. He does not have any increased urination. No fevers. No cough or cold symptoms. "   Significant Events: Admitted 05/17/2023 for acute on chronic systolic CHF exacerbation   Significant Labs: WBC 3.0, HgB 13, Plt 78 BNP 2307 Na 135, K 5.2, BUN 44, Scr 1.69, glu 67  Significant Imaging Studies: CXR Cardiomegaly with vascular congestion and moderate bilateral effusions. Bibasilar consolidations may be due to atelectasis or pneumonia.  Antibiotic Therapy: Anti-infectives (From admission, onward)    None       Procedures:   Consultants:     Assessment and Plan: * Acute on chronic systolic CHF (congestive heart failure) (HCC) 05-18-2023 based on clinical findings, he has  diuresed well with IV lasix 80 mg daily.  He is lying flat in bed. On RA. No distress. His LE edema is only trace today. 05-19-2023 based on last night's bout of hypotension, pt was probably overdiuresed. BNP 1447 today. Was 2307 on admission. Hold lasix for today. Continue with Toprol-xl. 05-20-2023 BP has improved with IVF overnight. Still on RA. Still stop further IVF as not to send him back into acute CHF.  Bilateral pleural effusion 05-18-2023 pt lying flat in bed. No edema. Will decide tomorrow if thoracentesis would be warranted. At this point, I don't think so since he is on RA and lying flat in bed. 05-19-2023 on RA and lying flat in bed. Risks of thoracentesis not worth it as he is on RA and able to lie flat in bed.  05-20-2023 discussed with pt's son Franky Macho. Thoracentesis too risky as pt is on RA and able to lie flat in bed.  Multiple open wounds of foot 05-18-2023 present on admission. Will get wound care consult. 05-19-2023 awaiting wound care consult.  Hyperkalemia 05-18-2023 K 3.6 today. Was 5.2 yesterday. Resolved.  Elevated troponin I level 05-18-2023 not ACS. Likely due to acute CHF. 05-19-2023 stable  Dementia (HCC) 05-18-2023 chronic. 05-19-2023 chronic.  Stage 3b chronic kidney disease (HCC) - baseline Scr 1.2-1.5 05-18-2023 baseline Scr 1.2-1.5 05-19-2023 Scr increased to 2.13. consistent with AKI from overdiuresis. Hold lasix for now. Repeat BMP in AM. 02-16-205 Scr 2.87. likely some ATN from hypotensive episode 2 nights ago. BP is  stable now. I don't think aggressively hydrating him is worth the risk of CHF exacerbation. Discussed with pt's son Franky Macho who is 4 y/o and pt's sole caregiver. Discussed that pt is not a HD candidate. He agrees.  Wound, open, foot with tendon involvement, right, initial encounter 05-20-2023. Present on admission Scaly skin noted on the dorsal left foot; however has a wound on the dorsal right foot with exposed tendon. He is not  followed that I can tell by podiatry or wound care center.  Wound type: full thickness ulceration left foot  Pressure Injury POA: NA Measurement: see nursing flow sheets  Wound ZOX:WRUEAV tendon centrally  Drainage (amount, consistency, odor) scant Periwound: intact  Dressing procedure/placement/frequency: Cleanse with saline, pat dry Apply hydrogel (Lawson# [phone]), top with dry dressing. Apply daily  Debility 05-20-2023. Son would like pt to go to SNF at discharge. [facility] is preferred place. Will place PT consult and TOC consult.  DNR (do not resuscitate)/DNI(Do Not Intubate)      DVT prophylaxis: enoxaparin (LOVENOX) injection 30 mg Start: 05/18/23 1000    Code Status: Limited: Do not attempt resuscitation (DNR) -DNR-LIMITED -Do Not Intubate/DNI  Family Communication: discussed by phone with son Franky Macho. He states he is 52 yo and pt's sole caregiver. Son does hire help 5 days a week but son still has to manage pt at night and on weekends. Pt has no other family to help.  Son wants pt to go to SNF at discharge. Prefers [facility]  Disposition Plan: SNF Reason for continuing need for hospitalization: medically stable for DC.  Objective: Vitals:   05/19/23 2040 05/20/23 0414 05/20/23 0813 05/20/23 1031  BP: 106/67 93/69 115/77 112/78  Pulse: 78 73 72 77  Resp: 17 18 18 18   Temp: 98.4 F (36.9 C) 97.8 F (36.6 C) 97.6 F (36.4 C) 97.7 F (36.5 C)  TempSrc: Oral Oral Oral Axillary  SpO2: 97% 94% 99% 97%  Weight:  75.3 kg    Height:        Intake/Output Summary (Last 24 hours) at 05/20/2023 1346 Last data filed at 05/20/2023 0500 Gross per 24 hour  Intake 666.49 ml  Output 130 ml  Net 536.49 ml   Filed Weights   05/18/23 1403 05/19/23 0500 05/20/23 0414  Weight: 72.6 kg 73.5 kg 75.3 kg    Examination:  Physical Exam Vitals and nursing note reviewed.  Constitutional:      Comments: Awake. Chronically ill appearing Looks better than yesterday. Sitting up in  chair On RA. No distress  HENT:     Head: Normocephalic and atraumatic.  Cardiovascular:     Rate and Rhythm: Normal rate and regular rhythm.  Pulmonary:     Effort: Pulmonary effort is normal. No respiratory distress.     Breath sounds: Normal breath sounds. No wheezing.  Abdominal:     General: Bowel sounds are normal. There is no distension.     Palpations: Abdomen is soft.  Musculoskeletal:     Right lower leg: No edema.     Left lower leg: No edema.  Skin:    General: Skin is warm and dry.     Capillary Refill: Capillary refill takes less than 2 seconds.  Neurological:     Mental Status: He is disoriented.   Data Reviewed: I have personally reviewed following labs and imaging studies  CBC: Recent Labs  Lab 05/17/23 1723 05/20/23 0350  WBC 3.0* 4.2  NEUTROABS 2.0 2.4  HGB 13.0 12.0*  HCT 43.0  39.2  MCV 106.4* 104.5*  PLT 78* 77*   Basic Metabolic Panel: Recent Labs  Lab 05/17/23 1723 05/18/23 0338 05/19/23 0236 05/20/23 0350  NA 135 141 137 138  K 5.2* 3.6 4.5 4.9  CL 104 115* 104 103  CO2 24 20* 22 23  GLUCOSE 67* 95 148* 118*  BUN 44* 40* 45* 55*  CREATININE 1.69* 1.28* 2.13* 2.87*  CALCIUM 9.4 7.4* 9.2 9.1  MG  --  1.6* 2.0 1.9  PHOS  --  3.4  --   --    GFR: Estimated Creatinine Clearance: 14.8 mL/min (A) (by C-G formula based on SCr of 2.87 mg/dL (H)). Liver Function Tests: Recent Labs  Lab 05/17/23 1723 05/19/23 0236 05/20/23 0350  AST 35 28 74*  ALT 25 21 22   ALKPHOS 72 62 56  BILITOT 0.8 1.2 1.3*  PROT 7.3 6.5 6.5  ALBUMIN 4.0 3.7 3.6   BNP (last 3 results) Recent Labs    03/27/23 0140 05/17/23 1723 05/19/23 0236  BNP 1,694.3* 2,307.1* 1,447.0*   CBG: Recent Labs  Lab 05/19/23 0127  GLUCAP 173*   Thyroid Function Tests: Recent Labs    05/18/23 0338  TSH 1.603   Anemia Panel: Recent Labs    05/18/23 0338  VITAMINB12 982*   Scheduled Meds:  Chlorhexidine Gluconate Cloth  6 each Topical Daily   enoxaparin  (LOVENOX) injection  30 mg Subcutaneous Q24H   melatonin  6 mg Oral QHS   metoprolol succinate  25 mg Oral Daily   midodrine  2.5 mg Oral TID WC   sodium chloride flush  3 mL Intravenous Q12H   tamsulosin  0.4 mg Oral Daily   Continuous Infusions:   LOS: 3 days   Time spent: 50 minutes  Carollee Herter, DO  [facility]  05/20/2023, 1:46 PM

## 2023-05-20 NOTE — Assessment & Plan Note (Signed)
05-20-2023. Son would like pt to go to SNF at discharge. [facility] is preferred place. Will place PT consult and TOC consult.  05-22-2023 DC to SNF. [facility] as requested by son John Rose.

## 2023-05-20 NOTE — Plan of Care (Signed)

## 2023-05-21 DIAGNOSIS — I5023 Acute on chronic systolic (congestive) heart failure: Secondary | ICD-10-CM | POA: Diagnosis not present

## 2023-05-21 DIAGNOSIS — J90 Pleural effusion, not elsewhere classified: Secondary | ICD-10-CM | POA: Diagnosis not present

## 2023-05-21 DIAGNOSIS — S91301A Unspecified open wound, right foot, initial encounter: Secondary | ICD-10-CM | POA: Diagnosis not present

## 2023-05-21 DIAGNOSIS — F0390 Unspecified dementia without behavioral disturbance: Secondary | ICD-10-CM | POA: Diagnosis not present

## 2023-05-21 LAB — BASIC METABOLIC PANEL
Anion gap: 13 (ref 5–15)
BUN: 56 mg/dL — ABNORMAL HIGH (ref 8–23)
CO2: 19 mmol/L — ABNORMAL LOW (ref 22–32)
Calcium: 8.9 mg/dL (ref 8.9–10.3)
Chloride: 103 mmol/L (ref 98–111)
Creatinine, Ser: 2.41 mg/dL — ABNORMAL HIGH (ref 0.61–1.24)
GFR, Estimated: 24 mL/min — ABNORMAL LOW (ref >60)
Glucose, Bld: 100 mg/dL — ABNORMAL HIGH (ref 70–99)
Potassium: 4.5 mmol/L (ref 3.5–5.1)
Sodium: 135 mmol/L (ref 135–145)

## 2023-05-21 NOTE — Plan of Care (Signed)
 ?  Problem: Clinical Measurements: ?Goal: Will remain free from infection ?Outcome: Progressing ?  ?

## 2023-05-21 NOTE — NC FL2 (Signed)
[place] MEDICAID FL2 LEVEL OF CARE FORM     IDENTIFICATION  Patient Name: John Rose Birthdate: 08/02/1922 Sex: male Admission Date (Current Location): 05/17/2023  [facility] and [place] Number:      Facility and Address:  [facility],  [address], [place] [address]      Provider Number: [id number]  Attending Physician Name and Address:  Carollee Herter, DO  Relative Name and Phone Number:  Benjie, Ricketson (Son)  [phone] [facility])    Current Level of Care: Hospital Recommended Level of Care: Skilled Nursing Facility Prior Approval Number:    Date Approved/Denied:   PASRR Number: [id number]  Discharge Plan: SNF    Current Diagnoses: Patient Active Problem List   Diagnosis Date Noted   Debility 05/20/2023   Wound, open, foot with tendon involvement, right, initial encounter 05/20/2023   Elevated troponin I level 05/18/2023   Hyperkalemia 05/18/2023   DNR (do not resuscitate)/DNI(Do Not Intubate) 05/18/2023   Multiple open wounds of foot 05/18/2023   Acute on chronic systolic CHF (congestive heart failure) (HCC) 05/17/2023   BPH (benign prostatic hyperplasia) 12/13/2022   Acute on chronic combined systolic and diastolic CHF (congestive heart failure) (HCC) 12/13/2022   Acute on chronic diastolic (congestive) heart failure (HCC) 12/10/2022   Mixed hyperlipidemia 09/07/2022   Stage 3b chronic kidney disease (HCC) - baseline Scr 1.2-1.5 09/07/2022   PAC (premature atrial contraction) 09/07/2022   Dementia (HCC) 09/07/2022   Acute on chronic heart failure with preserved ejection fraction (HCC) 12/14/2021   Aortic atherosclerosis (HCC) 08/04/2021   Bilateral pleural effusion 08/04/2021   Pain of left hip joint 04/13/2021   Lumbar spondylosis 11/10/2016    Orientation RESPIRATION BLADDER Height & Weight     Self  Normal Incontinent Weight: 166 lb 10.7 oz (75.6 kg) Height:  5\' 10"  (177.8 cm)  BEHAVIORAL SYMPTOMS/MOOD NEUROLOGICAL BOWEL NUTRITION  STATUS      Continent Diet (renal)  AMBULATORY STATUS COMMUNICATION OF NEEDS Skin   Extensive Assist Verbally Other (Comment) (foot anterior left)                       Personal Care Assistance Level of Assistance  Bathing, Feeding, Dressing Bathing Assistance: Limited assistance Feeding assistance: Independent Dressing Assistance: Limited assistance     Functional Limitations Info  Sight, Hearing, Speech Sight Info: Impaired (glasses) Hearing Info: Adequate Speech Info: Adequate    SPECIAL CARE FACTORS FREQUENCY  OT (By licensed OT), PT (By licensed PT), Speech therapy     PT Frequency: 5 x a week OT Frequency: 5x a week     Speech Therapy Frequency: 5 x a week      Contractures Contractures Info: Not present    Additional Factors Info  Code Status, Allergies Code Status Info: DNR Allergies Info: NKA           Current Medications (05/21/2023):  This is the current hospital active medication list Current Facility-Administered Medications  Medication Dose Route Frequency Provider Last Rate Last Admin   acetaminophen (TYLENOL) suppository 650 mg  650 mg Rectal Q4H PRN Carollee Herter, DO   650 mg at 05/19/23 1411   acetaminophen (TYLENOL) tablet 1,000 mg  1,000 mg Oral Q6H PRN Dolly Rias, MD       albuterol (PROVENTIL) (2.5 MG/3ML) 0.083% nebulizer solution 2.5 mg  2.5 mg Nebulization Q4H PRN Dolly Rias, MD       Chlorhexidine Gluconate Cloth 2 % PADS 6 each  6 each Topical Daily Imogene Burn,  Eric, DO   6 each at 05/21/23 0950   enoxaparin (LOVENOX) injection 30 mg  30 mg Subcutaneous Q24H Dolly Rias, MD   30 mg at 05/21/23 0944   melatonin tablet 6 mg  6 mg Oral QHS Dolly Rias, MD   6 mg at 05/20/23 2156   metoprolol succinate (TOPROL-XL) 24 hr tablet 25 mg  25 mg Oral Daily Carollee Herter, DO   25 mg at 05/21/23 0944   midodrine (PROAMATINE) tablet 2.5 mg  2.5 mg Oral TID WC Carollee Herter, DO   2.5 mg at 05/21/23 1212   ondansetron (ZOFRAN) injection 4 mg   4 mg Intravenous Q6H PRN Segars, Christiane Ha, MD       polyethylene glycol (MIRALAX / GLYCOLAX) packet 17 g  17 g Oral Daily PRN Segars, Christiane Ha, MD       sodium chloride flush (NS) 0.9 % injection 3 mL  3 mL Intravenous Q12H Dolly Rias, MD   3 mL at 05/21/23 0944   tamsulosin (FLOMAX) capsule 0.4 mg  0.4 mg Oral Daily Dolly Rias, MD   0.4 mg at 05/21/23 1191     Discharge Medications: Please see discharge summary for a list of discharge medications.  Relevant Imaging Results:  Relevant Lab Results:   Additional Information SSN:[id number]  Valentina Shaggy Jasleen Riepe, LCSW

## 2023-05-21 NOTE — Progress Notes (Signed)
PT Cancellation Note  Patient Details Name: John Rose MRN: [id number] DOB: 05-21-1922   Cancelled Treatment:    Reason Eval/Treat Not Completed: Other (comment) Patient is eating, will check back later this AM.  Blanchard Kelch PT Acute Rehabilitation Services Office [phone]    Rada Hay 05/21/2023, 9:41 AM

## 2023-05-21 NOTE — TOC Initial Note (Addendum)
Transition of Care [facility]) - Initial/Assessment Note    Patient Details  Name: John Rose MRN: [id number] Date of Birth: [birth year]  Transition of Care [facility]) CM/SW Contact:    Larrie Kass, LCSW Phone Number: 05/21/2023, 9:38 AM  Clinical Narrative:                  Noted PT eval recommended. TOC to follow     ADDEN 2:30pm CSW spoke with the pt's son, Franky Macho, who is the pt's legal guardian. CSW discussed recommendations for SNF placement. Pt's son is agreeable to the placement and would prefer [facility], as the pt was there in the past. CSW explained the process, noting that the pt would not need insurance authorization. TOC to follow.   Barriers to Discharge: Continued Medical Work up   Patient Goals and CMS Choice            Expected Discharge Plan and Services                                              Prior Living Arrangements/Services                       Activities of Daily Living   ADL Screening (condition at time of admission) Independently performs ADLs?: No Does the patient have a NEW difficulty with bathing/dressing/toileting/self-feeding that is expected to last >3 days?: Yes (Initiates electronic notice to provider for possible OT consult) Does the patient have a NEW difficulty with getting in/out of bed, walking, or climbing stairs that is expected to last >3 days?: No Does the patient have a NEW difficulty with communication that is expected to last >3 days?: Yes (Initiates electronic notice to provider for possible SLP consult) Is the patient deaf or have difficulty hearing?: No Does the patient have difficulty seeing, even when wearing glasses/contacts?: No Does the patient have difficulty concentrating, remembering, or making decisions?: Yes  Permission Sought/Granted                  Emotional Assessment              Admission diagnosis:  Elevated troponin [R79.89] Acute exacerbation of CHF  (congestive heart failure) (HCC) [I50.9] Acute on chronic congestive heart failure, unspecified heart failure type (HCC) [I50.9] Patient Active Problem List   Diagnosis Date Noted   Debility 05/20/2023   Wound, open, foot with tendon involvement, right, initial encounter 05/20/2023   Elevated troponin I level 05/18/2023   Hyperkalemia 05/18/2023   DNR (do not resuscitate)/DNI(Do Not Intubate) 05/18/2023   Multiple open wounds of foot 05/18/2023   Acute on chronic systolic CHF (congestive heart failure) (HCC) 05/17/2023   BPH (benign prostatic hyperplasia) 12/13/2022   Acute on chronic combined systolic and diastolic CHF (congestive heart failure) (HCC) 12/13/2022   Acute on chronic diastolic (congestive) heart failure (HCC) 12/10/2022   Mixed hyperlipidemia 09/07/2022   Stage 3b chronic kidney disease (HCC) - baseline Scr 1.2-1.5 09/07/2022   PAC (premature atrial contraction) 09/07/2022   Dementia (HCC) 09/07/2022   Acute on chronic heart failure with preserved ejection fraction (HCC) 12/14/2021   Aortic atherosclerosis (HCC) 08/04/2021   Bilateral pleural effusion 08/04/2021   Pain of left hip joint 04/13/2021   Lumbar spondylosis 11/10/2016   PCP:  Christell Constant, MD Pharmacy:   [facility] [phone], [place] -  [address] AT [facility] [address] [place] [address] Phone: [phone] Fax: [phone]     Social Drivers of Health (SDOH) Social History: SDOH Screenings   Food Insecurity: No Food Insecurity (05/18/2023)  Housing: Low Risk  (05/18/2023)  Transportation Needs: No Transportation Needs (05/18/2023)  Utilities: Not At Risk (05/18/2023)  Social Connections: Moderately Isolated (05/18/2023)  Tobacco Use: Low Risk  (05/17/2023)   SDOH Interventions:     Readmission Risk Interventions     No data to display

## 2023-05-21 NOTE — Progress Notes (Signed)
PROGRESS NOTE    John Rose  ZOX:[id number] DOB: Nov 16, 1922 DOA: 05/17/2023 PCP: Christell Constant, MD  Subjective: Pt seen and examined. Sitting up in bed eating breakfast. On RA.   Hospital Course: HPI: History limited due to patient's dementia, obtained by chart review/ED history  John Rose is a 89 y.o. male with hx of dementia, heart failure with reduced ejection fraction, CAD 3B, BPH, who presents with progressive dyspnea. On my interview patient is vague about reasons for coming into the reporting recent shortness of breath and "heart problem".    Per ED note:  " He presents with increased shortness of breath and weight gain. History is obtained from the patient and his son. He was seen on January 20 at the CHF clinic. At that time he was taking Lasix 80 mg a day. However his labs came back with an increased creatinine. They decrease his Lasix to 40 mg 3 times a week. He has been on the 2 mg dose. However over the last week he has had some increased shortness of breath and fatigue on exertion. He denies any associated chest discomfort. He has had some increased leg swelling. The son's been giving him his 80 mg dose for the last 2 days but he has not noticed any improvement in symptoms. He does not have any increased urination. No fevers. No cough or cold symptoms. "   Significant Events: Admitted 05/17/2023 for acute on chronic systolic CHF exacerbation   Significant Labs: WBC 3.0, HgB 13, Plt 78 BNP 2307 Na 135, K 5.2, BUN 44, Scr 1.69, glu 67  Significant Imaging Studies: CXR Cardiomegaly with vascular congestion and moderate bilateral effusions. Bibasilar consolidations may be due to atelectasis or pneumonia.  Antibiotic Therapy: Anti-infectives (From admission, onward)    None       Procedures:   Consultants:     Assessment and Plan: * Acute on chronic systolic CHF (congestive heart failure) (HCC) 05-18-2023 based on clinical findings, he has  diuresed well with IV lasix 80 mg daily.  He is lying flat in bed. On RA. No distress. His LE edema is only trace today. 05-19-2023 based on last night's bout of hypotension, pt was probably overdiuresed. BNP 1447 today. Was 2307 on admission. Hold lasix for today. Continue with Toprol-xl. 05-20-2023 BP has improved with IVF overnight. Still on RA. Still stop further IVF as not to send him back into acute CHF. 05-21-2023 stable. On Toprol-XL 25 mg qday. Not on ARB/ACEI, entresto due to CKD. Scr improved to 2.4. no LE edema. Continue to hold lasix. Probably restart lasix tomorrow.  Bilateral pleural effusion 05-18-2023 pt lying flat in bed. No edema. Will decide tomorrow if thoracentesis would be warranted. At this point, I don't think so since he is on RA and lying flat in bed. 05-19-2023 on RA and lying flat in bed. Risks of thoracentesis not worth it as he is on RA and able to lie flat in bed. 05-20-2023 discussed with pt's son Franky Macho. Thoracentesis too risky as pt is on RA and able to lie flat in bed.  05-21-2023 stable. No thoracentesis as pt is asymptomatic. On RA. Able to lie flat in bed.  Multiple open wounds of foot 05-18-2023 present on admission. Will get wound care consult. 05-19-2023 awaiting wound care consult.  DNR (do not resuscitate)/DNI(Do Not Intubate)      Hyperkalemia 05-18-2023 K 3.6 today. Was 5.2 yesterday. Resolved.  Elevated troponin I level 05-18-2023 not ACS. Likely due to acute CHF.  05-19-2023 stable  Dementia (HCC) 05-18-2023 chronic. 05-19-2023 chronic. 05-21-2023 stable. Will need SNF at discharge.  Stage 3b chronic kidney disease (HCC) - baseline Scr 1.2-1.5 05-18-2023 baseline Scr 1.2-1.5 05-19-2023 Scr increased to 2.13. consistent with AKI from overdiuresis. Hold lasix for now. Repeat BMP in AM. 02-16-205 Scr 2.87. likely some ATN from hypotensive episode 2 nights ago. BP is stable now. I don't think aggressively hydrating him is worth the risk  of CHF exacerbation. Discussed with pt's son Franky Macho who is 44 y/o and pt's sole caregiver. Discussed that pt is not a HD candidate. He agrees.  05-21-2023 Scr down to 2.4 today.  Wound, open, foot with tendon involvement, right, initial encounter 05-20-2023. Present on admission Scaly skin noted on the dorsal left foot; however has a wound on the dorsal right foot with exposed tendon. He is not followed that I can tell by podiatry or wound care center.  Wound type: full thickness ulceration left foot  Pressure Injury POA: NA Measurement: see nursing flow sheets  Wound VHQ:IONGEX tendon centrally  Drainage (amount, consistency, odor) scant Periwound: intact  Dressing procedure/placement/frequency: Cleanse with saline, pat dry Apply hydrogel (Lawson# [phone]), top with dry dressing. Apply daily  Debility 05-20-2023. Son would like pt to go to SNF at discharge. [facility] is preferred place. Will place PT consult and TOC consult.  DVT prophylaxis: enoxaparin (LOVENOX) injection 30 mg Start: 05/18/23 1000    Code Status: Limited: Do not attempt resuscitation (DNR) -DNR-LIMITED -Do Not Intubate/DNI  Family Communication: no family at bedside. Spoke with son Linwood yesterday. 05-20-2023 Disposition Plan: SNF Reason for continuing need for hospitalization: medically stable for DC to Snf.  Objective: Vitals:   05/20/23 1031 05/20/23 2133 05/21/23 0500 05/21/23 0605  BP: 112/78 130/86  (!) 120/92  Pulse: 77 73  75  Resp: 18 16  19   Temp: 97.7 F (36.5 C) 98.5 F (36.9 C)  98.4 F (36.9 C)  TempSrc: Axillary Oral  Oral  SpO2: 97% 98%  100%  Weight:   75.6 kg   Height:        Intake/Output Summary (Last 24 hours) at 05/21/2023 0934 Last data filed at 05/20/2023 1820 Gross per 24 hour  Intake 120 ml  Output 75 ml  Net 45 ml   Filed Weights   05/19/23 0500 05/20/23 0414 05/21/23 0500  Weight: 73.5 kg 75.3 kg 75.6 kg    Examination:  Physical Exam Vitals and nursing note  reviewed.  Constitutional:      General: He is not in acute distress.    Appearance: He is not toxic-appearing or diaphoretic.  HENT:     Head: Normocephalic and atraumatic.     Nose: Nose normal.  Eyes:     General: No scleral icterus. Cardiovascular:     Rate and Rhythm: Normal rate and regular rhythm.  Pulmonary:     Effort: Pulmonary effort is normal.     Breath sounds: Normal breath sounds.  Abdominal:     General: Abdomen is flat. Bowel sounds are normal.     Palpations: Abdomen is soft.  Musculoskeletal:     Right lower leg: No edema.     Left lower leg: No edema.  Skin:    General: Skin is warm and dry.     Capillary Refill: Capillary refill takes less than 2 seconds.  Neurological:     Mental Status: He is alert. He is disoriented.     Data Reviewed: I have personally reviewed following labs and  imaging studies  CBC: Recent Labs  Lab 05/17/23 1723 05/20/23 0350  WBC 3.0* 4.2  NEUTROABS 2.0 2.4  HGB 13.0 12.0*  HCT 43.0 39.2  MCV 106.4* 104.5*  PLT 78* 77*   Basic Metabolic Panel: Recent Labs  Lab 05/17/23 1723 05/18/23 0338 05/19/23 0236 05/20/23 0350 05/21/23 0354  NA 135 141 137 138 135  K 5.2* 3.6 4.5 4.9 4.5  CL 104 115* 104 103 103  CO2 24 20* 22 23 19*  GLUCOSE 67* 95 148* 118* 100*  BUN 44* 40* 45* 55* 56*  CREATININE 1.69* 1.28* 2.13* 2.87* 2.41*  CALCIUM 9.4 7.4* 9.2 9.1 8.9  MG  --  1.6* 2.0 1.9  --   PHOS  --  3.4  --   --   --    GFR: Estimated Creatinine Clearance: 17.7 mL/min (A) (by C-G formula based on SCr of 2.41 mg/dL (H)). Liver Function Tests: Recent Labs  Lab 05/17/23 1723 05/19/23 0236 05/20/23 0350  AST 35 28 74*  ALT 25 21 22   ALKPHOS 72 62 56  BILITOT 0.8 1.2 1.3*  PROT 7.3 6.5 6.5  ALBUMIN 4.0 3.7 3.6   BNP (last 3 results) Recent Labs    03/27/23 0140 05/17/23 1723 05/19/23 0236  BNP 1,694.3* 2,307.1* 1,447.0*   CBG: Recent Labs  Lab 05/19/23 0127  GLUCAP 173*   Scheduled Meds:   Chlorhexidine Gluconate Cloth  6 each Topical Daily   enoxaparin (LOVENOX) injection  30 mg Subcutaneous Q24H   melatonin  6 mg Oral QHS   metoprolol succinate  25 mg Oral Daily   midodrine  2.5 mg Oral TID WC   sodium chloride flush  3 mL Intravenous Q12H   tamsulosin  0.4 mg Oral Daily   Continuous Infusions:   LOS: 4 days   Time spent: 40 minutes  Carollee Herter, DO  [facility]  05/21/2023, 9:34 AM

## 2023-05-21 NOTE — Evaluation (Signed)
Physical Therapy Evaluation Patient Details Name: John Rose MRN: [id number] DOB: 07-31-1922 Today's Date: 05/21/2023  History of Present Illness  Patient is a 89 year old male who presented on 2/13 with progressive dyspnea. Patient was admitted with acute on chronic systolic CHF, bilateral pleural effusion, multiple open foot wounds, elevated troponin. PMH: dementia, CKD III,  Clinical Impression  Pt admitted with above diagnosis.  Pt currently with functional limitations due to the deficits listed below (see PT Problem List). Pt will benefit from acute skilled PT to increase their independence and safety with mobility to allow discharge.    The  patient required   much time to engage in therapy, states that he is  wanting a nap.  Patient  required mod  support to move to sitting and +2 max support to stand and step to the recliner using the RW. Poor posture, knees flexed when standing..  No family present so unsure of baseline  functional status.   Patient will benefit from continued inpatient follow up therapy, <3 hours/day.            If plan is discharge home, recommend the following: Two people to help with walking and/or transfers;A lot of help with bathing/dressing/bathroom;Assist for transportation;Help with stairs or ramp for entrance   Can travel by private vehicle        Equipment Recommendations None recommended by PT  Recommendations for Other Services       Functional Status Assessment Patient has had a recent decline in their functional status and demonstrates the ability to make significant improvements in function in a reasonable and predictable amount of time.     Precautions / Restrictions Precautions Precautions: Fall Recall of Precautions/Restrictions: Impaired Restrictions Weight Bearing Restrictions Per Provider Order: No      Mobility  Bed Mobility   Bed Mobility: Supine to Sit     Supine to sit: Mod assist     General bed mobility  comments: with increased time, multimodal cues and  extra time to initiate mobility,    Transfers Overall transfer level: Needs assistance Equipment used: Rolling walker (2 wheels) Transfers: Sit to/from Stand, Bed to chair/wheelchair/BSC Sit to Stand: +2 physical assistance, +2 safety/equipment, Max assist   Step pivot transfers: +2 safety/equipment, +2 physical assistance, From elevated surface, Max assist       General transfer comment: Attempted to stand multiple times, patient not straghtening knees and sits back down. + 2 max to rised and  pivot to recliner, Knees flexed and trunk flexed.    Ambulation/Gait                  Stairs            Wheelchair Mobility     Tilt Bed    Modified Rankin (Stroke Patients Only)       Balance Overall balance assessment: Needs assistance Sitting-balance support: Bilateral upper extremity supported, Feet supported Sitting balance-Leahy Scale: Fair     Standing balance support: During functional activity, Bilateral upper extremity supported, Reliant on assistive device for balance Standing balance-Leahy Scale: Poor                               Pertinent Vitals/Pain Pain Assessment Breathing: normal Negative Vocalization: none Facial Expression: smiling or inexpressive Body Language: relaxed    Home Living Family/patient expects to be discharged to:: Private residence Living Arrangements: Alone Available Help at Discharge: Personal care attendant  Additional Comments: Pt poor historian, unable to get home set up at this time    Prior Function Prior Level of Function : Patient poor historian/Family not available             Mobility Comments: unsure, no family present ADLs Comments: unsure     Extremity/Trunk Assessment   Upper Extremity Assessment Upper Extremity Assessment: Right hand dominant    Lower Extremity Assessment Lower Extremity Assessment:  Generalized weakness    Cervical / Trunk Assessment Cervical / Trunk Assessment: Normal  Communication   Communication Communication: Impaired Factors Affecting Communication: Hearing impaired    Cognition Arousal: Alert Behavior During Therapy: Flat affect   PT - Cognitive impairments: Memory, Awareness, Attention, No family/caregiver present to determine baseline, History of cognitive impairments, Orientation   Orientation impairments: Place, Time, Situation                     Following commands: Impaired Following commands impaired: Follows one step commands inconsistently     Cueing Cueing Techniques: Verbal cues, Tactile cues     General Comments      Exercises     Assessment/Plan    PT Assessment Patient needs continued PT services  PT Problem List Decreased strength;Decreased mobility;Decreased knowledge of precautions;Decreased activity tolerance;Decreased cognition;Decreased balance       PT Treatment Interventions DME instruction;Therapeutic activities;Gait training;Therapeutic exercise;Patient/family education;Functional mobility training    PT Goals (Current goals can be found in the Care Plan section)  Acute Rehab PT Goals PT Goal Formulation: Patient unable to participate in goal setting Time For Goal Achievement: 06/04/23 Potential to Achieve Goals: Fair    Frequency Min 1X/week     Co-evaluation               AM-PAC PT "6 Clicks" Mobility  Outcome Measure Help needed turning from your back to your side while in a flat bed without using bedrails?: A Lot Help needed moving from lying on your back to sitting on the side of a flat bed without using bedrails?: A Lot Help needed moving to and from a bed to a chair (including a wheelchair)?: A Lot Help needed standing up from a chair using your arms (e.g., wheelchair or bedside chair)?: A Lot Help needed to walk in hospital room?: Total Help needed climbing 3-5 steps with a railing? :  Total 6 Click Score: 10    End of Session Equipment Utilized During Treatment: Gait belt Activity Tolerance: Patient limited by fatigue Patient left: in chair;with call bell/phone within reach;with chair alarm set;with nursing/sitter in room Nurse Communication: Mobility status PT Visit Diagnosis: Unsteadiness on feet (R26.81);Muscle weakness (generalized) (M62.81);Difficulty in walking, not elsewhere classified (R26.2)    Time: 1134-      Charges:   PT Evaluation $PT Eval Low Complexity: 1 Low PT Treatments $Therapeutic Activity: 23-37 mins PT General Charges $$ ACUTE PT VISIT: 1 Visit         Blanchard Kelch PT Acute Rehabilitation Services Office [phone] Weekend [phone]    Rada Hay 05/21/2023, 1:54 PM

## 2023-05-22 DIAGNOSIS — F0390 Unspecified dementia without behavioral disturbance: Secondary | ICD-10-CM | POA: Diagnosis not present

## 2023-05-22 DIAGNOSIS — E875 Hyperkalemia: Secondary | ICD-10-CM | POA: Diagnosis not present

## 2023-05-22 DIAGNOSIS — I5023 Acute on chronic systolic (congestive) heart failure: Secondary | ICD-10-CM | POA: Diagnosis not present

## 2023-05-22 DIAGNOSIS — J90 Pleural effusion, not elsewhere classified: Secondary | ICD-10-CM | POA: Diagnosis not present

## 2023-05-22 MED ORDER — POTASSIUM CHLORIDE CRYS ER 20 MEQ PO TBCR: 20.0000 meq | EXTENDED_RELEASE_TABLET | ORAL | Status: DC

## 2023-05-22 MED ORDER — FUROSEMIDE 20 MG PO TABS
20.0000 mg | ORAL_TABLET | ORAL | Status: DC
Start: 2023-05-22 — End: 2023-07-10

## 2023-05-22 MED ORDER — MELATONIN 3 MG PO TABS: 6.0000 mg | ORAL_TABLET | Freq: Every day | ORAL | Status: DC

## 2023-05-22 NOTE — Plan of Care (Signed)
  Problem: Education: Goal: Knowledge of General Education information will improve Description: Including pain rating scale, medication(s)/side effects and non-pharmacologic comfort measures Outcome: Progressing   Problem: Clinical Measurements: Goal: Ability to maintain clinical measurements within normal limits will improve Outcome: Progressing   Problem: Safety: Goal: Ability to remain free from injury will improve Outcome: Progressing   

## 2023-05-22 NOTE — Progress Notes (Signed)
PROGRESS NOTE    John Rose  ZOX:[id number] DOB: 05-06-1922 DOA: 05/17/2023 PCP: Christell Constant, MD  Subjective: Pt seen and examined. Pt lying flat in bed. On RA. No distress CM has secured bed at [facility]. Son's first choice.   Hospital Course: HPI: History limited due to patient's dementia, obtained by chart review/ED history  John Rose is a 89 y.o. male with hx of dementia, heart failure with reduced ejection fraction, CAD 3B, BPH, who presents with progressive dyspnea. On my interview patient is vague about reasons for coming into the reporting recent shortness of breath and "heart problem".    Per ED note:  " He presents with increased shortness of breath and weight gain. History is obtained from the patient and his son. He was seen on January 20 at the CHF clinic. At that time he was taking Lasix 80 mg a day. However his labs came back with an increased creatinine. They decrease his Lasix to 40 mg 3 times a week. He has been on the 2 mg dose. However over the last week he has had some increased shortness of breath and fatigue on exertion. He denies any associated chest discomfort. He has had some increased leg swelling. The son's been giving him his 80 mg dose for the last 2 days but he has not noticed any improvement in symptoms. He does not have any increased urination. No fevers. No cough or cold symptoms. "   Significant Events: Admitted 05/17/2023 for acute on chronic systolic CHF exacerbation   Significant Labs: WBC 3.0, HgB 13, Plt 78 BNP 2307 Na 135, K 5.2, BUN 44, Scr 1.69, glu 67  Significant Imaging Studies: CXR Cardiomegaly with vascular congestion and moderate bilateral effusions. Bibasilar consolidations may be due to atelectasis or pneumonia.  Antibiotic Therapy: Anti-infectives (From admission, onward)    None       Procedures:   Consultants:     Assessment and Plan: * Acute on chronic systolic CHF (congestive heart failure)  (HCC) 05-18-2023 based on clinical findings, he has diuresed well with IV lasix 80 mg daily.  He is lying flat in bed. On RA. No distress. His LE edema is only trace today. 05-19-2023 based on last night's bout of hypotension, pt was probably overdiuresed. BNP 1447 today. Was 2307 on admission. Hold lasix for today. Continue with Toprol-xl. 05-20-2023 BP has improved with IVF overnight. Still on RA. Still stop further IVF as not to send him back into acute CHF. 05-21-2023 stable. On Toprol-XL 25 mg qday. Not on ARB/ACEI, entresto due to CKD. Scr improved to 2.4. no LE edema. Continue to hold lasix. Probably restart lasix tomorrow.  05-22-2023 stable for DC to SNF. Due to CKD, not on ARB/ACEI or entresto.  Restart lasix at 20 mg every other day.  And prn LE edema. Continue Toprol-XL 25 mg qday.  Bilateral pleural effusion 05-18-2023 pt lying flat in bed. No edema. Will decide tomorrow if thoracentesis would be warranted. At this point, I don't think so since he is on RA and lying flat in bed. 05-19-2023 on RA and lying flat in bed. Risks of thoracentesis not worth it as he is on RA and able to lie flat in bed. 05-20-2023 discussed with pt's son Franky Macho. Thoracentesis too risky as pt is on RA and able to lie flat in bed. 05-21-2023 stable. No thoracentesis as pt is asymptomatic. On RA. Able to lie flat in bed.  05-22-2023 on RA and able to lie flat in  bed without any distress. Risks of thoracentesis outweigh any potential benefit  Multiple open wounds of foot 05-18-2023 present on admission. Will get wound care consult. 05-19-2023 awaiting wound care consult.  05-22-2023 outpatient referral to podiatry for wound on the dorsal right foot with exposed tendon   DNR (do not resuscitate)/DNI(Do Not Intubate)      Hyperkalemia 05-18-2023 K 3.6 today. Was 5.2 yesterday. Resolved.  Elevated troponin I level 05-18-2023 not ACS. Likely due to acute CHF. 05-19-2023 stable  Dementia  (HCC) 05-18-2023 chronic. 05-19-2023 chronic. 05-21-2023 stable. Will need SNF at discharge.  05-22-2023 stable  Stage 3b chronic kidney disease (HCC) - baseline Scr 1.2-1.5 05-18-2023 baseline Scr 1.2-1.5 05-19-2023 Scr increased to 2.13. consistent with AKI from overdiuresis. Hold lasix for now. Repeat BMP in AM. 02-16-205 Scr 2.87. likely some ATN from hypotensive episode 2 nights ago. BP is stable now. I don't think aggressively hydrating him is worth the risk of CHF exacerbation. Discussed with pt's son Franky Macho who is 45 y/o and pt's sole caregiver. Discussed that pt is not a HD candidate. He agrees. 05-21-2023 Scr down to 2.4 today.  05-22-2023 stable  Wound, open, foot with tendon involvement, right, initial encounter 05-20-2023. Present on admission Scaly skin noted on the dorsal left foot; however has a wound on the dorsal right foot with exposed tendon. He is not followed that I can tell by podiatry or wound care center.  Wound type: full thickness ulceration left foot  Pressure Injury POA: NA Measurement: see nursing flow sheets  Wound ZOX:WRUEAV tendon centrally  Drainage (amount, consistency, odor) scant Periwound: intact  Dressing procedure/placement/frequency: Cleanse with saline, pat dry Apply hydrogel (Lawson# [phone]), top with dry dressing. Apply daily  Debility 05-20-2023. Son would like pt to go to SNF at discharge. [facility] is preferred place. Will place PT consult and TOC consult.  05-22-2023 DC to SNF. [facility] as requested by son Franky Macho.  DVT prophylaxis: enoxaparin (LOVENOX) injection 30 mg Start: 05/18/23 1000    Code Status: Limited: Do not attempt resuscitation (DNR) -DNR-LIMITED -Do Not Intubate/DNI  Family Communication: no family at bedside. CM has communicated with son that pt will be discharged to SNF today. Disposition Plan: SNF Reason for continuing need for hospitalization: medically stable for DC today.  Objective: Vitals:   05/21/23  1158 05/21/23 2056 05/22/23 0500 05/22/23 0538  BP: (!) 142/86 119/72  122/70  Pulse: 83 79  71  Resp: 18 19  19   Temp: 98.1 F (36.7 C) 98.5 F (36.9 C)  98.6 F (37 C)  TempSrc: Oral Oral  Oral  SpO2: 97% 97%  96%  Weight:   74.5 kg   Height:        Intake/Output Summary (Last 24 hours) at 05/22/2023 0942 Last data filed at 05/22/2023 0538 Gross per 24 hour  Intake 120 ml  Output 350 ml  Net -230 ml   Filed Weights   05/20/23 0414 05/21/23 0500 05/22/23 0500  Weight: 75.3 kg 75.6 kg 74.5 kg    Examination:  Physical Exam Vitals and nursing note reviewed.  Constitutional:      General: He is not in acute distress.    Appearance: He is not toxic-appearing or diaphoretic.     Comments: Lying flat in bed. On RA. No distress  HENT:     Head: Normocephalic and atraumatic.  Cardiovascular:     Rate and Rhythm: Normal rate and regular rhythm.  Pulmonary:     Effort: Pulmonary effort is normal.  No respiratory distress.     Breath sounds: Normal breath sounds.  Abdominal:     General: Bowel sounds are normal.     Palpations: Abdomen is soft.  Musculoskeletal:     Right lower leg: No edema.     Left lower leg: No edema.  Skin:    General: Skin is warm and dry.     Capillary Refill: Capillary refill takes less than 2 seconds.  Neurological:     Comments: Hard of hearing Pleasantly demented     Data Reviewed: I have personally reviewed following labs and imaging studies  CBC: Recent Labs  Lab 05/17/23 1723 05/20/23 0350  WBC 3.0* 4.2  NEUTROABS 2.0 2.4  HGB 13.0 12.0*  HCT 43.0 39.2  MCV 106.4* 104.5*  PLT 78* 77*   Basic Metabolic Panel: Recent Labs  Lab 05/17/23 1723 05/18/23 0338 05/19/23 0236 05/20/23 0350 05/21/23 0354  NA 135 141 137 138 135  K 5.2* 3.6 4.5 4.9 4.5  CL 104 115* 104 103 103  CO2 24 20* 22 23 19*  GLUCOSE 67* 95 148* 118* 100*  BUN 44* 40* 45* 55* 56*  CREATININE 1.69* 1.28* 2.13* 2.87* 2.41*  CALCIUM 9.4 7.4* 9.2 9.1 8.9   MG  --  1.6* 2.0 1.9  --   PHOS  --  3.4  --   --   --    GFR: Estimated Creatinine Clearance: 17.7 mL/min (A) (by C-G formula based on SCr of 2.41 mg/dL (H)). Liver Function Tests: Recent Labs  Lab 05/17/23 1723 05/19/23 0236 05/20/23 0350  AST 35 28 74*  ALT 25 21 22   ALKPHOS 72 62 56  BILITOT 0.8 1.2 1.3*  PROT 7.3 6.5 6.5  ALBUMIN 4.0 3.7 3.6   BNP (last 3 results) Recent Labs    03/27/23 0140 05/17/23 1723 05/19/23 0236  BNP 1,694.3* 2,307.1* 1,447.0*   CBG: Recent Labs  Lab 05/19/23 0127  GLUCAP 173*    Scheduled Meds:  Chlorhexidine Gluconate Cloth  6 each Topical Daily   enoxaparin (LOVENOX) injection  30 mg Subcutaneous Q24H   melatonin  6 mg Oral QHS   metoprolol succinate  25 mg Oral Daily   midodrine  2.5 mg Oral TID WC   sodium chloride flush  3 mL Intravenous Q12H   tamsulosin  0.4 mg Oral Daily   Continuous Infusions:   LOS: 5 days   Time spent: 40 minutes  Carollee Herter, DO  [facility]  05/22/2023, 9:42 AM

## 2023-05-22 NOTE — Discharge Summary (Addendum)
[facility] Physician Discharge Summary   Patient name: John Rose  Admit date:     05/17/2023  Discharge date: 05/22/2023  Attending Physician: Dolly Rias [[id number]]  Discharge Physician: Carollee Herter   PCP: Christell Constant, MD  Admitted From: Home  Disposition:   [facility]  Recommendations for Outpatient Follow-up:  Follow up with PCP in 1-2 weeks Outpatient referral to podiatry for right foot wound with exposed tendon  Home Health:No Equipment/Devices: None  Discharge Condition:Stable CODE STATUS:DNR/DNI Diet recommendation: Renal Fluid Restriction: 1800 ml/day  Hospital Summary: HPI: History limited due to patient's dementia, obtained by chart review/ED history  John Rose is a 89 y.o. male with hx of dementia, heart failure with reduced ejection fraction, CAD 3B, BPH, who presents with progressive dyspnea. On my interview patient is vague about reasons for coming into the reporting recent shortness of breath and "heart problem".    Per ED note:  " He presents with increased shortness of breath and weight gain. History is obtained from the patient and his son. He was seen on January 20 at the CHF clinic. At that time he was taking Lasix 80 mg a day. However his labs came back with an increased creatinine. They decrease his Lasix to 40 mg 3 times a week. He has been on the 2 mg dose. However over the last week he has had some increased shortness of breath and fatigue on exertion. He denies any associated chest discomfort. He has had some increased leg swelling. The son's been giving him his 80 mg dose for the last 2 days but he has not noticed any improvement in symptoms. He does not have any increased urination. No fevers. No cough or cold symptoms. "   Significant Events: Admitted 05/17/2023 for acute on chronic systolic CHF exacerbation   Significant Labs: WBC 3.0, HgB 13, Plt 78 BNP 2307 Na 135, K 5.2, BUN 44, Scr 1.69, glu 67  Significant  Imaging Studies: CXR Cardiomegaly with vascular congestion and moderate bilateral effusions. Bibasilar consolidations may be due to atelectasis or pneumonia.  Antibiotic Therapy: Anti-infectives (From admission, onward)    None       Procedures:   Consultants:   Hospital Course by Problem: * Acute on chronic systolic CHF (congestive heart failure) (HCC) 05-18-2023 based on clinical findings, he has diuresed well with IV lasix 80 mg daily.  He is lying flat in bed. On RA. No distress. His LE edema is only trace today. 05-19-2023 based on last night's bout of hypotension, pt was probably overdiuresed. BNP 1447 today. Was 2307 on admission. Hold lasix for today. Continue with Toprol-xl. 05-20-2023 BP has improved with IVF overnight. Still on RA. Still stop further IVF as not to send him back into acute CHF. 05-21-2023 stable. On Toprol-XL 25 mg qday. Not on ARB/ACEI, entresto due to CKD. Scr improved to 2.4. no LE edema. Continue to hold lasix. Probably restart lasix tomorrow.  05-22-2023 stable for DC to SNF. Due to CKD, not on ARB/ACEI or entresto.  Restart lasix at 20 mg every other day.  And prn LE edema. Continue Toprol-XL 25 mg qday.  Bilateral pleural effusion 05-18-2023 pt lying flat in bed. No edema. Will decide tomorrow if thoracentesis would be warranted. At this point, I don't think so since he is on RA and lying flat in bed. 05-19-2023 on RA and lying flat in bed. Risks of thoracentesis not worth it as he is on RA and able to lie flat in bed.  05-20-2023 discussed with pt's son Franky Macho. Thoracentesis too risky as pt is on RA and able to lie flat in bed. 05-21-2023 stable. No thoracentesis as pt is asymptomatic. On RA. Able to lie flat in bed.  05-22-2023 on RA and able to lie flat in bed without any distress. Risks of thoracentesis outweigh any potential benefit  Multiple open wounds of foot 05-18-2023 present on admission. Will get wound care consult. 05-19-2023 awaiting  wound care consult.  05-22-2023 outpatient referral to podiatry for wound on the dorsal right foot with exposed tendon   DNR (do not resuscitate)/DNI(Do Not Intubate)   Hyperkalemia 05-18-2023 K 3.6 today. Was 5.2 yesterday. Resolved.  Elevated troponin I level 05-18-2023 not ACS. Likely due to acute CHF. 05-19-2023 stable  Dementia (HCC) 05-18-2023 chronic. 05-19-2023 chronic. 05-21-2023 stable. Will need SNF at discharge.  05-22-2023 stable  Stage 3b chronic kidney disease (HCC) - baseline Scr 1.2-1.5 05-18-2023 baseline Scr 1.2-1.5 05-19-2023 Scr increased to 2.13. consistent with AKI from overdiuresis. Hold lasix for now. Repeat BMP in AM. 02-16-205 Scr 2.87. likely some ATN from hypotensive episode 2 nights ago. BP is stable now. I don't think aggressively hydrating him is worth the risk of CHF exacerbation. Discussed with pt's son Franky Macho who is 10 y/o and pt's sole caregiver. Discussed that pt is not a HD candidate. He agrees. 05-21-2023 Scr down to 2.4 today.  05-22-2023 stable  Wound, open, foot with tendon involvement, right, initial encounter 05-20-2023. Present on admission Scaly skin noted on the dorsal left foot; however has a wound on the dorsal right foot with exposed tendon. He is not followed that I can tell by podiatry or wound care center.  Wound type: full thickness ulceration left foot  Pressure Injury POA: NA Measurement: see nursing flow sheets  Wound RJJ:OACZYS tendon centrally  Drainage (amount, consistency, odor) scant Periwound: intact  Dressing procedure/placement/frequency: Cleanse with saline, pat dry Apply hydrogel (Lawson# [phone]), top with dry dressing. Apply daily  Debility 05-20-2023. Son would like pt to go to SNF at discharge. [facility] is preferred place. Will place PT consult and TOC consult.  05-22-2023 DC to SNF. [facility] as requested by son Franky Macho.   Discharge Diagnoses:  Principal Problem:   Acute on chronic systolic CHF  (congestive heart failure) (HCC) Active Problems:   Bilateral pleural effusion   Stage 3b chronic kidney disease (HCC) - baseline Scr 1.2-1.5   Dementia (HCC)   Elevated troponin I level   Hyperkalemia   DNR (do not resuscitate)/DNI(Do Not Intubate)   Multiple open wounds of foot   Debility   Wound, open, foot with tendon involvement, right, initial encounter  Discharge Instructions  Discharge Instructions     Ambulatory referral to Podiatry   Complete by: As directed    wound on the dorsal right foot with exposed tendon   Call MD for:  difficulty breathing, headache or visual disturbances   Complete by: As directed    Call MD for:  extreme fatigue   Complete by: As directed    Call MD for:  hives   Complete by: As directed    Call MD for:  persistant dizziness or light-headedness   Complete by: As directed    Call MD for:  persistant nausea and vomiting   Complete by: As directed    Call MD for:  redness, tenderness, or signs of infection (pain, swelling, redness, odor or green/yellow discharge around incision site)   Complete by: As directed    Call  MD for:  severe uncontrolled pain   Complete by: As directed    Call MD for:  temperature >100.4   Complete by: As directed    Diet renal with fluid restriction   Complete by: As directed    1800 ml/day fluid restriction   Discharge instructions   Complete by: As directed    1. Follow up with primary care provider in 1-2 weeks. 2. Outpatient podiatry referral for right foot wound will be made   Discharge wound care:   Complete by: As directed    Wound type:dorsal right foot with exposed tendon. Wound type: full thickness ulceration left foot  Pressure Injury POA: NA Measurement: see nursing flow sheets  Wound [id number]:YNWGNF tendon centrally  Drainage (amount, consistency, odor) scant Periwound: intact  Dressing procedure/placement/frequency: Cleanse with saline, pat dry Apply hydrogel (Lawson# [phone]), top with dry  dressing. Apply daily   Increase activity slowly   Complete by: As directed       Allergies as of 05/22/2023   No Known Allergies      Medication List     STOP taking these medications    fluticasone 50 MCG/ACT nasal spray Commonly known as: FLONASE   gabapentin 300 MG capsule Commonly known as: NEURONTIN   QUEtiapine 25 MG tablet Commonly known as: SEROQUEL       TAKE these medications    acetaminophen 650 MG CR tablet Commonly known as: TYLENOL Take 650-1,300 mg by mouth See admin instructions. Take 650 mg by mouth in the morning and 1300 mg by mouth at bedtime.   colchicine 0.6 MG tablet Take 0.6 mg by mouth at bedtime.   diclofenac Sodium 1 % Gel Commonly known as: VOLTAREN Apply 1 Application topically as needed (pain).   furosemide 20 MG tablet Commonly known as: LASIX Take 1 tablet (20 mg total) by mouth every other day. Prn LE edema or SOB What changed:  medication strength how much to take how to take this when to take this additional instructions   hydrocortisone 2.5 % rectal cream Commonly known as: ANUSOL-HC Apply 1 Application topically as needed for hemorrhoids or anal itching.   melatonin 3 MG Tabs tablet Take 2 tablets (6 mg total) by mouth at bedtime.   metoprolol succinate 25 MG 24 hr tablet Commonly known as: TOPROL-XL Take 1 tablet (25 mg total) by mouth daily. Take with or immediately following a meal. What changed:  when to take this additional instructions   MULTIVITAMIN ADULT PO Take 1 tablet by mouth daily.   potassium chloride SA 20 MEQ tablet Commonly known as: KLOR-CON M Take 1 tablet (20 mEq total) by mouth every other day. What changed: when to take this   tamsulosin 0.4 MG Caps capsule Commonly known as: FLOMAX Take 0.4 mg by mouth daily.               Discharge Care Instructions  (From admission, onward)           Start     Ordered   05/22/23 0000  Discharge wound care:       Comments: Wound  type:dorsal right foot with exposed tendon. Wound type: full thickness ulceration left foot  Pressure Injury POA: NA Measurement: see nursing flow sheets  Wound QMV:HQIONG tendon centrally  Drainage (amount, consistency, odor) scant Periwound: intact  Dressing procedure/placement/frequency: Cleanse with saline, pat dry Apply hydrogel (Lawson# [phone]), top with dry dressing. Apply daily   05/22/23 [phone]  No Known Allergies  Discharge Exam: Vitals:   05/21/23 2056 05/22/23 0538  BP: 119/72 122/70  Pulse: 79 71  Resp: 19 19  Temp: 98.5 F (36.9 C) 98.6 F (37 C)  SpO2: 97% 96%   Physical Exam Vitals and nursing note reviewed.  Constitutional:      General: He is not in acute distress.    Appearance: He is not toxic-appearing or diaphoretic.     Comments: Lying flat in bed. On RA. No distress  HENT:     Head: Normocephalic and atraumatic.  Cardiovascular:     Rate and Rhythm: Normal rate and regular rhythm.  Pulmonary:     Effort: Pulmonary effort is normal. No respiratory distress.     Breath sounds: Normal breath sounds.  Abdominal:     General: Bowel sounds are normal.     Palpations: Abdomen is soft.  Musculoskeletal:     Right lower leg: No edema.     Left lower leg: No edema.  Skin:    General: Skin is warm and dry.     Capillary Refill: Capillary refill takes less than 2 seconds.  Neurological:     Comments: Hard of hearing Pleasantly demented     The results of significant diagnostics from this hospitalization (including imaging, microbiology, ancillary and laboratory) are listed below for reference.     Labs: BNP (last 3 results) Recent Labs    03/27/23 0140 05/17/23 1723 05/19/23 0236  BNP 1,694.3* 2,307.1* 1,447.0*   Basic Metabolic Panel: Recent Labs  Lab 05/17/23 1723 05/18/23 0338 05/19/23 0236 05/20/23 0350 05/21/23 0354  NA 135 141 137 138 135  K 5.2* 3.6 4.5 4.9 4.5  CL 104 115* 104 103 103  CO2 24 20* 22 23 19*   GLUCOSE 67* 95 148* 118* 100*  BUN 44* 40* 45* 55* 56*  CREATININE 1.69* 1.28* 2.13* 2.87* 2.41*  CALCIUM 9.4 7.4* 9.2 9.1 8.9  MG  --  1.6* 2.0 1.9  --   PHOS  --  3.4  --   --   --    Liver Function Tests: Recent Labs  Lab 05/17/23 1723 05/19/23 0236 05/20/23 0350  AST 35 28 74*  ALT 25 21 22   ALKPHOS 72 62 56  BILITOT 0.8 1.2 1.3*  PROT 7.3 6.5 6.5  ALBUMIN 4.0 3.7 3.6   CBC: Recent Labs  Lab 05/17/23 1723 05/20/23 0350  WBC 3.0* 4.2  NEUTROABS 2.0 2.4  HGB 13.0 12.0*  HCT 43.0 39.2  MCV 106.4* 104.5*  PLT 78* 77*   BNP: Recent Labs  Lab 05/17/23 1723 05/19/23 0236  BNP 2,307.1* 1,447.0*   CBG: Recent Labs  Lab 05/19/23 0127  GLUCAP 173*   Urinalysis    Component Value Date/Time   COLORURINE YELLOW 12/16/2021 1229   APPEARANCEUR CLEAR 12/16/2021 1229   LABSPEC 1.019 12/16/2021 1229   PHURINE 5.0 12/16/2021 1229   GLUCOSEU NEGATIVE 12/16/2021 1229   HGBUR SMALL (A) 12/16/2021 1229   BILIRUBINUR NEGATIVE 12/16/2021 1229   KETONESUR NEGATIVE 12/16/2021 1229   PROTEINUR NEGATIVE 12/16/2021 1229   UROBILINOGEN 0.2 01/19/2020 1614   NITRITE NEGATIVE 12/16/2021 1229   LEUKOCYTESUR NEGATIVE 12/16/2021 1229   Sepsis Labs Recent Labs  Lab 05/17/23 1723 05/20/23 0350  WBC 3.0* 4.2   Procedures/Studies: DG Chest 2 View Result Date: 05/17/2023 CLINICAL DATA:  Shortness of breath EXAM: CHEST - 2 VIEW COMPARISON:  03/27/2023 FINDINGS: Moderate bilateral effusions. Cardiomegaly with vascular congestion. Bibasilar consolidations. IMPRESSION: Cardiomegaly with vascular  congestion and moderate bilateral effusions. Bibasilar consolidations may be due to atelectasis or pneumonia. Electronically Signed   By: Jasmine Pang M.D.   On: 05/17/2023 18:58   Time coordinating discharge: 45 mins  SIGNED:  Carollee Herter, DO [facility] 05/22/23, 10:02 AM

## 2023-05-22 NOTE — TOC Transition Note (Signed)
Transition of Care [facility]) - Discharge Note   Patient Details  Name: John Rose MRN: [id number] Date of Birth: 03-12-1923  [facility] [place] CM/SW Contact:  Larrie Kass, LCSW Phone Number: 05/22/2023, 9:58 AM   Clinical Narrative:    Pt to d/c to [facility] [place], attempted to call p's son back no response left VM informing him of transfer. RN to call [phone]. [facility] called, TOC sign off.   Final next level of care: Skilled Nursing Facility Barriers to Discharge: Barriers Resolved   Patient Goals and CMS Choice Patient states their goals for this hospitalization and ongoing recovery are:: snf to get stonger          Discharge Placement              Patient chooses bed at: [facility], [place] Patient to be transferred to facility by: EMS Name of family member notified: Shlomo, Seres (Son)  [phone] (Mobile Patient and family notified of of transfer: 05/22/23  Discharge Plan and Services Additional resources added to the After Visit Summary for                                       Social Drivers of Health (SDOH) Interventions SDOH Screenings   Food Insecurity: No Food Insecurity (05/18/2023)  Housing: Low Risk  (05/18/2023)  Transportation Needs: No Transportation Needs (05/18/2023)  Utilities: Not At Risk (05/18/2023)  Social Connections: Moderately Isolated (05/18/2023)  Tobacco Use: Low Risk  (05/17/2023)     Readmission Risk Interventions     No data to display

## 2023-05-22 NOTE — TOC Progression Note (Addendum)
Transition of Care [facility]) - Progression Note    Patient Details  Name: Cobey Raineri MRN: [id number] Date of Birth: May 27, 1922  Transition of Care [facility]) CM/SW Contact  Larrie Kass, LCSW Phone Number: 05/22/2023, 8:56 AM  Clinical Narrative:     Bed offers was presented to the pt's son. CSW attempted to contact [facility] but received no answer. A text message was sent inquiring about placement. CSW also emailed a list of other facilities that were able to offer the pt a bed to the son's email.  Pt's son would like to wait to hear back from [facility] before making a decision. TOC to follow.    Expected Discharge Plan and Services                                               Social Determinants of Health (SDOH) Interventions SDOH Screenings   Food Insecurity: No Food Insecurity (05/18/2023)  Housing: Low Risk  (05/18/2023)  Transportation Needs: No Transportation Needs (05/18/2023)  Utilities: Not At Risk (05/18/2023)  Social Connections: Moderately Isolated (05/18/2023)  Tobacco Use: Low Risk  (05/17/2023)    Readmission Risk Interventions     No data to display

## 2023-05-28 ENCOUNTER — Ambulatory Visit: Payer: Medicare Other | Admitting: Podiatry

## 2023-05-28 ENCOUNTER — Ambulatory Visit (INDEPENDENT_AMBULATORY_CARE_PROVIDER_SITE_OTHER): Payer: Medicare Other | Admitting: Podiatry

## 2023-05-28 ENCOUNTER — Encounter: Payer: Self-pay | Admitting: Podiatry

## 2023-05-28 DIAGNOSIS — I739 Peripheral vascular disease, unspecified: Secondary | ICD-10-CM

## 2023-05-28 DIAGNOSIS — L97511 Non-pressure chronic ulcer of other part of right foot limited to breakdown of skin: Secondary | ICD-10-CM

## 2023-05-28 DIAGNOSIS — L97522 Non-pressure chronic ulcer of other part of left foot with fat layer exposed: Secondary | ICD-10-CM

## 2023-05-28 NOTE — Progress Notes (Unsigned)
 Subjective:   Patient ID: John Rose, male   DOB: 89 y.o.   MRN: [id number]   HPI Chief Complaint  Patient presents with   Wound Check    RM#11 Right foot wound care dressing changed 05/27/2023 at nursing home.   89 year old male presents To the office  for follow-up evaluation for an ulcer on the top of his left foot which been ongoing for some time.  Recently hospitalized for unrelated issue and he presents today for follow-up evaluation.  The patient states he has not seen the wound recently but previously the wound was getting better and was smaller.  He is not sure how it started.  Does not report any fever or chills.  Review of Systems  All other systems reviewed and are negative.  Past Medical History:  Diagnosis Date   Lumbar spondylosis 11/10/2016   Testosterone deficiency     Past Surgical History:  Procedure Laterality Date   NO PAST SURGERIES       Current Outpatient Medications:    acetaminophen (TYLENOL) 650 MG CR tablet, Take 650-1,300 mg by mouth See admin instructions. Take 650 mg by mouth in the morning and 1300 mg by mouth at bedtime., Disp: , Rfl:    colchicine 0.6 MG tablet, Take 0.6 mg by mouth at bedtime., Disp: , Rfl:    diclofenac Sodium (VOLTAREN) 1 % GEL, Apply 1 Application topically as needed (pain)., Disp: , Rfl:    furosemide (LASIX) 20 MG tablet, Take 1 tablet (20 mg total) by mouth every other day. Prn LE edema or SOB, Disp: , Rfl:    hydrocortisone (ANUSOL-HC) 2.5 % rectal cream, Apply 1 Application topically as needed for hemorrhoids or anal itching., Disp: , Rfl:    melatonin 3 MG TABS tablet, Take 2 tablets (6 mg total) by mouth at bedtime., Disp: , Rfl:    metoprolol succinate (TOPROL-XL) 25 MG 24 hr tablet, Take 1 tablet (25 mg total) by mouth daily. Take with or immediately following a meal. (Patient taking differently: Take 25 mg by mouth at bedtime.), Disp: , Rfl:    Multiple Vitamin (MULTIVITAMIN ADULT PO), Take 1 tablet by mouth daily.,  Disp: , Rfl:    potassium chloride SA (KLOR-CON M) 20 MEQ tablet, Take 1 tablet (20 mEq total) by mouth every other day., Disp: , Rfl:    tamsulosin (FLOMAX) 0.4 MG CAPS capsule, Take 0.4 mg by mouth daily. , Disp: , Rfl: 0  No Known Allergies        Objective:  Physical Exam  General: NAD  Dermatological: On the dorsal aspect of the left midfoot has an ulcer which is flexible.  Small exposed tendon is noted.  There is no swelling erythema, ascending cellulitis.  No fluctuation or palpitation.  No nausea.  On the dorsal aspect the right foot Second Toe There Was Some Superficial Area of Skin Breakdown.  There Is No Drainage or Pus.  No Fluctuation with Palpation There Is No Nausea.       Vascular: Dorsalis Pedis artery and Posterior Tibial artery pedal pulses are decreased bilateral with immedate capillary fill time.   Neruologic: Sensation decreased.  Musculoskeletal: Mild discomfort to palpation deep in the wound of the left midfoot.  No other areas of discomfort.  He is in a wheelchair.     Assessment:   Chronic ulceration     Plan:  -Treatment options discussed including all alternatives, risks, and complications -Etiology of symptoms were discussed -I reviewed the hospital records as  well as the x-ray that was done in December.  Wound appears to be stable.  Continue with daily dressing changes with hydrogel.  Offloading.  They can do this on both feet.  Monitoring signs or symptoms of infection.  I did order an ABI given decreased circulation  Vivi Barrack DPM

## 2023-06-05 ENCOUNTER — Telehealth: Payer: Self-pay | Admitting: Podiatry

## 2023-06-05 NOTE — Telephone Encounter (Signed)
 Shaheed Schmuck Male, 89 y.o., 1922-06-11 [phone] MRN: [id number] Are you able to check the status of referral         Diagnosis: I73.9 (ICD-10-CM) - PAD (peripheral artery disease) (HCC) Procedures: [id number] - VAS [place] ABI WITH/WO TBI Start: May 28, 2023 Expiration: May 27, 2024 Cardiology Procedure

## 2023-06-14 ENCOUNTER — Ambulatory Visit (HOSPITAL_COMMUNITY)
Admission: RE | Admit: 2023-06-14 | Discharge: 2023-06-14 | Disposition: A | Discharge status: Home / Self Care | Source: Ambulatory Visit | Attending: Podiatry | Admitting: Podiatry

## 2023-06-14 DIAGNOSIS — I739 Peripheral vascular disease, unspecified: Secondary | ICD-10-CM | POA: Diagnosis not present

## 2023-06-14 LAB — VAS US ABI WITH/WO TBI
Left ABI: 0.68
Right ABI: 0.95

## 2023-06-18 ENCOUNTER — Other Ambulatory Visit: Payer: Self-pay | Admitting: Podiatry

## 2023-06-18 DIAGNOSIS — L97522 Non-pressure chronic ulcer of other part of left foot with fat layer exposed: Secondary | ICD-10-CM

## 2023-06-18 DIAGNOSIS — L97511 Non-pressure chronic ulcer of other part of right foot limited to breakdown of skin: Secondary | ICD-10-CM

## 2023-06-18 DIAGNOSIS — I739 Peripheral vascular disease, unspecified: Secondary | ICD-10-CM

## 2023-06-22 ENCOUNTER — Telehealth: Payer: Self-pay

## 2023-06-22 NOTE — Telephone Encounter (Signed)
 Patient is scheduled on 06/28/23

## 2023-06-22 NOTE — Telephone Encounter (Signed)
-----   Message from Vivi Barrack sent at 06/18/2023  5:18 PM EDT ----- Blood flow and she needs to be seen by vascular surgery.  An order was placed.  Can you call to see if we get her in ASAP?  Thank you.

## 2023-06-27 NOTE — Progress Notes (Unsigned)
 Office Note     CC: Bilateral lower extremity foot wounds with depressed ABIs Requesting Provider:  Vivi Barrack, DPM  HPI: Mase Dhondt is a 89 y.o. ([birth year]) male presenting at the request of .Chandrasekhar, Mahesh A, MD for nonhealing left dorsal foot wound.  On exam, Clovis was accompanied by his son.  A native of [place] in [place], he moved to [place] years ago to be closer to family.  He works for the [place] [employer], and still collects pension.  He presented today in a wheelchair with [facility] lift underneath.  Per his son, this is only done for transportation purposes.  He lives at a long-term care facility, and (walks) intermittently, however his son has not seen him do so in quite some time. He spends the majority of the day in the dependent position. History was limited due to baseline dementia.  Comorbidities include heart failure with ejection fraction of 20%, CKD 3b.   Past Medical History:  Diagnosis Date   Lumbar spondylosis 11/10/2016   Testosterone deficiency     Past Surgical History:  Procedure Laterality Date   NO PAST SURGERIES      Social History   Socioeconomic History   Marital status: Single    Spouse name: Not on file   Number of children: 1   Years of education: BS   Highest education level: Not on file  Occupational History   Occupation: Retired  Tobacco Use   Smoking status: Never   Smokeless tobacco: Never  Vaping Use   Vaping status: Never Used  Substance and Sexual Activity   Alcohol use: No   Drug use: No   Sexual activity: Not on file  Other Topics Concern   Not on file  Social History Narrative   Lives    Caffeine use: Tea, soda (pepsi)   Social Drivers of Health   Financial Resource Strain: Not on file  Food Insecurity: No Food Insecurity (05/18/2023)   Hunger Vital Sign    Worried About Running Out of Food in the Last Year: Never true    Ran Out of Food in the Last Year: Never true   Transportation Needs: No Transportation Needs (05/18/2023)   PRAPARE - Administrator, Civil Service (Medical): No    Lack of Transportation (Non-Medical): No  Physical Activity: Not on file  Stress: Not on file  Social Connections: Moderately Isolated (05/18/2023)   Social Connection and Isolation Panel [NHANES]    Frequency of Communication with Friends and Family: Twice a week    Frequency of Social Gatherings with Friends and Family: Twice a week    Attends Religious Services: Never    Database administrator or Organizations: Yes    Attends Banker Meetings: Never    Marital Status: Widowed  Intimate Partner Violence: Not At Risk (05/18/2023)   Humiliation, Afraid, Rape, and Kick questionnaire    Fear of Current or Ex-Partner: No    Emotionally Abused: No    Physically Abused: No    Sexually Abused: No   Family History  Problem Relation Age of Onset   Hypertension Other     Current Outpatient Medications  Medication Sig Dispense Refill   acetaminophen (TYLENOL) 650 MG CR tablet Take 650-1,300 mg by mouth See admin instructions. Take 650 mg by mouth in the morning and 1300 mg by mouth at bedtime.     colchicine 0.6 MG tablet Take 0.6 mg by mouth at bedtime.  diclofenac Sodium (VOLTAREN) 1 % GEL Apply 1 Application topically as needed (pain).     furosemide (LASIX) 20 MG tablet Take 1 tablet (20 mg total) by mouth every other day. Prn LE edema or SOB     hydrocortisone (ANUSOL-HC) 2.5 % rectal cream Apply 1 Application topically as needed for hemorrhoids or anal itching.     melatonin 3 MG TABS tablet Take 2 tablets (6 mg total) by mouth at bedtime.     metoprolol succinate (TOPROL-XL) 25 MG 24 hr tablet Take 1 tablet (25 mg total) by mouth daily. Take with or immediately following a meal. (Patient taking differently: Take 25 mg by mouth at bedtime.)     Multiple Vitamin (MULTIVITAMIN ADULT PO) Take 1 tablet by mouth daily.     potassium chloride SA  (KLOR-CON M) 20 MEQ tablet Take 1 tablet (20 mEq total) by mouth every other day.     tamsulosin (FLOMAX) 0.4 MG CAPS capsule Take 0.4 mg by mouth daily.   0   No current facility-administered medications for this visit.    No Known Allergies   REVIEW OF SYSTEMS:  [X]  denotes positive finding, [ ]  denotes negative finding Cardiac  Comments:  Chest pain or chest pressure:    Shortness of breath upon exertion:    Short of breath when lying flat:    Irregular heart rhythm:        Vascular    Pain in calf, thigh, or hip brought on by ambulation:    Pain in feet at night that wakes you up from your sleep:     Blood clot in your veins:    Leg swelling:         Pulmonary    Oxygen at home:    Productive cough:     Wheezing:         Neurologic    Sudden weakness in arms or legs:     Sudden numbness in arms or legs:     Sudden onset of difficulty speaking or slurred speech:    Temporary loss of vision in one eye:     Problems with dizziness:         Gastrointestinal    Blood in stool:     Vomited blood:         Genitourinary    Burning when urinating:     Blood in urine:        Psychiatric    Major depression:         Hematologic    Bleeding problems:    Problems with blood clotting too easily:        Skin    Rashes or ulcers:        Constitutional    Fever or chills:      PHYSICAL EXAMINATION:  There were no vitals filed for this visit.  General:  WDWN in NAD; vital signs documented above Gait: Not observed HENT: WNL, normocephalic Pulmonary: normal non-labored breathing , without wheezing Cardiac: regular HR Abdomen: soft, NT, no masses Skin: without rashes Vascular Exam/Pulses:  Right Left  Radial    Ulnar    Femoral    Popliteal    DP trace trace  PT phasic    Extremities: with ischemic changes, without Gangrene , without cellulitis; with open wounds;  Musculoskeletal: no muscle wasting or atrophy  Neurologic: A&O X 3;  No focal weakness or  paresthesias are detected Psychiatric:  The pt has Normal affect.   Non-Invasive Vascular  Imaging:     ABI Findings:  +---------+------------------+-----+----------+--------+  Right   Rt Pressure (mmHg)IndexWaveform  Comment   +---------+------------------+-----+----------+--------+  Brachial 164                                        +---------+------------------+-----+----------+--------+  PTA     162               0.95 monophasicdampened  +---------+------------------+-----+----------+--------+  DP      150               0.88 monophasicdampened  +---------+------------------+-----+----------+--------+  Great Toe                                 absent    +---------+------------------+-----+----------+--------+   +---------+------------------+-----+----------+-------+  Left    Lt Pressure (mmHg)IndexWaveform  Comment  +---------+------------------+-----+----------+-------+  Brachial 171                                       +---------+------------------+-----+----------+-------+  PTA                            absent             +---------+------------------+-----+----------+-------+  PERO                           absent             +---------+------------------+-----+----------+-------+  DP      116               0.68 monophasic         +---------+------------------+-----+----------+-------+  Great Toe                                 absent   +---------+------------------+-----+----------+-------+   +-------+-----------+-----------+------------+------------+  ABI/TBIToday's ABIToday's TBIPrevious ABIPrevious TBI  +-------+-----------+-----------+------------+------------+  Right 0.95       absent                               +-------+-----------+-----------+------------+------------+  Left  0.68       absent                                +-------+-----------+-----------+------------+------------+     ASSESSMENT/PLAN: Kolin Erdahl is a 89 y.o. male presenting with wound on the dorsal aspect of the left foot.  Per patient and his son, the wound has been healing slowly.  There is some concern that healing has stagnated.  On physical exam, fatty tissue layer was exposed in the wound bed.  The wound bed did appear healthy.  No concerns for infection. Monophasic dorsalis pedis signal. Edema in bilateral lower extremities. ABI was reviewed demonstrating depressed waveforms bilaterally with nonexistent toe pressures.  There is some question of accuracy due to patient movement during the exam, however the study was consistent with my findings with Doppler ultrasound.    We had a long conversation regarding Trajon, his age, and other comorbidities.  I do not think he is  an open surgical candidate.  We discussed continued medical management versus attempted endovascular intervention.  I was very clear that endovascular intervention could result in dialysis, which would be a terminal diagnosis at 89 years old.  Unfortunately, I think that he is in a tough spot as the wound will likely not heal without intervention.  The wound, when compared to pictures 1 month ago looks the same, possibly with a little bit deeper ulceration.  After discussing the risks and benefits of endovascular intervention versus continued medical management with emphasis on elevation, and wound care, his son elected to pursue continued conservative management.  I think this is very reasonable giving the patient's age and comorbidities.  With an ejection fraction of 20%, I think any intervention requiring stenting would likely fail.  He was honest that even with wound worsening, they would not pursue amputation.  My plan is to see Sadarius in 3 months time.  I gave his son my card, and asked him to call should any questions or concerns arise. I think there is a high  likelihood that at his next visit, endovascular intervention may be taken off the table completely due to continued health decline.    Victorino Sparrow, MD [facility] [phone] Total time of patient care including pre-visit research, consultation, and documentation greater than 60 minutes

## 2023-06-28 ENCOUNTER — Ambulatory Visit (INDEPENDENT_AMBULATORY_CARE_PROVIDER_SITE_OTHER): Admitting: Vascular Surgery

## 2023-06-28 ENCOUNTER — Encounter: Payer: Self-pay | Admitting: Vascular Surgery

## 2023-06-28 VITALS — BP 88/60 | HR 98 | Temp 97.3°F | Resp 20 | Ht 70.0 in | Wt 164.0 lb

## 2023-06-28 DIAGNOSIS — S96901A Unspecified injury of unspecified muscle and tendon at ankle and foot level, right foot, initial encounter: Secondary | ICD-10-CM

## 2023-06-28 DIAGNOSIS — S91301A Unspecified open wound, right foot, initial encounter: Secondary | ICD-10-CM | POA: Diagnosis not present

## 2023-06-28 DIAGNOSIS — N1832 Chronic kidney disease, stage 3b: Secondary | ICD-10-CM

## 2023-06-28 DIAGNOSIS — I70245 Atherosclerosis of native arteries of left leg with ulceration of other part of foot: Secondary | ICD-10-CM

## 2023-07-03 DEATH — deceased

## 2023-07-16 ENCOUNTER — Ambulatory Visit: Payer: Medicare Other | Admitting: Nurse Practitioner

## 2023-09-27 ENCOUNTER — Ambulatory Visit: Admitting: Vascular Surgery
